# Patient Record
Sex: Female | Born: 1940
Health system: Southern US, Community
[De-identification: ages and names within clinical notes are randomized; demographics above are authoritative.]

## PROBLEM LIST (undated history)

## (undated) DIAGNOSIS — E785 Hyperlipidemia, unspecified: Secondary | ICD-10-CM

## (undated) DIAGNOSIS — Z91018 Allergy to other foods: Secondary | ICD-10-CM

## (undated) DIAGNOSIS — I10 Essential (primary) hypertension: Secondary | ICD-10-CM

## (undated) DIAGNOSIS — G039 Meningitis, unspecified: Secondary | ICD-10-CM

## (undated) HISTORY — PX: FOOT SURGERY: SHX648

## (undated) HISTORY — DX: Hyperlipidemia, unspecified: E78.5

## (undated) HISTORY — PX: BACK SURGERY: SHX140

## (undated) HISTORY — PX: EYE SURGERY: SHX253

## (undated) HISTORY — DX: Essential (primary) hypertension: I10

## (undated) HISTORY — DX: Meningitis, unspecified: G03.9

## (undated) HISTORY — DX: Allergy to other foods: Z91.018

---

## 1972-04-23 HISTORY — PX: TUBAL LIGATION: SHX77

## 1990-04-23 HISTORY — PX: FOOT SURGERY: SHX648

## 1992-04-23 HISTORY — PX: VESICOVAGINAL FISTULA CLOSURE W/ TAH: SUR271

## 2004-03-06 HISTORY — PX: OTHER SURGICAL HISTORY: SHX169

## 2008-04-23 HISTORY — PX: VITRECTOMY: SHX106

## 2009-02-18 HISTORY — PX: CHOLECYSTECTOMY: SHX55

## 2009-04-23 HISTORY — PX: CATARACT EXTRACTION: SUR2

## 2009-07-13 ENCOUNTER — Ambulatory Visit: Payer: Self-pay | Admitting: Pulmonary Disease

## 2009-07-13 DIAGNOSIS — J984 Other disorders of lung: Secondary | ICD-10-CM | POA: Insufficient documentation

## 2009-07-13 DIAGNOSIS — I10 Essential (primary) hypertension: Secondary | ICD-10-CM

## 2009-07-13 DIAGNOSIS — E785 Hyperlipidemia, unspecified: Secondary | ICD-10-CM

## 2009-07-13 HISTORY — DX: Essential (primary) hypertension: I10

## 2009-07-15 ENCOUNTER — Telehealth: Payer: Self-pay | Admitting: Internal Medicine

## 2009-07-20 ENCOUNTER — Ambulatory Visit (HOSPITAL_COMMUNITY): Admission: RE | Admit: 2009-07-20 | Discharge: 2009-07-20 | Payer: Self-pay | Admitting: Pulmonary Disease

## 2009-07-22 ENCOUNTER — Ambulatory Visit: Payer: Self-pay | Admitting: Pulmonary Disease

## 2009-07-22 DIAGNOSIS — R0602 Shortness of breath: Secondary | ICD-10-CM | POA: Insufficient documentation

## 2009-07-22 DIAGNOSIS — J209 Acute bronchitis, unspecified: Secondary | ICD-10-CM | POA: Insufficient documentation

## 2009-10-06 ENCOUNTER — Telehealth: Payer: Self-pay | Admitting: Pulmonary Disease

## 2009-10-25 ENCOUNTER — Encounter: Payer: Self-pay | Admitting: Pulmonary Disease

## 2009-11-02 ENCOUNTER — Encounter: Payer: Self-pay | Admitting: Pulmonary Disease

## 2009-12-06 ENCOUNTER — Encounter: Payer: Self-pay | Admitting: Critical Care Medicine

## 2009-12-09 ENCOUNTER — Ambulatory Visit: Payer: Self-pay | Admitting: Cardiology

## 2009-12-12 ENCOUNTER — Ambulatory Visit: Payer: Self-pay | Admitting: Pulmonary Disease

## 2009-12-15 DIAGNOSIS — E049 Nontoxic goiter, unspecified: Secondary | ICD-10-CM

## 2009-12-15 HISTORY — DX: Nontoxic goiter, unspecified: E04.9

## 2010-05-01 ENCOUNTER — Ambulatory Visit: Payer: Self-pay | Admitting: Cardiology

## 2010-05-01 DIAGNOSIS — R072 Precordial pain: Secondary | ICD-10-CM

## 2010-05-14 ENCOUNTER — Encounter: Payer: Self-pay | Admitting: Pulmonary Disease

## 2010-05-17 ENCOUNTER — Ambulatory Visit
Admission: RE | Admit: 2010-05-17 | Discharge: 2010-05-17 | Payer: Self-pay | Source: Home / Self Care | Attending: Pulmonary Disease | Admitting: Pulmonary Disease

## 2010-05-23 ENCOUNTER — Ambulatory Visit
Admission: RE | Admit: 2010-05-23 | Discharge: 2010-05-23 | Payer: Self-pay | Source: Home / Self Care | Attending: Pulmonary Disease | Admitting: Pulmonary Disease

## 2010-05-23 NOTE — Letter (Signed)
Summary: [facility]   Imported By: Sherian Rein 11/01/2009 09:13:15  _____________________________________________________________________  External Attachment:    Type:   Image     Comment:   External Document

## 2010-05-23 NOTE — Procedures (Signed)
Summary: EGD/[facility]  EGD/[facility]   Imported By: Sherian Rein 01/10/2010 11:03:36  _____________________________________________________________________  External Attachment:    Type:   Image     Comment:   External Document

## 2010-05-23 NOTE — Assessment & Plan Note (Signed)
Summary: rov after ct/apc   Visit Type:  Follow-up Copy to:  Dr. Lucila Maine Primary Provider/Referring Provider:  Dr. Lucila Maine  CC:  Pt here for follow up after CT.  History of Present Illness: Initial OV 07/13/09 69/F referred for evaluation of pulmonary nodule. I have reviewed all images - CXRs dated 7/03, 10/06, 11/07, 10/10 (gall bladder surgery) which are essentially nml. She reports dyspnea x 1 year especially on climbing  asteep hill. CXr on 2/21 /11 performed for this reason showed a RUL nodular infiltrate. Ct chest in 3/11 ([place]) showed 2.7 x 2.1 cm RUl spiculated ill defined nodular infiltrate . She denies fevers, weight loss, hemoptysis or viral prodrome. She reports occasional cough with white phlegm that she has attributed to allergies. She has been on nitrofurantoin for at least a year after bladder tack surgery.  July 22, 2009 9:17 AM  Pet scan reviewed >> low SUV 3.0 uptake. Pulm arteries & heart appear enlarged. Reports neg cath in 2005  c/o cough with yellow phlegm, runny nose, sinus headaches -->  azithro  December 12, 2009 1:51 PM  No new symptoms, exercise cardiac stress test neg for ischemia 7/11. Reviewed CT chest - RUL mucoid impaction unchanged - new ground glass nodule 1.0 x 0.7 cm LUL, nodular thyroid  Preventive Screening-Counseling & Management  Alcohol-Tobacco     Smoking Status: never  Current Medications (verified): 1)  Aspirin 81 Mg  Tabs (Aspirin) .... Take 1 Tablet By Mouth Once A Day 2)  Simvastatin 20 Mg Tabs (Simvastatin) .... Take 1 Tab By Mouth At Bedtime 3)  Omeprazole 40 Mg Cpdr (Omeprazole) .... Take 1 Tablet By Mouth Two Times A Day 4)  Tylenol Extra Strength 500 Mg Tabs (Acetaminophen) .... As Needed For Pain 5)  Klor-Con M10 10 Meq Cr-Tabs (Potassium Chloride Crys Cr) .... Take 1 Tablet By Mouth Once A Day 6)  Triamterene-Hctz 37.5-25 Mg Tabs (Triamterene-Hctz) .... Take 1 Tablet By Mouth Once A Day 7)  Flomax 0.4 Mg Caps  (Tamsulosin Hcl) .... Take 1 Tablet By Mouth Once A Day 8)  Nitrofurantoin Macrocrystal 100 Mg Caps (Nitrofurantoin Macrocrystal) .... Take 1 Tab By Mouth At Bedtime 9)  Co-Enzyme Q-10 100 Mg Caps (Coenzyme Q10) .... Take 1 Tablet By Mouth Two Times A Day 10)  Multivitamins   Tabs (Multiple Vitamin) .... Take 1 Tablet By Mouth Once A Day 11)  Calcium 600+d 600-200 Mg-Unit Tabs (Calcium Carbonate-Vitamin D) .... Take 1 Tablet By Mouth Once A Day 12)  35 Billion Probiotic .... Take 1 Tablet By Mouth Once A Day 13)  Super B Complex  Tabs (B Complex-C) .... Take 1 Tablet By Mouth Once A Day 14)  Metamucil Smooth Texture 58.6 % Powd (Psyllium) .Marland Kitchen.. 1tablespoon Daily 15)  Estrace 0.1 Mg/gm Crea (Estradiol) .... Monday, Wed, Friday  Allergies: 1)  ! Biaxin 2)  ! * Tequin 3)  ! Betadine 4)  ! Iodine 5)  ! 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   Review of Systems  The patient denies anorexia, fever, weight loss, weight gain, vision loss, decreased hearing, hoarseness, chest pain, syncope, dyspnea on exertion, peripheral edema, prolonged cough, headaches, hemoptysis, abdominal pain, melena, hematochezia, severe indigestion/heartburn, hematuria, muscle weakness, suspicious skin lesions, difficulty walking, depression, unusual weight change, and abnormal bleeding.  Vital Signs:  Patient profile:   70 year old female Height:      65.5 inches Weight:      169 pounds BMI:     27.80 O2 Sat:      98 % on Room air Temp:     97.9 degrees F oral Pulse rate:   84 / minute BP sitting:   148 / 70  (right arm) Cuff size:   regular  Vitals Entered By: Zackery Barefoot CMA (December 12, 2009 1:27 PM)  O2 Flow:  Room air  CC: Pt here for follow  up after CT Comments Medications reviewed with patient Verified contact number and pharmacy with patient Zackery Barefoot CMA  December 12, 2009 1:27 PM    Physical Exam  Additional Exam:  Gen. Pleasant, well-nourished, in no distress, normal affect ENT - no lesions, no post nasal drip Neck: No JVD, no thyromegaly, no carotid bruits Lungs: no use of accessory muscles, no dullness to percussion, clear without rales or rhonchi  Cardiovascular: Rhythm regular, heart sounds  normal, no murmurs or gallops, no peripheral edema Musculoskeletal: No deformities, no cyanosis or clubbing      CT of Chest  Procedure date:  12/09/2009  Findings:      IMPRESSION:   1.  No interval change in the tubular/raising opacity in the right upper lobe with a 'finger and left' appearance.  This may be seen with mucoid impaction and/or ABPA (Aspergillus) infection. 2.  Interval enlargement of a ground-glass nodule in the left upper lobe which is concerning for malignancy.  If a biopsy is not planned, a follow-up chest CT is recommended in 3 months. No adenopathy is seen. 3.  Hiatal hernia. 4. Unchanged nodular appearance of the thyroid.  Impression & Recommendations:  Problem # 1:  PULMONARY NODULE (ICD-518.89) Unclear significane of new lUL nodule in tihs never smoker - this is too small to be biopsied & best approach would be wait & reassess - if symptomatic may consider bronchoscopy with BAL. Likely same process as in RUL causing mucoid impaction Orders: Radiology Referral (Radiology) Est. Patient Level III (21308)  Problem # 2:  DYSPNEA (ICD-786.05) spirometry s/o mild restriction rather than obstruction. Wonder if we have to [facility] for PAH - echo done by cards & told nml  Problem # 3:  GOITER, UNSPECIFIED (ICD-240.9)  Nodular thyroid noted on CT - defer WU to PMD  Orders: Est. Patient Level III (65784)  Medications Added to Medication List This Visit: 1)  Aspirin 81 Mg Tabs  (Aspirin) .... Take 1 tablet by mouth once a day 2)  Omeprazole 40 Mg Cpdr (Omeprazole) .... Take 1 tablet by mouth two times a day 3)  Metamucil Smooth Texture 58.6 % Powd (Psyllium) .Marland Kitchen.. 1tablespoon daily  Patient Instructions: 1)  Copy sent to:dr scott 2)  Please schedule a follow-up appointment in 4 months after CT scan 3)  A Chest CT WITHOUT Contrast has been recommended in 4 months . Your imaging study may require preauthorization.  4)  You have a nodular thyroid. 5)  Call earlier if you have a fever or cough that won't go away.

## 2010-05-23 NOTE — Procedures (Signed)
Summary:  [facility]   Imported By: Sherian Rein 01/10/2010 11:04:41  _____________________________________________________________________  External Attachment:    Type:   Image     Comment:   External Document

## 2010-05-23 NOTE — Progress Notes (Signed)
Summary: FYI---name of cardiologists  Phone Note Call from Patient   Caller: Patient Call For: Denaya Horn Summary of Call: pt calling to give the names of heart drs which is dr Sherril Croon and dr Tomie China Initial call taken by: Rickard Patience,  October 06, 2009 9:29 AM  Follow-up for Phone Call        called and spoke with pt.  pt states this is just an FYI for Dr. Vassie Loll. Pt just wanted Dr. Vassie Loll to know who here cardiologists are.  Looks like per append to 07-22-2009 OV, RA was wanting cardiology records but pt at the time didn't know who she's seen regarding her heart. Will forward this message to Chryl Heck so she can get records.   Arman Filter LPN  October 06, 2009 9:34 AM   Additional Follow-up for Phone Call Additional follow up Details #1::        Called the above offices and requested notes. I was advised by both offices notes will be faxed. Zackery Barefoot CMA  October 27, 2009 4:32 PM   Dr. Sherril Croon [phone] Dr. Glean Hess Revankar [phone] Zackery Barefoot CMA  October 27, 2009 4:33 PM

## 2010-05-23 NOTE — Assessment & Plan Note (Signed)
Summary: FOLLOW UP/[facility]   Visit Type:  Follow-up Copy to:  Dr. Lucila Maine Primary Provider/Referring Provider:  Dr. Lucila Maine  CC:  Pt here for follow up on PET Scan. Pt c/o productive cough last night with white to yellow mucus and head congestion.  History of Present Illness: 68/F referred for evaluation of pulmonary nodule. I have reviewed all images - CXRs dated 7/03, 10/06, 11/07, 10/10 (gall bladder surgery) which are essentially nml. She reports dyspnea x 1 year especially on climbing  asteep hill. CXr on 2/21 /11 performed for this reason showed a RUL nodular infiltrate. Ct chest in 3/11 ([place]) showed 2.7 x 2.1 cm RUl spiculated ill defined nodular infiltrate . She denies fevers, weight loss, hemoptysis or viral prodrome. She reports occasional cough with white phlegm that she has attributed to allergies. She has been on nitrofurantoin for at least a year after bladder tack surgery.  July 22, 2009 9:17 AM  Pet scan reviewed >> low SUV 3.0 uptake. Pulm arteries & heart appear enlarged. Reports neg cath in 2005  c/o cough with yellow phlegm, runny nose, sinus headaches  Current Medications (verified): 1)  Aspirin 325 Mg  Tabs (Aspirin) .... Take One Tablet By Mouth Every Other Day 2)  Simvastatin 20 Mg Tabs (Simvastatin) .... Take 1 Tab By Mouth At Bedtime 3)  Omeprazole 40 Mg Cpdr (Omeprazole) .... Take 1 Tablet By Mouth Once A Day 4)  Tylenol Extra Strength 500 Mg Tabs (Acetaminophen) .... As Needed For Pain 5)  Klor-Con M10 10 Meq Cr-Tabs (Potassium Chloride Crys Cr) .... Take 1 Tablet By Mouth Once A Day 6)  Triamterene-Hctz 37.5-25 Mg Tabs (Triamterene-Hctz) .... Take 1 Tablet By Mouth Once A Day 7)  Flomax 0.4 Mg Caps (Tamsulosin Hcl) .... Take 1 Tablet By Mouth Once A Day 8)  Nitrofurantoin Macrocrystal 100 Mg Caps (Nitrofurantoin Macrocrystal) .... Take 1 Tab By Mouth At Bedtime 9)  Co-Enzyme Q-10 100 Mg Caps (Coenzyme Q10) .... Take 1 Tablet By Mouth Two Times A  Day 10)  Multivitamins   Tabs (Multiple Vitamin) .... Take 1 Tablet By Mouth Once A Day 11)  Calcium 600+d 600-200 Mg-Unit Tabs (Calcium Carbonate-Vitamin D) .... Take 1 Tablet By Mouth Once A Day 12)  35 Billion Probiotic .... Take 1 Tablet By Mouth Once A Day 13)  Super B Complex  Tabs (B Complex-C) .... Take 1 Tablet By Mouth Once A Day 14)  Metamucil Smooth Texture 58.6 % Powd (Psyllium) .Marland Kitchen.. 1 Scoop Daily 15)  Estrace 0.1 Mg/gm Crea (Estradiol) .... Monday, Wed, Friday 16)  Nasal Spray Saline .... Three Times A Day As Needed 17)  Furosemide 20 Mg Tabs (Furosemide) .... Take 1 Tablet By Mouth Every Morning As Needed 18)  Prednisone 10 Mg Tabs (Prednisone) .... Taper As Needed For Iv Dye (13 Hours Prior To Scan) 19)  Benadryl 25 Mg Caps (Diphenhydramine Hcl) .... Prior To Scan With Iv Dye  Allergies (verified): 1)  ! Biaxin 2)  ! * Tequin 3)  ! Betadine 4)  ! 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   Review of Systems       The patient complains of dyspnea on exertion.  The patient denies anorexia, fever, weight loss, weight gain, vision loss, decreased hearing, hoarseness, chest pain, syncope, peripheral edema, prolonged  cough, headaches, hemoptysis, abdominal pain, melena, hematochezia, severe indigestion/heartburn, hematuria, muscle weakness, suspicious skin lesions, difficulty walking, depression, unusual weight change, and abnormal bleeding.    Vital Signs:  Patient profile:   70 year old female Height:      65.5 inches Weight:      172.13 pounds O2 Sat:      98 % on Room air Temp:     98.0 degrees F oral Pulse rate:   90 / minute BP sitting:   140 / 86  (left arm) Cuff size:   regular  Vitals Entered By: Zackery Barefoot CMA  (July 22, 2009 9:07 AM)  O2 Flow:  Room air CC: Pt here for follow up on PET Scan. Pt c/o productive cough last night with white to yellow mucus, head congestion Comments Medications reviewed with patient Verified contact number and pharmacy with patient Zackery Barefoot CMA  July 22, 2009 9:08 AM    Physical Exam  Additional Exam:  Gen. Pleasant, well-nourished, in no distress, normal affect ENT - no lesions, no post nasal drip Neck: No JVD, no thyromegaly, no carotid bruits Lungs: no use of accessory muscles, no dullness to percussion, clear without rales or rhonchi  Cardiovascular: Rhythm regular, heart sounds  normal, no murmurs or gallops, no peripheral edema Musculoskeletal: No deformities, no cyanosis or clubbing      Imaging Exam  Procedure date:  07/20/2009  Findings:      IMPRESSION:   1.  There is mild hypermetabolism associated with linear soft tissue and volume loss in the right upper lobe.  A resolving infectious process is queried.  Comparison with old exams would be most helpful, as malignancy cannot be definitively excluded. 2.  Heterogeneous thyroid with diffuse but mildly patchy uptake. This can be seen with thyroiditis. 3.  Pulmonary arterial hypertension.  Pulmonary Function Test Date: 07/22/2009 09:31 AM Gender: Female  Pre-Spirometry FVC    Value: 2.35 L/min   % Pred: 71.80 % FEV1    Value: 1.76 L     Pred: 2.50 L     % Pred: 70.40 % FEV1/FVC  Value: 74.65 %     % Pred: 97.80 %  Impression & Recommendations:  Problem # 1:  PULMONARY NODULE (ICD-518.89) Low SUv on PET, would recommend serial FU in this never smoker Next CT in Orders: Radiology Referral (Radiology) Est. Patient Level III (40347) Spirometry w/Graph (94010)  Problem # 2:  BRONCHITIS, ACUTE (ICD-466.0)  sino- bronchitis Her updated medication list for this problem includes:    Azithromycin 500 Mg Tabs (Azithromycin) ..... Once daily  Orders: Est. Patient Level  III (42595) Spirometry w/Graph (94010)  Problem # 3:  DYSPNEA (ICD-786.05) spirometry s/o mild restriction rather than obstruction. Wonder if we have to [facility] for [facility]  Medications Added to Medication List This Visit: 1)  Azithromycin 500 Mg Tabs (Azithromycin) .... Once daily  Patient Instructions: 1)  Please schedule a follow-up appointment in 4 months after CT scan 2)  Antibiotic x 5 ds 3)  Breathing test 4)  Copy sent to: Dr Lucila Maine, Rosalita Levan Prescriptions: AZITHROMYCIN 500 MG TABS (AZITHROMYCIN) once daily  #5 x 0   Entered and Authorized by:   Comer Locket Vassie Loll MD   Signed by:   Comer Locket Vassie Loll MD on 07/22/2009   Method used:   [facility]        [facility]       [address]  [place], [place]  [address]       Ph: [phone] or [phone]       Fax: [phone]   RxID:   [id number]    CardioPerfect Spirometry  ID: [id number] Patient: Tina Curry DOB: 04-23-1941 Age: 70 Years Old Sex: Female Race: White Height: 65.5 Weight: 172.13 Status: Unconfirmed Past Medical History:  HYPERTENSION (ICD-401.9) HYPERLIPIDEMIA (ICD-272.4)   Recorded: 07/22/2009 09:31 AM  Parameter  Measured Predicted %Predicted FVC     2.35        3.28        71.80 FEV1     1.76        2.50        70.40 FEV1%   74.65        76.36        97.80 PEF    4.13        6.06        68.10   Interpretation: No obstruction Mild restriction   Appended Document: FOLLOW UP/[facility] pl get last reports from her cardiologist in [place]- dont know name/ or PMD [facility]ott - echo, cath  Appended Document: FOLLOW UP/[facility] Pt states she does not have a cardiologist. Heart cath was done April 2005 @ [facility]. PMD is Lucila Maine [facility]. Faxed request to [facility] medical records

## 2010-05-23 NOTE — Progress Notes (Signed)
Summary: appt  Phone Note Call from Patient Call back at Home Phone [phone]   Caller: Patient Call For: alva Reason for Call: Talk to Nurse Summary of Call: need to schedule appt after PET next week to discuss results - 1st available is 07/29/2009.  Pt doesn't want to wait that long. Initial call taken by: Eugene Gavia,  July 15, 2009 11:47 AM  Follow-up for Phone Call        pt scheduled to have PET scan done 07-19-2009. pt requesting appt with RA after PET scan to discuss results.  First avail isn't until 07-29-2009.  Pt states she doesn't want to wait that long to get results.  Please advise what you recommend.  Thanks.  Aundra Millet Reynolds LPN  July 15, 2009 11:57 AM   ok to double book on wed 3/30 -9 am Follow-up by: Comer Locket. Vassie Loll MD,  July 15, 2009 2:06 PM  Additional Follow-up for Phone Call Additional follow up Details #1::        pt schedueld to see RA on 3/30 at 9am. Carron Curie CMA  July 15, 2009 2:54 PM

## 2010-05-23 NOTE — Assessment & Plan Note (Signed)
Summary: CHEST MASS///kp PT WAS REQUESTING A SOONER APPT/CB   Visit Type:  Initial Consult Copy to:  Dr. Lucila Maine Primary Provider/Referring Provider:  Dr. Lucila Maine  CC:  Pt here for pulmonary consult. Pt c/o S.O.B with exertion since summer of 2010. Pt c/o abnormal x-ray .  History of Present Illness: 68/F referred for evaluation of pulmonary nodule. I have reviewed all images - CXRs dated 7/03, 10/06, 11/07, 10/10 (gall bladder surgery) which are essentially nml. She reports dyspnea x 1 year especially on climbing  asteep hill. CXr on 2/21 /11 performed for this reason showed a RUL nodular infiltrate. Ct chest in 3/11 ([place]) showed 2.7 x 2.1 cm RUl spiculated ill defined nodular infiltrate . She denies fevers, weight loss, hemoptysis or viral prodrome. She reports occasional cough with white phlegm that she has attributed to allergies. She has been on nitrofurantoin for at least a year after bladder tack surgery.  Preventive Screening-Counseling & Management  Alcohol-Tobacco     Smoking Status: never  Current Medications (verified): 1)  Aspirin 325 Mg  Tabs (Aspirin) .... Take One Tablet By Mouth Every Other Day 2)  Simvastatin 20 Mg Tabs (Simvastatin) .... Take 1 Tab By Mouth At Bedtime 3)  Omeprazole 40 Mg Cpdr (Omeprazole) .... Take 1 Tablet By Mouth Once A Day 4)  Tylenol Extra Strength 500 Mg Tabs (Acetaminophen) .... As Needed For Pain 5)  Klor-Con M10 10 Meq Cr-Tabs (Potassium Chloride Crys Cr) .... Take 1 Tablet By Mouth Once A Day 6)  Triamterene-Hctz 37.5-25 Mg Tabs (Triamterene-Hctz) .... Take 1 Tablet By Mouth Once A Day 7)  Flomax 0.4 Mg Caps (Tamsulosin Hcl) .... Take 1 Tablet By Mouth Once A Day 8)  Nitrofurantoin Macrocrystal 100 Mg Caps (Nitrofurantoin Macrocrystal) .... Take 1 Tab By Mouth At Bedtime 9)  Co-Enzyme Q-10 100 Mg Caps (Coenzyme Q10) .... Take 1 Tablet By Mouth Two Times A Day 10)  Multivitamins   Tabs (Multiple Vitamin) .... Take 1 Tablet By  Mouth Once A Day 11)  Calcium 600+d 600-200 Mg-Unit Tabs (Calcium Carbonate-Vitamin D) .... Take 1 Tablet By Mouth Once A Day 12)  35 Billion Probiotic .... Take 1 Tablet By Mouth Once A Day 13)  Super B Complex  Tabs (B Complex-C) .... Take 1 Tablet By Mouth Once A Day 14)  Metamucil Smooth Texture 58.6 % Powd (Psyllium) .Marland Kitchen.. 1 Scoop Daily 15)  Estrace 0.1 Mg/gm Crea (Estradiol) .... Monday, Wed, Friday 16)  Nasal Spray Saline .... Three Times A Day As Needed 17)  Furosemide 20 Mg Tabs (Furosemide) .... Take 1 Tablet By Mouth Every Morning As Needed 18)  Prednisone 10 Mg Tabs (Prednisone) .... Taper As Needed For Iv Dye (13 Hours Prior To Scan) 19)  Benadryl 25 Mg Caps (Diphenhydramine Hcl) .... Prior To Scan With Iv Dye  Allergies (verified): 1)  ! Biaxin 2)  ! * Tequin 3)  ! Betadine 4)  ! Iodine  Past History:  Past Medical History: HYPERTENSION (ICD-401.9) HYPERLIPIDEMIA (ICD-272.4)    Past Surgical History: Cholecystectomy 02-18-2009 Back surgery  1990 and 2001 Hysterectomy 1994 Tubal Ligation 1974 Bladder Tack-March 06, 2004 Left foot surgery Left eye surgery   Family History: Family History MI/Heart Attack-mother, father Immunologist, sister  Social History: Marital Status: married, lives with Fredrik Cove Westerhold (husband) Children: yes Occupation: housewife Patient never smoked.  Smoking Status:  never  Review of Systems       The patient complains of shortness of breath with activity, productive  cough, acid heartburn, indigestion, sore throat, hand/feet swelling, and joint stiffness or pain.  The patient denies shortness of breath at rest, non-productive cough, coughing up blood, chest pain, irregular heartbeats, loss of appetite, weight change, abdominal pain, difficulty swallowing, tooth/dental problems, headaches, nasal congestion/difficulty breathing through nose, sneezing, itching, ear ache, anxiety, depression, rash, change in color of mucus, and fever.     Vital Signs:  Patient profile:   70 year old female Height:      65.5 inches Weight:      170.50 pounds BMI:     28.04 O2 Sat:      97 % on Room air Temp:     97.9 degrees F oral Pulse rate:   86 / minute BP sitting:   142 / 90  (left arm) Cuff size:   regular  Vitals Entered By: Zackery Barefoot CMA (July 13, 2009 5:05 PM)  O2 Flow:  Room air CC: Pt here for pulmonary consult. Pt c/o S.O.B with exertion since summer of 2010. Pt c/o abnormal x-ray  Comments Medications reviewed with patient Verified contact number and pharmacy with patient Zackery Barefoot CMA  July 13, 2009 5:07 PM    Physical Exam  Additional Exam:  Gen. Pleasant, well-nourished, in no distress, normal affect ENT - no lesions, no post nasal drip Neck: No JVD, no thyromegaly, no carotid bruits Lungs: no use of accessory muscles, no dullness to percussion, clear without rales or rhonchi  Cardiovascular: Rhythm regular, heart sounds  normal, no murmurs or gallops, no peripheral edema Abdomen: soft and non-tender, no hepatosplenomegaly, BS normal. Musculoskeletal: No deformities, no cyanosis or clubbing Neuro:  alert, non focal     Impression & Recommendations:  Problem # 1:  PULMONARY NODULE (ICD-518.89) Discussed options including serial FU vs work up now. Pet scan will be scheduled. If high AUV uptake, will pusue further diagnostic biopsy. If negative, will take wait & watch approach. The differential diagnosis was discussed. Orders: Radiology Referral (Radiology) Consultation Level III [phone])  Medications Added to Medication List This Visit: 1)  Aspirin 325 Mg Tabs (Aspirin) .... Take one tablet by mouth every other day 2)  Simvastatin 20 Mg Tabs (Simvastatin) .... Take 1 tab by mouth at bedtime 3)  Omeprazole 40 Mg Cpdr (Omeprazole) .... Take 1 tablet by mouth once a day 4)  Tylenol Extra Strength 500 Mg Tabs (Acetaminophen) .... As needed for pain 5)  Klor-con M10 10 Meq Cr-tabs (Potassium  chloride crys cr) .... Take 1 tablet by mouth once a day 6)  Triamterene-hctz 37.5-25 Mg Tabs (Triamterene-hctz) .... Take 1 tablet by mouth once a day 7)  Flomax 0.4 Mg Caps (Tamsulosin hcl) .... Take 1 tablet by mouth once a day 8)  Nitrofurantoin Macrocrystal 100 Mg Caps (Nitrofurantoin macrocrystal) .... Take 1 tab by mouth at bedtime 9)  Co-enzyme Q-10 100 Mg Caps (Coenzyme q10) .... Take 1 tablet by mouth two times a day 10)  Multivitamins Tabs (Multiple vitamin) .... Take 1 tablet by mouth once a day 11)  Calcium 600+d 600-200 Mg-unit Tabs (Calcium carbonate-vitamin d) .... Take 1 tablet by mouth once a day 12)  35 Billion Probiotic  .... Take 1 tablet by mouth once a day 13)  Super B Complex Tabs (B complex-c) .... Take 1 tablet by mouth once a day 14)  Metamucil Smooth Texture 58.6 % Powd (Psyllium) .Marland Kitchen.. 1 scoop daily 15)  Estrace 0.1 Mg/gm Crea (Estradiol) .... Monday, wed, friday 16)  Nasal Spray Saline  .... Three  times a day as needed 17)  Furosemide 20 Mg Tabs (Furosemide) .... Take 1 tablet by mouth every morning as needed 18)  Prednisone 10 Mg Tabs (Prednisone) .... Taper as needed for iv dye (13 hours prior to scan) 19)  Benadryl 25 Mg Caps (Diphenhydramine hcl) .... Prior to scan with iv dye  Patient Instructions: 1)  Copy sent to:Dr Scott 2)  Please schedule a follow-up appointment after your PET scan. 3)  A whole body PET Scan has been recommended.  Your imaging study may require preauthorization.   Appended Document: CHEST MASS///kp PT WAS REQUESTING A SOONER APPT/CB reviewed cardiac data >> inconclusive nuclear stress test, cath nml coronaries,nml LV fn

## 2010-05-25 LAB — CULTURE, RESPIRATORY W GRAM STAIN

## 2010-05-25 NOTE — Op Note (Signed)
  NAME:  Tina Curry, Tina Curry                   ACCOUNT NO.:  [id number]  MEDICAL RECORD NO.:  [id number]          PATIENT TYPE:  AMB  LOCATION:  CARD                         FACILITY:  [facility]  PHYSICIAN:  Oretha Milch, MD      DATE OF BIRTH:  02/22/1941  DATE OF PROCEDURE:  05/23/2010 DATE OF DISCHARGE:                              OPERATIVE REPORT   PROCEDURE PERFORMED:  Video bronchoscopy with biopsies.  INDICATIONS FOR PROCEDURE:  Persistent right upper lobe nodule in this 70 year old never smoker, on chronic nitrofurantoin for UTIs.  Written informed consent was obtained from the patient prior to the procedure.  Risks of the procedure including coughing, bleeding, and a small chance of lung puncture requiring chest tube were discussed and she evidenced understanding.  DESCRIPTION OF PROCEDURE:  A 3 mg of Versed and 125 mcg of fentanyl in divided doses were used during the procedure.  Bronchoscope was introduced from the right nare.  Upper airway appeared normal.  Vocal cords showed normal appearance and motion.  The tracheobronchial tree was inspected to the subsegmental level.  No endobronchial lesions were noted.  The bronchial mucosa looked mildly inflamed.  Attention was then turned to right upper lobe.  Bronchoalveolar lavage was obtained from the apical and anterior subsegments of the right upper lobe.  Forceps was then introduced in the apical subsegment of the right upper lobe and transbronchial biopsies x2 were obtained.  After the second biopsy, there was minimal bleeding about 5 cc.  The procedure was terminated at this point.  Hemostasis was obtained with the tip of the scope and the right upper lobe was inspected to ensure stopping of bleeding prior to withdrawal of the scope.  The patient was awake right after, a chest x- ray will be performed to rule out presence of pneumothorax.     Oretha Milch, MD     RVA/MEDQ  D:  05/23/2010  T:  05/23/2010  Job:   [id number]  Electronically Signed by Cyril Mourning MD on 05/25/2010 03:51:49 PM

## 2010-05-25 NOTE — Assessment & Plan Note (Signed)
Summary: f/u CT results///kp   Visit Type:  Follow-up Copy to:  Dr. Lucila Maine Primary Provider/Referring Provider:  Dr. Lucila Maine  CC:  Follow up on CT. Pt c/o DOE, lightheaded, "no energy", and productive cough with white mucus.  History of Present Illness: Initial OV 07/13/09 69/F referred for evaluation of pulmonary nodule. I have reviewed all images - CXRs dated 7/03, 10/06, 11/07, 10/10 (gall bladder surgery) which are essentially nml. She reports dyspnea x 1 year especially on climbing  asteep hill. CXr on 2/21 /11 performed for this reason showed a RUL nodular infiltrate. Ct chest in 3/11 ([place]) showed 2.7 x 2.1 cm RUl spiculated ill defined nodular infiltrate . She reports occasional cough with white phlegm that she has attributed to allergies. She has been on nitrofurantoin for at least a year after bladder tack surgery.  July 22, 2009 9:17 AM  Pet scan reviewed >> low SUV 3.0 uptake. Pulm arteries & heart appear enlarged. Reports neg cath in 2005  c/o cough with yellow phlegm, runny nose, sinus headaches -->  azithro  December 12, 2009 1:51 PM  No new symptoms, exercise cardiac stress test neg for ischemia 7/11. Reviewed CT chest - RUL mucoid impaction unchanged - new ground glass nodule 1.0 x 0.7 cm LUL, nodular thyroid  May 17, 2010 2:11 PM  On nitrofurantoin x 2 yrs- dr cham-urologist Rpt chest CT shows more prominent RUL nodule with surrounding smaller nodules ? infectious No new symptoms  Preventive Screening-Counseling & Management  Alcohol-Tobacco     Smoking Status: never  Current Medications (verified): 1)  Aspirin 81 Mg  Tabs (Aspirin) .... Take 1 Tablet By Mouth Once A Day 2)  Simvastatin 20 Mg Tabs (Simvastatin) .... Take 1 Tab By Mouth At Bedtime 3)  Omeprazole 40 Mg Cpdr (Omeprazole) .... Take 1 Tablet By Mouth Two Times A Day 4)  Tylenol Extra Strength 500 Mg Tabs (Acetaminophen) .... As Needed For Pain 5)  Klor-Con M10 10 Meq Cr-Tabs  (Potassium Chloride Crys Cr) .... Take 1 Tablet By Mouth Once A Day 6)  Triamterene-Hctz 37.5-25 Mg Tabs (Triamterene-Hctz) .... Take 1 Tablet By Mouth Once A Day 7)  Flomax 0.4 Mg Caps (Tamsulosin Hcl) .... Take 1 Tablet By Mouth Once A Day 8)  Nitrofurantoin Macrocrystal 100 Mg Caps (Nitrofurantoin Macrocrystal) .... Take 1 Tab By Mouth At Bedtime 9)  Co-Enzyme Q-10 100 Mg Caps (Coenzyme Q10) .... Take 1 Tablet By Mouth Two Times A Day 10)  Multivitamins   Tabs (Multiple Vitamin) .... Take 1 Tablet By Mouth Once A Day 11)  Calcium 600+d 600-200 Mg-Unit Tabs (Calcium Carbonate-Vitamin D) .... Take 1 Tablet By Mouth Once A Day 12)  35 Billion Probiotic .... Take 1 Tablet By Mouth Once A Day 13)  Super B Complex  Tabs (B Complex-C) .... Take 1 Tablet By Mouth Once A Day 14)  Metamucil Smooth Texture 58.6 % Powd (Psyllium) .Marland Kitchen.. 1tablespoon Daily 15)  Estrace 0.1 Mg/gm Crea (Estradiol) .... Monday, Wed, Friday 16)  Metoprolol Succinate 50 Mg Xr24h-Tab (Metoprolol Succinate) .... Take 1 Tablet By Mouth Once A Day  Allergies (verified): 1)  ! Biaxin 2)  ! * Tequin 3)  ! Betadine 4)  ! Iodine 5)  ! 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   Review  of Systems  The patient denies anorexia, fever, weight loss, weight gain, vision loss, decreased hearing, hoarseness, chest pain, syncope, dyspnea on exertion, peripheral edema, prolonged cough, headaches, hemoptysis, abdominal pain, melena, hematochezia, severe indigestion/heartburn, hematuria, muscle weakness, suspicious skin lesions, difficulty walking, depression, unusual weight change, abnormal bleeding, enlarged lymph nodes, and angioedema.    Vital Signs:  Patient profile:   70 year old  female Height:      65.5 inches Weight:      181.4 pounds BMI:     29.83 O2 Sat:      96 % on Room air Temp:     98.1 degrees F oral Pulse rate:   81 / minute BP sitting:   132 / 76  (left arm) Cuff size:   regular  Vitals Entered By: Zackery Barefoot CMA (May 17, 2010 2:00 PM)  O2 Flow:  Room air CC: Follow up on CT. Pt c/o DOE, lightheaded, "no energy", productive cough with white mucus Comments Medications reviewed with patient Verified contact number and pharmacy with patient Zackery Barefoot CMA  May 17, 2010 2:00 PM    Physical Exam  Additional Exam:  Gen. Pleasant, well-nourished, in no distress, normal affect ENT - no lesions, no post nasal drip Neck: No JVD, no thyromegaly, no carotid bruits Lungs: no use of accessory muscles, no dullness to percussion, clear without rales or rhonchi  Cardiovascular: Rhythm regular, heart sounds  normal, no murmurs or gallops, no peripheral edema Musculoskeletal: No deformities, no cyanosis or clubbing      CT of Chest  Procedure date:  05/01/2010  Findings:      IMPRESSION: Scattered peribronchovascular nodularity, some of which is new or progressive from 12/09/2009.  Increase in prominence  of irregular right upper lobe nodule, with surrounding smaller nodules. Collectively, a smoldering low-grade atypical infectious process could have this appearance.  However, given the slow growth of the right upper lobe lesion, malignancy must remain a consideration. Follow-up in 3 months is recommended.    Impression & Recommendations:  Problem # 1:  PULMONARY NODULE (ICD-518.89)  LUL process has resolved, but RUL nodule appears more prominent Possibly related to nitrofurantoin or infectious process Will proceed with BAL, if neg then continued surveillance The risks of bronshoscopy including coughing, bleeding and the small chance of lung puncture requiring chest tube were discussed in great detail. The benefits &  alternatives including serial follow up were also discussed.  Orders: Est. Patient Level III (16109)  Medications Added to Medication List This Visit: 1)  Metoprolol Succinate 50 Mg Xr24h-tab (Metoprolol succinate) .... Take 1 tablet by mouth once a day  Patient Instructions: 1)  Copy sent to: Dr Lorin Picket 2)  Please schedule a follow-up appointment in 2 weeks. 3)  You will be scheduled for a bronchoscopy at : Tuesday jan 31st at 7Am 4)  Nothing to eat after MN, take your BP medications   Immunization History:  Influenza Immunization History:    Influenza:  historical (01/09/2010)  Pneumovax Immunization History:    Pneumovax:  historical (01/25/2009)

## 2010-05-29 ENCOUNTER — Ambulatory Visit (INDEPENDENT_AMBULATORY_CARE_PROVIDER_SITE_OTHER): Payer: Medicare Other | Admitting: Pulmonary Disease

## 2010-05-29 ENCOUNTER — Encounter: Payer: Self-pay | Admitting: Pulmonary Disease

## 2010-05-29 ENCOUNTER — Other Ambulatory Visit: Payer: Self-pay | Admitting: Pulmonary Disease

## 2010-05-29 DIAGNOSIS — R911 Solitary pulmonary nodule: Secondary | ICD-10-CM

## 2010-05-29 DIAGNOSIS — J984 Other disorders of lung: Secondary | ICD-10-CM

## 2010-06-08 NOTE — Assessment & Plan Note (Signed)
Summary: 2 week rov//sh   Visit Type:  Follow-up Copy to:  Dr. Lucila Maine Primary Provider/Referring Provider:  Dr. Lucila Maine  CC:  Follow up post bronchoscopy.  History of Present Illness: Initial OV 07/13/09 69/F referred for evaluation of pulmonary nodule. I have reviewed all images - CXRs dated 7/03, 10/06, 11/07, 10/10 (gall bladder surgery) which are essentially nml. She reports dyspnea x 1 year especially on climbing  asteep hill. CXr on 2/21 /11 performed for this reason showed a RUL nodular infiltrate. Ct chest in 3/11 ([place]) showed 2.7 x 2.1 cm RUl spiculated ill defined nodular infiltrate . She reports occasional cough with white phlegm that she has attributed to allergies. She has been on nitrofurantoin since 2009 after bladder tack surgery. Spirometry showed FVC of 70%, no airway obstruction.  July 22, 2009 9:17 AM  Pet scan reviewed >> low SUV 3.0 uptake. Pulm arteries & heart appear enlarged. Reports neg cath in 2005  December 12, 2009 1:51 PM  Exercise cardiac stress test neg for ischemia 7/11. Reviewed CT chest - RUL mucoid impaction unchanged - new ground glass nodule 1.0 x 0.7 cm LUL, nodular thyroid FU chest CT  jan'12 shows more prominent RUL nodule with surrounding smaller nodules ? infectious  May 29, 2010 1:45 PM  No new symptoms, bronchoscopy results discussed >> cytolgy neg, cx neg, afb / fungal neg  Preventive Screening-Counseling & Management  Alcohol-Tobacco     Smoking Status: never  Current Medications (verified): 1)  Aspirin 81 Mg  Tabs (Aspirin) .... Take 1 Tablet By Mouth Once A Day 2)  Simvastatin 20 Mg Tabs (Simvastatin) .... Take 1 Tab By Mouth At Bedtime 3)  Omeprazole 40 Mg Cpdr (Omeprazole) .... Take 1 Tablet By Mouth One To Two Times A Day 4)  Tylenol Extra Strength 500 Mg Tabs (Acetaminophen) .... As Needed For Pain 5)  Klor-Con M10 10 Meq Cr-Tabs (Potassium Chloride Crys Cr) .... Take 1 Tablet By Mouth Once A Day 6)   Triamterene-Hctz 37.5-25 Mg Tabs (Triamterene-Hctz) .... Take 1 Tablet By Mouth Once A Day 7)  Flomax 0.4 Mg Caps (Tamsulosin Hcl) .... Take 1 Tablet By Mouth Once A Day 8)  Nitrofurantoin Macrocrystal 100 Mg Caps (Nitrofurantoin Macrocrystal) .... Take 1 Tab By Mouth At Bedtime 9)  Multivitamins   Tabs (Multiple Vitamin) .... Take 1 Tablet By Mouth Once A Day 10)  Calcium 600+d 600-200 Mg-Unit Tabs (Calcium Carbonate-Vitamin D) .... Take 1 Tablet By Mouth Once A Day 11)  35 Billion Probiotic .... Take 1 Tablet By Mouth Once A Day 12)  Metamucil Smooth Texture 58.6 % Powd (Psyllium) .Marland Kitchen.. 1tablespoon Daily 13)  Estrace 0.1 Mg/gm Crea (Estradiol) .... Monday, Wed, Friday 14)  Metoprolol Succinate 50 Mg Xr24h-Tab (Metoprolol Succinate) .... Take 1 Tablet By Mouth Once A Day  Allergies (verified): 1)  ! Biaxin 2)  ! * Tequin 3)  ! Betadine 4)  ! Iodine 5)  ! 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   Review of Systems       The patient complains of dyspnea on exertion.  The patient denies anorexia, fever, weight loss, weight gain, vision loss, decreased hearing, hoarseness, chest pain, syncope, peripheral edema, prolonged cough, headaches, hemoptysis, abdominal pain, melena, hematochezia, severe indigestion/heartburn, hematuria, muscle weakness, suspicious skin  lesions, difficulty walking, depression, unusual weight change, abnormal bleeding, enlarged lymph nodes, and angioedema.    Vital Signs:  Patient profile:   70 year old female Height:      65.5 inches Weight:      180.2 pounds BMI:     29.64 O2 Sat:      97 % on Room air Temp:     98.2 degrees F oral Pulse rate:   84 / minute BP sitting:   120 / 74  (left arm) Cuff size:    regular  Vitals Entered By: Zackery Barefoot CMA (May 29, 2010 1:38 PM)  O2 Flow:  Room air CC: Follow up post bronchoscopy Comments Medications reviewed with patient Verified contact number and pharmacy with patient Zackery Barefoot CMA  May 29, 2010 1:38 PM    Physical Exam  Additional Exam:  Gen. Pleasant, well-nourished, in no distress, normal affect ENT - no lesions, no post nasal drip Neck: No JVD, no thyromegaly, no carotid bruits Lungs: no use of accessory muscles, no dullness to percussion, clear without rales or rhonchi  Cardiovascular: Rhythm regular, heart sounds  normal, no murmurs or gallops, no peripheral edema Musculoskeletal: No deformities, no cyanosis or clubbing      Impression & Recommendations:  Problem # 1:  PULMONARY NODULE (ICD-518.89) Negative bscopy findings - Serial FU CT scans q 6 months x  2 years is advised Posibly, nitrofurantoin may be implicated - She will ask urologist if htere is an alternative ? Orders: Est. Patient Level III (16109) Radiology Referral (Radiology)  Medications Added to Medication List This Visit: 1)  Omeprazole 40 Mg Cpdr (Omeprazole) .... Take 1 tablet by mouth one to two times a day  Patient Instructions: 1)  Copy sent to: Dr Lorin Picket, Dr Saddie Benders - fax [phone] 2)  Please schedule a follow-up appointment in 6 months after CT scan 3)  It is possible that the nitrofurantoin is causing scar tissue in your lungs - Is there an alternative

## 2010-06-19 LAB — FUNGUS CULTURE W SMEAR: Fungal Smear: NONE SEEN

## 2010-07-04 LAB — AFB CULTURE WITH SMEAR (NOT AT ARMC)

## 2010-07-17 LAB — GLUCOSE, CAPILLARY: Glucose-Capillary: 89 mg/dL (ref 70–99)

## 2010-10-30 ENCOUNTER — Ambulatory Visit (INDEPENDENT_AMBULATORY_CARE_PROVIDER_SITE_OTHER)
Admission: RE | Admit: 2010-10-30 | Discharge: 2010-10-30 | Disposition: A | Discharge status: Home / Self Care | Payer: Medicare Other | Source: Ambulatory Visit | Attending: Pulmonary Disease | Admitting: Pulmonary Disease

## 2010-10-30 DIAGNOSIS — J984 Other disorders of lung: Secondary | ICD-10-CM

## 2010-10-30 DIAGNOSIS — R911 Solitary pulmonary nodule: Secondary | ICD-10-CM

## 2010-11-23 ENCOUNTER — Ambulatory Visit (INDEPENDENT_AMBULATORY_CARE_PROVIDER_SITE_OTHER): Payer: Medicare Other | Admitting: Pulmonary Disease

## 2010-11-23 ENCOUNTER — Encounter: Payer: Self-pay | Admitting: Pulmonary Disease

## 2010-11-23 VITALS — BP 116/70 | HR 68 | Temp 98.3°F | Ht 67.0 in | Wt 177.4 lb

## 2010-11-23 DIAGNOSIS — R911 Solitary pulmonary nodule: Secondary | ICD-10-CM

## 2010-11-23 DIAGNOSIS — J984 Other disorders of lung: Secondary | ICD-10-CM

## 2010-11-23 NOTE — Patient Instructions (Signed)
CT scan in 6months.

## 2010-11-23 NOTE — Progress Notes (Signed)
  Subjective:    Patient ID: Tina Curry, female    DOB: 1940/11/29, 70 y.o.   MRN: [id number]  HPI Initial OV 07/13/09  69/F referred for evaluation of pulmonary nodule.  Review of old images - CXRs dated 7/03, 10/06, 11/07, 10/10 (gall bladder surgery) are essentially nml. She reports dyspnea x 1 year especially on climbing asteep hill. CXr on 2/21 /11 performed for this reason showed a RUL nodular infiltrate. Ct chest in 3/11 ([place]) showed 2.7 x 2.1 cm RUl spiculated ill defined nodular infiltrate . She reports occasional cough with white phlegm that she has attributed to allergies. She has been on nitrofurantoin since 2009 after bladder tack surgery. Spirometry showed FVC of 70%, no airway obstruction.  Pet scan 3/11 >> low SUV 3.0 uptake. Pulm arteries & heart appear enlarged. Reports neg cath in 2005  Exercise cardiac stress test neg for ischemia 7/11.  CT chest 8/11 - RUL mucoid impaction unchanged - new ground glass nodule 1.0 x 0.7 cm LUL,  nodular thyroid  FU chest CT jan'12 shows more prominent RUL nodule with surrounding smaller nodules ? infectious  Feb'12  bronchoscopy  >> cytolgy neg, cx neg, afb / fungal neg   11/23/2010 Chest pain - stress test neg, sleep apnea test -awaiting results Pt states she stillhas SOB on occasion but it is improved.  Ct chest 10/30/10 >> Persistent tubular structure in the right upper lobe, with gradual increase in prominence from prior exams although size stable 1.8 x 2.6 cm. LUL 6mm nodule stable. Stopped nitrofurantoin, put on trimethoprim but developed UTI, hence back on it now.    Review of Systems Patient denies significant dyspnea,cough, hemoptysis,  chest pain, palpitations, pedal edema, orthopnea, paroxysmal nocturnal dyspnea, lightheadedness, nausea, vomiting, abdominal or  leg pains      Objective:   Physical Exam Gen. Pleasant, well-nourished, in no distress ENT - no lesions, no post nasal drip Neck: No JVD, no thyromegaly, no carotid  bruits Lungs: no use of accessory muscles, no dullness to percussion, clear without rales or rhonchi  Cardiovascular: Rhythm regular, heart sounds  normal, no murmurs or gallops, no peripheral edema Musculoskeletal: No deformities, no cyanosis or clubbing         Assessment & Plan:

## 2010-11-24 NOTE — Assessment & Plan Note (Signed)
RUL 'tubular'nodule unchanged size since 3/11 but slight more prominent on last CT 7/12 LUL 6mm nodule stable Neg bscopy data 2/12 Doubt malignancy - will discuss at multidisciplinary conference whether navigational bscopy should be considered. Otherwise will need 2 yr FU to demonstrate stability She is back on nitrofurantoin now , will also follow on serial CTs for worsening scarring.

## 2011-05-03 ENCOUNTER — Ambulatory Visit (INDEPENDENT_AMBULATORY_CARE_PROVIDER_SITE_OTHER)
Admission: RE | Admit: 2011-05-03 | Discharge: 2011-05-03 | Disposition: A | Discharge status: Home / Self Care | Payer: Medicare Other | Source: Ambulatory Visit | Attending: Pulmonary Disease | Admitting: Pulmonary Disease

## 2011-05-03 DIAGNOSIS — R911 Solitary pulmonary nodule: Secondary | ICD-10-CM

## 2011-05-03 DIAGNOSIS — J984 Other disorders of lung: Secondary | ICD-10-CM | POA: Diagnosis not present

## 2011-05-04 ENCOUNTER — Telehealth: Payer: Self-pay | Admitting: Pulmonary Disease

## 2011-05-04 DIAGNOSIS — G4733 Obstructive sleep apnea (adult) (pediatric): Secondary | ICD-10-CM | POA: Diagnosis not present

## 2011-05-04 DIAGNOSIS — E782 Mixed hyperlipidemia: Secondary | ICD-10-CM | POA: Diagnosis not present

## 2011-05-04 NOTE — Telephone Encounter (Signed)
Notes Recorded by Comer Locket. Vassie Loll, MD on 05/03/2011 at 5:48 PM unchanged from 10/30/2010 - Continued surveillance is suggested- will discuss on FU  I spoke with patient about results and she verbalized understanding and had no questions

## 2011-05-07 ENCOUNTER — Ambulatory Visit (INDEPENDENT_AMBULATORY_CARE_PROVIDER_SITE_OTHER): Payer: Medicare Other | Admitting: Pulmonary Disease

## 2011-05-07 ENCOUNTER — Encounter: Payer: Self-pay | Admitting: Pulmonary Disease

## 2011-05-07 VITALS — BP 140/72 | HR 69 | Temp 97.9°F | Ht 67.0 in | Wt 179.6 lb

## 2011-05-07 DIAGNOSIS — J984 Other disorders of lung: Secondary | ICD-10-CM | POA: Diagnosis not present

## 2011-05-07 NOTE — Assessment & Plan Note (Signed)
RUL 'tubular'nodule unchanged size since 3/11 but slight more prominent on last CT 7/12 LUL 6mm nodule stable Neg bscopy data 2/12 Doubt malignancy - Otherwise will need 5 yr FU to demonstrate stability She is back on nitrofurantoin now , will also follow on serial CTs for worsening scarring.

## 2011-05-07 NOTE — Patient Instructions (Signed)
Ct scan in 6 months Unchanged nodule in right upper lung

## 2011-05-07 NOTE — Progress Notes (Signed)
  Subjective:    Patient ID: Tina Curry, female    DOB: 12-05-1940, 71 y.o.   MRN: [id number]  HPI PCP - Lorin Picket, Rosalita Levan  69/F for FU of RUL pulmonary nodule.  Initial OV 07/13/09  Review of old images - CXRs dated 7/03, 10/06, 11/07, 10/10 (gall bladder surgery) are essentially nml. She reports dyspnea x 1 year especially on climbing asteep hill. CXr on 2/21 /11 performed for this reason showed a RUL nodular infiltrate. Ct chest in 3/11 ([facility]) showed 2.7 x 2.1 cm RUl spiculated ill defined nodular infiltrate . She reports occasional cough with white phlegm that she has attributed to allergies. She has been on nitrofurantoin since 2009 after bladder tack surgery. Spirometry showed FVC of 70%, no airway obstruction.  Pet scan 3/11 >> low SUV 3.0 uptake. Pulm arteries & heart appear enlarged. Reports neg cath in 2005  Exercise cardiac stress test neg for ischemia 7/11.  CT chest 8/11 - RUL mucoid impaction unchanged - new ground glass nodule 1.0 x 0.7 cm LUL,  nodular thyroid  FU chest CT jan'12 shows more prominent RUL nodule with surrounding smaller nodules ? infectious  Feb'12 bronchoscopy >> cytolgy neg, cx neg, afb / fungal neg  Ct chest 10/30/10 >> Persistent tubular structure in the right upper lobe, with gradual increase in prominence from prior exams although size stable 1.8 x 2.6 cm. LUL 6mm nodule stable.    05/07/2011 Sleep study at [facility] (Dr Lorin Picket)  Was followed by home study & CPAP set up  RUL 'tubular'nodule unchanged size since 3/11 but slight more prominent on CT 7/12 LUL 6mm nodule stable Neg bscopy data 2/12 She is back on nitrofurantoin now  Unable to walk much due to bad knee, dyspnea stable- no wheeze      Review of Systems Patient denies significant dyspnea,cough, hemoptysis,  chest pain, palpitations, pedal edema, orthopnea, paroxysmal nocturnal dyspnea, lightheadedness, nausea, vomiting, abdominal or  leg pains      Objective:   Physical Exam Gen.  Pleasant, well-nourished, in no distress ENT - no lesions, no post nasal drip Neck: No JVD, no thyromegaly, no carotid bruits Lungs: no use of accessory muscles, no dullness to percussion, clear without rales or rhonchi  Cardiovascular: Rhythm regular, heart sounds  normal, no murmurs or gallops, no peripheral edema Musculoskeletal: No deformities, no cyanosis or clubbing         Assessment & Plan:

## 2011-05-08 DIAGNOSIS — N390 Urinary tract infection, site not specified: Secondary | ICD-10-CM | POA: Diagnosis not present

## 2011-06-06 DIAGNOSIS — N390 Urinary tract infection, site not specified: Secondary | ICD-10-CM | POA: Diagnosis not present

## 2011-06-20 DIAGNOSIS — H353190 Nonexudative age-related macular degeneration, unspecified eye, stage unspecified: Secondary | ICD-10-CM | POA: Diagnosis not present

## 2011-07-09 DIAGNOSIS — N390 Urinary tract infection, site not specified: Secondary | ICD-10-CM | POA: Diagnosis not present

## 2011-07-09 DIAGNOSIS — R339 Retention of urine, unspecified: Secondary | ICD-10-CM | POA: Diagnosis not present

## 2011-07-09 DIAGNOSIS — N952 Postmenopausal atrophic vaginitis: Secondary | ICD-10-CM | POA: Diagnosis not present

## 2011-07-11 DIAGNOSIS — I10 Essential (primary) hypertension: Secondary | ICD-10-CM | POA: Diagnosis not present

## 2011-07-11 DIAGNOSIS — J309 Allergic rhinitis, unspecified: Secondary | ICD-10-CM | POA: Diagnosis not present

## 2011-07-11 DIAGNOSIS — R42 Dizziness and giddiness: Secondary | ICD-10-CM | POA: Diagnosis not present

## 2011-07-11 DIAGNOSIS — R002 Palpitations: Secondary | ICD-10-CM | POA: Diagnosis not present

## 2011-07-16 DIAGNOSIS — R5383 Other fatigue: Secondary | ICD-10-CM | POA: Diagnosis not present

## 2011-07-16 DIAGNOSIS — M542 Cervicalgia: Secondary | ICD-10-CM | POA: Diagnosis not present

## 2011-07-16 DIAGNOSIS — R5381 Other malaise: Secondary | ICD-10-CM | POA: Diagnosis not present

## 2011-07-18 DIAGNOSIS — M542 Cervicalgia: Secondary | ICD-10-CM | POA: Diagnosis not present

## 2011-07-19 DIAGNOSIS — M542 Cervicalgia: Secondary | ICD-10-CM | POA: Diagnosis not present

## 2011-07-23 DIAGNOSIS — M542 Cervicalgia: Secondary | ICD-10-CM | POA: Diagnosis not present

## 2011-07-25 DIAGNOSIS — J209 Acute bronchitis, unspecified: Secondary | ICD-10-CM | POA: Diagnosis not present

## 2011-07-25 DIAGNOSIS — M542 Cervicalgia: Secondary | ICD-10-CM | POA: Diagnosis not present

## 2011-07-27 DIAGNOSIS — M542 Cervicalgia: Secondary | ICD-10-CM | POA: Diagnosis not present

## 2011-07-27 DIAGNOSIS — R042 Hemoptysis: Secondary | ICD-10-CM | POA: Diagnosis not present

## 2011-07-31 DIAGNOSIS — J189 Pneumonia, unspecified organism: Secondary | ICD-10-CM | POA: Diagnosis not present

## 2011-07-31 DIAGNOSIS — F411 Generalized anxiety disorder: Secondary | ICD-10-CM | POA: Diagnosis not present

## 2011-08-06 DIAGNOSIS — I10 Essential (primary) hypertension: Secondary | ICD-10-CM | POA: Diagnosis not present

## 2011-08-06 DIAGNOSIS — M542 Cervicalgia: Secondary | ICD-10-CM | POA: Diagnosis not present

## 2011-08-06 DIAGNOSIS — E785 Hyperlipidemia, unspecified: Secondary | ICD-10-CM | POA: Diagnosis not present

## 2011-08-06 DIAGNOSIS — Z79899 Other long term (current) drug therapy: Secondary | ICD-10-CM | POA: Diagnosis not present

## 2011-08-07 DIAGNOSIS — B370 Candidal stomatitis: Secondary | ICD-10-CM | POA: Diagnosis not present

## 2011-08-07 DIAGNOSIS — J189 Pneumonia, unspecified organism: Secondary | ICD-10-CM | POA: Diagnosis not present

## 2011-08-08 DIAGNOSIS — H8190 Unspecified disorder of vestibular function, unspecified ear: Secondary | ICD-10-CM | POA: Diagnosis not present

## 2011-08-08 DIAGNOSIS — H905 Unspecified sensorineural hearing loss: Secondary | ICD-10-CM | POA: Diagnosis not present

## 2011-08-08 DIAGNOSIS — M542 Cervicalgia: Secondary | ICD-10-CM | POA: Diagnosis not present

## 2011-08-10 DIAGNOSIS — M542 Cervicalgia: Secondary | ICD-10-CM | POA: Diagnosis not present

## 2011-08-13 DIAGNOSIS — M542 Cervicalgia: Secondary | ICD-10-CM | POA: Diagnosis not present

## 2011-08-15 DIAGNOSIS — M542 Cervicalgia: Secondary | ICD-10-CM | POA: Diagnosis not present

## 2011-08-17 DIAGNOSIS — M542 Cervicalgia: Secondary | ICD-10-CM | POA: Diagnosis not present

## 2011-08-21 DIAGNOSIS — J189 Pneumonia, unspecified organism: Secondary | ICD-10-CM | POA: Diagnosis not present

## 2011-08-21 DIAGNOSIS — I10 Essential (primary) hypertension: Secondary | ICD-10-CM | POA: Diagnosis not present

## 2011-08-22 DIAGNOSIS — J189 Pneumonia, unspecified organism: Secondary | ICD-10-CM | POA: Diagnosis not present

## 2011-08-28 DIAGNOSIS — M25569 Pain in unspecified knee: Secondary | ICD-10-CM | POA: Diagnosis not present

## 2011-10-09 DIAGNOSIS — N390 Urinary tract infection, site not specified: Secondary | ICD-10-CM | POA: Diagnosis not present

## 2011-10-09 DIAGNOSIS — N952 Postmenopausal atrophic vaginitis: Secondary | ICD-10-CM | POA: Diagnosis not present

## 2011-10-09 DIAGNOSIS — R339 Retention of urine, unspecified: Secondary | ICD-10-CM | POA: Diagnosis not present

## 2011-10-09 DIAGNOSIS — R300 Dysuria: Secondary | ICD-10-CM | POA: Diagnosis not present

## 2011-11-01 ENCOUNTER — Ambulatory Visit (INDEPENDENT_AMBULATORY_CARE_PROVIDER_SITE_OTHER)
Admission: RE | Admit: 2011-11-01 | Discharge: 2011-11-01 | Disposition: A | Discharge status: Home / Self Care | Payer: Medicare Other | Source: Ambulatory Visit | Attending: Pulmonary Disease | Admitting: Pulmonary Disease

## 2011-11-01 DIAGNOSIS — J984 Other disorders of lung: Secondary | ICD-10-CM | POA: Diagnosis not present

## 2011-11-01 DIAGNOSIS — R918 Other nonspecific abnormal finding of lung field: Secondary | ICD-10-CM | POA: Diagnosis not present

## 2011-11-08 ENCOUNTER — Encounter: Payer: Self-pay | Admitting: Pulmonary Disease

## 2011-11-08 ENCOUNTER — Ambulatory Visit (INDEPENDENT_AMBULATORY_CARE_PROVIDER_SITE_OTHER): Payer: Medicare Other | Admitting: Pulmonary Disease

## 2011-11-08 VITALS — BP 140/70 | HR 74 | Temp 97.9°F | Ht 65.0 in | Wt 176.0 lb

## 2011-11-08 DIAGNOSIS — G4733 Obstructive sleep apnea (adult) (pediatric): Secondary | ICD-10-CM

## 2011-11-08 DIAGNOSIS — J984 Other disorders of lung: Secondary | ICD-10-CM | POA: Diagnosis not present

## 2011-11-08 HISTORY — DX: Obstructive sleep apnea (adult) (pediatric): G47.33

## 2011-11-08 NOTE — Assessment & Plan Note (Signed)
CT 7/13 Stable pulmonary parenchymal nodule within the right upper lobe. This is not changed in size since 12/09/2009 and is most  likely benign.  Stable small nodule in the right lower lobe Neg FOB 2/12 Will follow in 1 yr

## 2011-11-08 NOTE — Progress Notes (Signed)
  Subjective:    Patient ID: Tina Curry, female    DOB: 03/29/1941, 71 y.o.   MRN: [id number]  HPI PCP - Lorin Picket, Rosalita Levan  69/F for FU of RUL pulmonary nodule.  Initial OV 07/13/09  Review of old images - CXRs dated 7/03, 10/06, 11/07, 10/10 (gall bladder surgery) are essentially nml. She reports dyspnea x 1 year especially on climbing a steep hill. CXr on 2/21 /11 performed for this reason showed a RUL nodular infiltrate. Ct chest in 3/11 ([facility]) showed 2.7 x 2.1 cm RUl spiculated ill defined nodular infiltrate . She reports occasional cough with white phlegm that she has attributed to allergies. She has been on nitrofurantoin since 2009 after bladder tack surgery. Spirometry showed FVC of 70%, no airway obstruction.  Pet scan 3/11 >> low SUV 3.0 uptake. Pulm arteries & heart appear enlarged. Reports neg cath in 2005  Exercise cardiac stress test neg for ischemia 7/11.   FU chest CT jan'12 shows more prominent RUL nodule with surrounding smaller nodules ? infectious  Feb'12 bronchoscopy >> cytolgy neg, cx neg, afb / fungal neg     05/07/2011  back on nitrofurantoin     11/08/2011 dyspnea stable- no wheeze  CT 7/13 Stable pulmonary parenchymal nodule within the right upper  lobe. This is not changed in size since 12/09/2009 and is most  likely benign.  Stable small nodule in the right lower lobe Sleep study at [facility] (Dr Lorin Picket) Was followed by home study & CPAP set up  ESS 8/24 PSG 10/12 showed mild OSa with [facility] 9/h, predominant hypopneas, she does not tolerate CPAP well & requests my opinion about this.    Review of Systems Patient denies significant dyspnea,cough, hemoptysis,  chest pain, palpitations, pedal edema, orthopnea, paroxysmal nocturnal dyspnea, lightheadedness, nausea, vomiting, abdominal or  leg pains      Objective:   Physical Exam  Gen. Pleasant, well-nourished, in no distress ENT - no lesions, no post nasal drip Neck: No JVD, no thyromegaly, no carotid  bruits Lungs: no use of accessory muscles, no dullness to percussion, clear without rales or rhonchi  Cardiovascular: Rhythm regular, heart sounds  normal, no murmurs or gallops, no peripheral edema Musculoskeletal: No deformities, no cyanosis or clubbing        Assessment & Plan:

## 2011-11-08 NOTE — Patient Instructions (Signed)
Nodules are stable on CT scan for 2 yrs , Rpt scan in 1 yr Sleep studies show only mild obstructive sleep apnea  - Ok to stop using cpap Some weight loss would help

## 2011-11-08 NOTE — Assessment & Plan Note (Signed)
PSG 10/12 mild, 9/h She is not 'sleepy' & does not have significant cardiovascular co-morbidity. CPAP compliance with goal of at least 4-6 hrs every night is the expectation.I have told her that if she really cannot tolerate CPAP , she can discontinue this. I briefly discussed oral appliance with her - she is not interested. Weight loss encouraged Advised against medications with sedative side effects Cautioned against driving when sleepy - understanding that sleepiness will vary on a day to day basis

## 2011-11-13 DIAGNOSIS — M25569 Pain in unspecified knee: Secondary | ICD-10-CM | POA: Diagnosis not present

## 2011-11-29 DIAGNOSIS — M79609 Pain in unspecified limb: Secondary | ICD-10-CM | POA: Diagnosis not present

## 2011-12-19 DIAGNOSIS — H2589 Other age-related cataract: Secondary | ICD-10-CM | POA: Diagnosis not present

## 2012-01-04 DIAGNOSIS — M1710 Unilateral primary osteoarthritis, unspecified knee: Secondary | ICD-10-CM | POA: Diagnosis not present

## 2012-01-23 DIAGNOSIS — N390 Urinary tract infection, site not specified: Secondary | ICD-10-CM | POA: Diagnosis not present

## 2012-01-23 DIAGNOSIS — R339 Retention of urine, unspecified: Secondary | ICD-10-CM | POA: Diagnosis not present

## 2012-01-23 DIAGNOSIS — N952 Postmenopausal atrophic vaginitis: Secondary | ICD-10-CM | POA: Diagnosis not present

## 2012-01-29 DIAGNOSIS — E7800 Pure hypercholesterolemia, unspecified: Secondary | ICD-10-CM | POA: Diagnosis not present

## 2012-01-29 DIAGNOSIS — Z23 Encounter for immunization: Secondary | ICD-10-CM | POA: Diagnosis not present

## 2012-01-29 DIAGNOSIS — I10 Essential (primary) hypertension: Secondary | ICD-10-CM | POA: Diagnosis not present

## 2012-01-29 DIAGNOSIS — Z79899 Other long term (current) drug therapy: Secondary | ICD-10-CM | POA: Diagnosis not present

## 2012-02-06 DIAGNOSIS — K649 Unspecified hemorrhoids: Secondary | ICD-10-CM | POA: Diagnosis not present

## 2012-02-11 DIAGNOSIS — E785 Hyperlipidemia, unspecified: Secondary | ICD-10-CM | POA: Diagnosis not present

## 2012-02-11 DIAGNOSIS — I10 Essential (primary) hypertension: Secondary | ICD-10-CM | POA: Diagnosis not present

## 2012-02-13 DIAGNOSIS — M25569 Pain in unspecified knee: Secondary | ICD-10-CM | POA: Diagnosis not present

## 2012-02-13 DIAGNOSIS — M25469 Effusion, unspecified knee: Secondary | ICD-10-CM | POA: Diagnosis not present

## 2012-02-13 DIAGNOSIS — M1710 Unilateral primary osteoarthritis, unspecified knee: Secondary | ICD-10-CM | POA: Diagnosis not present

## 2012-02-22 DIAGNOSIS — Z124 Encounter for screening for malignant neoplasm of cervix: Secondary | ICD-10-CM | POA: Diagnosis not present

## 2012-02-22 DIAGNOSIS — Z01419 Encounter for gynecological examination (general) (routine) without abnormal findings: Secondary | ICD-10-CM | POA: Diagnosis not present

## 2012-02-26 DIAGNOSIS — M1710 Unilateral primary osteoarthritis, unspecified knee: Secondary | ICD-10-CM | POA: Diagnosis not present

## 2012-03-04 DIAGNOSIS — Z1231 Encounter for screening mammogram for malignant neoplasm of breast: Secondary | ICD-10-CM | POA: Diagnosis not present

## 2012-03-04 DIAGNOSIS — M1710 Unilateral primary osteoarthritis, unspecified knee: Secondary | ICD-10-CM | POA: Diagnosis not present

## 2012-03-06 DIAGNOSIS — N390 Urinary tract infection, site not specified: Secondary | ICD-10-CM | POA: Diagnosis not present

## 2012-03-08 DIAGNOSIS — R1110 Vomiting, unspecified: Secondary | ICD-10-CM | POA: Diagnosis not present

## 2012-03-11 DIAGNOSIS — M1710 Unilateral primary osteoarthritis, unspecified knee: Secondary | ICD-10-CM | POA: Diagnosis not present

## 2012-03-13 DIAGNOSIS — J209 Acute bronchitis, unspecified: Secondary | ICD-10-CM | POA: Diagnosis not present

## 2012-03-13 DIAGNOSIS — M1710 Unilateral primary osteoarthritis, unspecified knee: Secondary | ICD-10-CM | POA: Diagnosis not present

## 2012-03-19 DIAGNOSIS — M1710 Unilateral primary osteoarthritis, unspecified knee: Secondary | ICD-10-CM | POA: Diagnosis not present

## 2012-03-26 DIAGNOSIS — M1710 Unilateral primary osteoarthritis, unspecified knee: Secondary | ICD-10-CM | POA: Diagnosis not present

## 2012-03-27 DIAGNOSIS — N390 Urinary tract infection, site not specified: Secondary | ICD-10-CM | POA: Diagnosis not present

## 2012-04-27 DIAGNOSIS — J111 Influenza due to unidentified influenza virus with other respiratory manifestations: Secondary | ICD-10-CM | POA: Diagnosis not present

## 2012-05-14 DIAGNOSIS — N3289 Other specified disorders of bladder: Secondary | ICD-10-CM | POA: Diagnosis not present

## 2012-05-14 DIAGNOSIS — N952 Postmenopausal atrophic vaginitis: Secondary | ICD-10-CM | POA: Diagnosis not present

## 2012-05-14 DIAGNOSIS — N3090 Cystitis, unspecified without hematuria: Secondary | ICD-10-CM | POA: Diagnosis not present

## 2012-05-23 DIAGNOSIS — Z01818 Encounter for other preprocedural examination: Secondary | ICD-10-CM | POA: Diagnosis not present

## 2012-05-28 DIAGNOSIS — R079 Chest pain, unspecified: Secondary | ICD-10-CM | POA: Diagnosis not present

## 2012-05-28 DIAGNOSIS — Z01810 Encounter for preprocedural cardiovascular examination: Secondary | ICD-10-CM | POA: Diagnosis not present

## 2012-05-28 DIAGNOSIS — E785 Hyperlipidemia, unspecified: Secondary | ICD-10-CM | POA: Diagnosis not present

## 2012-05-28 DIAGNOSIS — I10 Essential (primary) hypertension: Secondary | ICD-10-CM | POA: Diagnosis not present

## 2012-06-03 DIAGNOSIS — I10 Essential (primary) hypertension: Secondary | ICD-10-CM | POA: Diagnosis not present

## 2012-06-03 DIAGNOSIS — K648 Other hemorrhoids: Secondary | ICD-10-CM | POA: Diagnosis not present

## 2012-06-04 DIAGNOSIS — K649 Unspecified hemorrhoids: Secondary | ICD-10-CM | POA: Diagnosis not present

## 2012-06-04 DIAGNOSIS — Z79899 Other long term (current) drug therapy: Secondary | ICD-10-CM | POA: Diagnosis not present

## 2012-06-04 DIAGNOSIS — K644 Residual hemorrhoidal skin tags: Secondary | ICD-10-CM | POA: Diagnosis not present

## 2012-06-04 DIAGNOSIS — K648 Other hemorrhoids: Secondary | ICD-10-CM | POA: Diagnosis not present

## 2012-06-04 DIAGNOSIS — I10 Essential (primary) hypertension: Secondary | ICD-10-CM | POA: Diagnosis not present

## 2012-06-09 DIAGNOSIS — R509 Fever, unspecified: Secondary | ICD-10-CM | POA: Diagnosis not present

## 2012-06-24 DIAGNOSIS — R9431 Abnormal electrocardiogram [ECG] [EKG]: Secondary | ICD-10-CM | POA: Diagnosis not present

## 2012-06-24 DIAGNOSIS — Z01812 Encounter for preprocedural laboratory examination: Secondary | ICD-10-CM | POA: Diagnosis not present

## 2012-06-24 DIAGNOSIS — Z7901 Long term (current) use of anticoagulants: Secondary | ICD-10-CM | POA: Diagnosis not present

## 2012-06-24 DIAGNOSIS — Z01810 Encounter for preprocedural cardiovascular examination: Secondary | ICD-10-CM | POA: Diagnosis not present

## 2012-06-24 DIAGNOSIS — Z79899 Other long term (current) drug therapy: Secondary | ICD-10-CM | POA: Diagnosis not present

## 2012-06-24 DIAGNOSIS — Z01818 Encounter for other preprocedural examination: Secondary | ICD-10-CM | POA: Diagnosis not present

## 2012-06-24 DIAGNOSIS — R5381 Other malaise: Secondary | ICD-10-CM | POA: Diagnosis not present

## 2012-07-04 DIAGNOSIS — R1032 Left lower quadrant pain: Secondary | ICD-10-CM | POA: Diagnosis not present

## 2012-07-07 DIAGNOSIS — N390 Urinary tract infection, site not specified: Secondary | ICD-10-CM | POA: Diagnosis not present

## 2012-07-07 DIAGNOSIS — N952 Postmenopausal atrophic vaginitis: Secondary | ICD-10-CM | POA: Diagnosis not present

## 2012-07-09 DIAGNOSIS — Z95818 Presence of other cardiac implants and grafts: Secondary | ICD-10-CM | POA: Diagnosis not present

## 2012-07-09 DIAGNOSIS — F329 Major depressive disorder, single episode, unspecified: Secondary | ICD-10-CM | POA: Diagnosis not present

## 2012-07-09 DIAGNOSIS — I10 Essential (primary) hypertension: Secondary | ICD-10-CM | POA: Diagnosis present

## 2012-07-09 DIAGNOSIS — M25569 Pain in unspecified knee: Secondary | ICD-10-CM | POA: Diagnosis not present

## 2012-07-09 DIAGNOSIS — K219 Gastro-esophageal reflux disease without esophagitis: Secondary | ICD-10-CM | POA: Diagnosis not present

## 2012-07-09 DIAGNOSIS — F411 Generalized anxiety disorder: Secondary | ICD-10-CM | POA: Diagnosis not present

## 2012-07-09 DIAGNOSIS — M21869 Other specified acquired deformities of unspecified lower leg: Secondary | ICD-10-CM | POA: Diagnosis not present

## 2012-07-09 DIAGNOSIS — Z7982 Long term (current) use of aspirin: Secondary | ICD-10-CM | POA: Diagnosis not present

## 2012-07-09 DIAGNOSIS — D500 Iron deficiency anemia secondary to blood loss (chronic): Secondary | ICD-10-CM | POA: Diagnosis not present

## 2012-07-09 DIAGNOSIS — D638 Anemia in other chronic diseases classified elsewhere: Secondary | ICD-10-CM | POA: Diagnosis not present

## 2012-07-09 DIAGNOSIS — Z23 Encounter for immunization: Secondary | ICD-10-CM | POA: Diagnosis not present

## 2012-07-09 DIAGNOSIS — M1710 Unilateral primary osteoarthritis, unspecified knee: Secondary | ICD-10-CM | POA: Diagnosis not present

## 2012-07-09 DIAGNOSIS — Z79899 Other long term (current) drug therapy: Secondary | ICD-10-CM | POA: Diagnosis not present

## 2012-07-09 DIAGNOSIS — E785 Hyperlipidemia, unspecified: Secondary | ICD-10-CM | POA: Diagnosis not present

## 2012-07-13 DIAGNOSIS — I10 Essential (primary) hypertension: Secondary | ICD-10-CM | POA: Diagnosis not present

## 2012-07-13 DIAGNOSIS — R269 Unspecified abnormalities of gait and mobility: Secondary | ICD-10-CM | POA: Diagnosis not present

## 2012-07-13 DIAGNOSIS — Z471 Aftercare following joint replacement surgery: Secondary | ICD-10-CM | POA: Diagnosis not present

## 2012-07-13 DIAGNOSIS — E785 Hyperlipidemia, unspecified: Secondary | ICD-10-CM | POA: Diagnosis not present

## 2012-07-14 DIAGNOSIS — Z471 Aftercare following joint replacement surgery: Secondary | ICD-10-CM | POA: Diagnosis not present

## 2012-07-14 DIAGNOSIS — E785 Hyperlipidemia, unspecified: Secondary | ICD-10-CM | POA: Diagnosis not present

## 2012-07-14 DIAGNOSIS — I10 Essential (primary) hypertension: Secondary | ICD-10-CM | POA: Diagnosis not present

## 2012-07-14 DIAGNOSIS — R269 Unspecified abnormalities of gait and mobility: Secondary | ICD-10-CM | POA: Diagnosis not present

## 2012-07-15 DIAGNOSIS — Z471 Aftercare following joint replacement surgery: Secondary | ICD-10-CM | POA: Diagnosis not present

## 2012-07-15 DIAGNOSIS — E785 Hyperlipidemia, unspecified: Secondary | ICD-10-CM | POA: Diagnosis not present

## 2012-07-15 DIAGNOSIS — R269 Unspecified abnormalities of gait and mobility: Secondary | ICD-10-CM | POA: Diagnosis not present

## 2012-07-15 DIAGNOSIS — I10 Essential (primary) hypertension: Secondary | ICD-10-CM | POA: Diagnosis not present

## 2012-07-16 DIAGNOSIS — E785 Hyperlipidemia, unspecified: Secondary | ICD-10-CM | POA: Diagnosis not present

## 2012-07-16 DIAGNOSIS — I10 Essential (primary) hypertension: Secondary | ICD-10-CM | POA: Diagnosis not present

## 2012-07-16 DIAGNOSIS — Z471 Aftercare following joint replacement surgery: Secondary | ICD-10-CM | POA: Diagnosis not present

## 2012-07-16 DIAGNOSIS — R269 Unspecified abnormalities of gait and mobility: Secondary | ICD-10-CM | POA: Diagnosis not present

## 2012-07-17 DIAGNOSIS — R269 Unspecified abnormalities of gait and mobility: Secondary | ICD-10-CM | POA: Diagnosis not present

## 2012-07-17 DIAGNOSIS — E785 Hyperlipidemia, unspecified: Secondary | ICD-10-CM | POA: Diagnosis not present

## 2012-07-17 DIAGNOSIS — Z471 Aftercare following joint replacement surgery: Secondary | ICD-10-CM | POA: Diagnosis not present

## 2012-07-17 DIAGNOSIS — I10 Essential (primary) hypertension: Secondary | ICD-10-CM | POA: Diagnosis not present

## 2012-07-18 DIAGNOSIS — I10 Essential (primary) hypertension: Secondary | ICD-10-CM | POA: Diagnosis not present

## 2012-07-18 DIAGNOSIS — E785 Hyperlipidemia, unspecified: Secondary | ICD-10-CM | POA: Diagnosis not present

## 2012-07-18 DIAGNOSIS — R269 Unspecified abnormalities of gait and mobility: Secondary | ICD-10-CM | POA: Diagnosis not present

## 2012-07-18 DIAGNOSIS — Z471 Aftercare following joint replacement surgery: Secondary | ICD-10-CM | POA: Diagnosis not present

## 2012-07-21 DIAGNOSIS — I10 Essential (primary) hypertension: Secondary | ICD-10-CM | POA: Diagnosis not present

## 2012-07-21 DIAGNOSIS — Z471 Aftercare following joint replacement surgery: Secondary | ICD-10-CM | POA: Diagnosis not present

## 2012-07-21 DIAGNOSIS — R269 Unspecified abnormalities of gait and mobility: Secondary | ICD-10-CM | POA: Diagnosis not present

## 2012-07-21 DIAGNOSIS — E785 Hyperlipidemia, unspecified: Secondary | ICD-10-CM | POA: Diagnosis not present

## 2012-07-22 DIAGNOSIS — M1710 Unilateral primary osteoarthritis, unspecified knee: Secondary | ICD-10-CM | POA: Diagnosis not present

## 2012-07-22 DIAGNOSIS — M25569 Pain in unspecified knee: Secondary | ICD-10-CM | POA: Diagnosis not present

## 2012-07-23 DIAGNOSIS — M1710 Unilateral primary osteoarthritis, unspecified knee: Secondary | ICD-10-CM | POA: Diagnosis not present

## 2012-07-23 DIAGNOSIS — M25569 Pain in unspecified knee: Secondary | ICD-10-CM | POA: Diagnosis not present

## 2012-07-25 DIAGNOSIS — M1710 Unilateral primary osteoarthritis, unspecified knee: Secondary | ICD-10-CM | POA: Diagnosis not present

## 2012-07-25 DIAGNOSIS — M25569 Pain in unspecified knee: Secondary | ICD-10-CM | POA: Diagnosis not present

## 2012-07-28 DIAGNOSIS — M1710 Unilateral primary osteoarthritis, unspecified knee: Secondary | ICD-10-CM | POA: Diagnosis not present

## 2012-07-28 DIAGNOSIS — M25569 Pain in unspecified knee: Secondary | ICD-10-CM | POA: Diagnosis not present

## 2012-07-30 DIAGNOSIS — M1710 Unilateral primary osteoarthritis, unspecified knee: Secondary | ICD-10-CM | POA: Diagnosis not present

## 2012-08-01 DIAGNOSIS — M25569 Pain in unspecified knee: Secondary | ICD-10-CM | POA: Diagnosis not present

## 2012-08-01 DIAGNOSIS — M1710 Unilateral primary osteoarthritis, unspecified knee: Secondary | ICD-10-CM | POA: Diagnosis not present

## 2012-08-04 DIAGNOSIS — R110 Nausea: Secondary | ICD-10-CM | POA: Diagnosis not present

## 2012-08-04 DIAGNOSIS — R634 Abnormal weight loss: Secondary | ICD-10-CM | POA: Diagnosis not present

## 2012-08-04 DIAGNOSIS — M1710 Unilateral primary osteoarthritis, unspecified knee: Secondary | ICD-10-CM | POA: Diagnosis not present

## 2012-08-04 DIAGNOSIS — M25569 Pain in unspecified knee: Secondary | ICD-10-CM | POA: Diagnosis not present

## 2012-08-06 DIAGNOSIS — M25569 Pain in unspecified knee: Secondary | ICD-10-CM | POA: Diagnosis not present

## 2012-08-06 DIAGNOSIS — M1710 Unilateral primary osteoarthritis, unspecified knee: Secondary | ICD-10-CM | POA: Diagnosis not present

## 2012-08-12 DIAGNOSIS — D1330 Benign neoplasm of unspecified part of small intestine: Secondary | ICD-10-CM | POA: Diagnosis not present

## 2012-08-12 DIAGNOSIS — K449 Diaphragmatic hernia without obstruction or gangrene: Secondary | ICD-10-CM | POA: Diagnosis not present

## 2012-08-12 DIAGNOSIS — Z7982 Long term (current) use of aspirin: Secondary | ICD-10-CM | POA: Diagnosis not present

## 2012-08-12 DIAGNOSIS — M1710 Unilateral primary osteoarthritis, unspecified knee: Secondary | ICD-10-CM | POA: Diagnosis not present

## 2012-08-12 DIAGNOSIS — Z8719 Personal history of other diseases of the digestive system: Secondary | ICD-10-CM | POA: Diagnosis not present

## 2012-08-12 DIAGNOSIS — Z79899 Other long term (current) drug therapy: Secondary | ICD-10-CM | POA: Diagnosis not present

## 2012-08-12 DIAGNOSIS — Z713 Dietary counseling and surveillance: Secondary | ICD-10-CM | POA: Diagnosis not present

## 2012-08-12 DIAGNOSIS — E7800 Pure hypercholesterolemia, unspecified: Secondary | ICD-10-CM | POA: Diagnosis not present

## 2012-08-12 DIAGNOSIS — M25569 Pain in unspecified knee: Secondary | ICD-10-CM | POA: Diagnosis not present

## 2012-08-12 DIAGNOSIS — R110 Nausea: Secondary | ICD-10-CM | POA: Diagnosis not present

## 2012-08-12 DIAGNOSIS — K219 Gastro-esophageal reflux disease without esophagitis: Secondary | ICD-10-CM | POA: Diagnosis not present

## 2012-08-12 DIAGNOSIS — R630 Anorexia: Secondary | ICD-10-CM | POA: Diagnosis not present

## 2012-08-12 DIAGNOSIS — I10 Essential (primary) hypertension: Secondary | ICD-10-CM | POA: Diagnosis not present

## 2012-08-12 DIAGNOSIS — R1310 Dysphagia, unspecified: Secondary | ICD-10-CM | POA: Diagnosis not present

## 2012-08-12 DIAGNOSIS — D131 Benign neoplasm of stomach: Secondary | ICD-10-CM | POA: Diagnosis not present

## 2012-08-15 DIAGNOSIS — M1710 Unilateral primary osteoarthritis, unspecified knee: Secondary | ICD-10-CM | POA: Diagnosis not present

## 2012-08-15 DIAGNOSIS — M25569 Pain in unspecified knee: Secondary | ICD-10-CM | POA: Diagnosis not present

## 2012-08-18 DIAGNOSIS — M1710 Unilateral primary osteoarthritis, unspecified knee: Secondary | ICD-10-CM | POA: Diagnosis not present

## 2012-08-18 DIAGNOSIS — M25569 Pain in unspecified knee: Secondary | ICD-10-CM | POA: Diagnosis not present

## 2012-08-20 DIAGNOSIS — N952 Postmenopausal atrophic vaginitis: Secondary | ICD-10-CM | POA: Diagnosis not present

## 2012-08-20 DIAGNOSIS — N390 Urinary tract infection, site not specified: Secondary | ICD-10-CM | POA: Diagnosis not present

## 2012-08-21 DIAGNOSIS — M1710 Unilateral primary osteoarthritis, unspecified knee: Secondary | ICD-10-CM | POA: Diagnosis not present

## 2012-08-21 DIAGNOSIS — M25569 Pain in unspecified knee: Secondary | ICD-10-CM | POA: Diagnosis not present

## 2012-08-22 DIAGNOSIS — R110 Nausea: Secondary | ICD-10-CM | POA: Diagnosis not present

## 2012-08-22 DIAGNOSIS — R634 Abnormal weight loss: Secondary | ICD-10-CM | POA: Diagnosis not present

## 2012-08-22 DIAGNOSIS — K4090 Unilateral inguinal hernia, without obstruction or gangrene, not specified as recurrent: Secondary | ICD-10-CM | POA: Diagnosis not present

## 2012-08-22 DIAGNOSIS — R109 Unspecified abdominal pain: Secondary | ICD-10-CM | POA: Diagnosis not present

## 2012-08-22 DIAGNOSIS — R630 Anorexia: Secondary | ICD-10-CM | POA: Diagnosis not present

## 2012-08-25 DIAGNOSIS — M1710 Unilateral primary osteoarthritis, unspecified knee: Secondary | ICD-10-CM | POA: Diagnosis not present

## 2012-08-25 DIAGNOSIS — M25569 Pain in unspecified knee: Secondary | ICD-10-CM | POA: Diagnosis not present

## 2012-08-28 DIAGNOSIS — M25569 Pain in unspecified knee: Secondary | ICD-10-CM | POA: Diagnosis not present

## 2012-08-28 DIAGNOSIS — M1710 Unilateral primary osteoarthritis, unspecified knee: Secondary | ICD-10-CM | POA: Diagnosis not present

## 2012-09-02 DIAGNOSIS — M25569 Pain in unspecified knee: Secondary | ICD-10-CM | POA: Diagnosis not present

## 2012-09-02 DIAGNOSIS — M1710 Unilateral primary osteoarthritis, unspecified knee: Secondary | ICD-10-CM | POA: Diagnosis not present

## 2012-09-03 DIAGNOSIS — R1310 Dysphagia, unspecified: Secondary | ICD-10-CM | POA: Diagnosis not present

## 2012-09-05 DIAGNOSIS — M25569 Pain in unspecified knee: Secondary | ICD-10-CM | POA: Diagnosis not present

## 2012-09-05 DIAGNOSIS — M1710 Unilateral primary osteoarthritis, unspecified knee: Secondary | ICD-10-CM | POA: Diagnosis not present

## 2012-09-09 DIAGNOSIS — M1710 Unilateral primary osteoarthritis, unspecified knee: Secondary | ICD-10-CM | POA: Diagnosis not present

## 2012-09-09 DIAGNOSIS — M25569 Pain in unspecified knee: Secondary | ICD-10-CM | POA: Diagnosis not present

## 2012-09-12 DIAGNOSIS — M25569 Pain in unspecified knee: Secondary | ICD-10-CM | POA: Diagnosis not present

## 2012-09-12 DIAGNOSIS — M1710 Unilateral primary osteoarthritis, unspecified knee: Secondary | ICD-10-CM | POA: Diagnosis not present

## 2012-09-16 DIAGNOSIS — M1710 Unilateral primary osteoarthritis, unspecified knee: Secondary | ICD-10-CM | POA: Diagnosis not present

## 2012-09-16 DIAGNOSIS — M25569 Pain in unspecified knee: Secondary | ICD-10-CM | POA: Diagnosis not present

## 2012-09-23 DIAGNOSIS — Z96659 Presence of unspecified artificial knee joint: Secondary | ICD-10-CM | POA: Diagnosis not present

## 2012-09-25 DIAGNOSIS — R630 Anorexia: Secondary | ICD-10-CM | POA: Diagnosis not present

## 2012-09-26 DIAGNOSIS — R112 Nausea with vomiting, unspecified: Secondary | ICD-10-CM | POA: Diagnosis not present

## 2012-09-26 DIAGNOSIS — F411 Generalized anxiety disorder: Secondary | ICD-10-CM | POA: Diagnosis not present

## 2012-09-29 DIAGNOSIS — R630 Anorexia: Secondary | ICD-10-CM | POA: Diagnosis not present

## 2012-09-29 DIAGNOSIS — R5381 Other malaise: Secondary | ICD-10-CM | POA: Diagnosis not present

## 2012-10-01 DIAGNOSIS — R5383 Other fatigue: Secondary | ICD-10-CM | POA: Diagnosis not present

## 2012-10-01 DIAGNOSIS — R5381 Other malaise: Secondary | ICD-10-CM | POA: Diagnosis not present

## 2012-10-01 DIAGNOSIS — R630 Anorexia: Secondary | ICD-10-CM | POA: Diagnosis not present

## 2012-10-01 DIAGNOSIS — R112 Nausea with vomiting, unspecified: Secondary | ICD-10-CM | POA: Diagnosis not present

## 2012-10-03 DIAGNOSIS — R630 Anorexia: Secondary | ICD-10-CM | POA: Diagnosis not present

## 2012-10-03 DIAGNOSIS — F411 Generalized anxiety disorder: Secondary | ICD-10-CM | POA: Diagnosis not present

## 2012-10-03 DIAGNOSIS — R112 Nausea with vomiting, unspecified: Secondary | ICD-10-CM | POA: Diagnosis not present

## 2012-10-04 DIAGNOSIS — M25619 Stiffness of unspecified shoulder, not elsewhere classified: Secondary | ICD-10-CM | POA: Diagnosis not present

## 2012-10-07 DIAGNOSIS — M25519 Pain in unspecified shoulder: Secondary | ICD-10-CM | POA: Diagnosis not present

## 2012-10-07 DIAGNOSIS — R1013 Epigastric pain: Secondary | ICD-10-CM | POA: Diagnosis not present

## 2012-10-20 DIAGNOSIS — M7530 Calcific tendinitis of unspecified shoulder: Secondary | ICD-10-CM | POA: Diagnosis not present

## 2012-10-23 DIAGNOSIS — N3020 Other chronic cystitis without hematuria: Secondary | ICD-10-CM | POA: Diagnosis not present

## 2012-10-23 DIAGNOSIS — R630 Anorexia: Secondary | ICD-10-CM | POA: Diagnosis not present

## 2012-10-23 DIAGNOSIS — R112 Nausea with vomiting, unspecified: Secondary | ICD-10-CM | POA: Diagnosis not present

## 2012-10-23 DIAGNOSIS — F411 Generalized anxiety disorder: Secondary | ICD-10-CM | POA: Diagnosis not present

## 2012-10-23 DIAGNOSIS — I10 Essential (primary) hypertension: Secondary | ICD-10-CM | POA: Diagnosis not present

## 2012-11-06 DIAGNOSIS — N390 Urinary tract infection, site not specified: Secondary | ICD-10-CM | POA: Diagnosis not present

## 2012-11-06 DIAGNOSIS — N368 Other specified disorders of urethra: Secondary | ICD-10-CM | POA: Diagnosis not present

## 2012-11-06 DIAGNOSIS — N952 Postmenopausal atrophic vaginitis: Secondary | ICD-10-CM | POA: Diagnosis not present

## 2012-11-17 DIAGNOSIS — M7530 Calcific tendinitis of unspecified shoulder: Secondary | ICD-10-CM | POA: Diagnosis not present

## 2012-12-08 ENCOUNTER — Telehealth: Payer: Self-pay | Admitting: Pulmonary Disease

## 2012-12-08 DIAGNOSIS — J984 Other disorders of lung: Secondary | ICD-10-CM

## 2012-12-08 NOTE — Telephone Encounter (Signed)
Pt is scheduled to see RA 12/16/12. Please advise PCC's pt needs CT prior thanks

## 2012-12-08 NOTE — Telephone Encounter (Signed)
Pt called back and is aware of this appt Tina Curry

## 2012-12-08 NOTE — Telephone Encounter (Signed)
Chest ct scheduled for 12/12/12@1 :00pm LMTCB Tobe Sos

## 2012-12-12 ENCOUNTER — Ambulatory Visit (INDEPENDENT_AMBULATORY_CARE_PROVIDER_SITE_OTHER)
Admission: RE | Admit: 2012-12-12 | Discharge: 2012-12-12 | Disposition: A | Discharge status: Home / Self Care | Payer: Medicare Other | Source: Ambulatory Visit | Attending: Pulmonary Disease | Admitting: Pulmonary Disease

## 2012-12-12 DIAGNOSIS — J984 Other disorders of lung: Secondary | ICD-10-CM

## 2012-12-12 DIAGNOSIS — R911 Solitary pulmonary nodule: Secondary | ICD-10-CM | POA: Diagnosis not present

## 2012-12-16 ENCOUNTER — Ambulatory Visit (INDEPENDENT_AMBULATORY_CARE_PROVIDER_SITE_OTHER): Payer: Medicare Other | Admitting: Pulmonary Disease

## 2012-12-16 ENCOUNTER — Encounter: Payer: Self-pay | Admitting: Pulmonary Disease

## 2012-12-16 VITALS — BP 122/70 | HR 75 | Temp 96.6°F | Ht 65.0 in | Wt 156.0 lb

## 2012-12-16 DIAGNOSIS — J984 Other disorders of lung: Secondary | ICD-10-CM

## 2012-12-16 DIAGNOSIS — G4733 Obstructive sleep apnea (adult) (pediatric): Secondary | ICD-10-CM

## 2012-12-16 NOTE — Progress Notes (Signed)
  Subjective:    Patient ID: Tina Curry, female    DOB: Apr 18, 1941, 72 y.o.   MRN: [id number]  HPI  PCP - Netta Cedars   72/F for FU of RUL pulmonary nodule.  Initial OV 07/13/09  Review of old images - CXRs dated 7/03, 10/06, 11/07, 10/10 (gall bladder surgery) are essentially nml. She reports dyspnea x 1 year especially on climbing a steep hill. CXr on 2/21 /11 performed for this reason showed a RUL nodular infiltrate. Ct chest in 3/11 ([facility]) showed 2.7 x 2.1 cm RUl spiculated ill defined nodular infiltrate . She reports occasional cough with white phlegm that she has attributed to allergies. She has been on nitrofurantoin since 2009 after bladder tack surgery. Spirometry showed FVC of 70%, no airway obstruction.  Pet scan 3/11 >> low SUV 3.0 uptake. Pulm arteries & heart appear enlarged. Reports neg cath in 2005  Exercise cardiac stress test neg for ischemia 7/11.  FU chest CT jan'12 shows more prominent RUL nodule with surrounding smaller nodules ? infectious  Feb'12 bronchoscopy >> cytolgy neg, cx neg, afb / fungal neg  05/07/2011 back on nitrofurantoin   PSG 10/12 showed mild OSa with [facility] 9/h, predominant hypopneas, she does not tolerate CPAP well & requests my opinion about this.   12/16/2012 1 yr FU Had rt knee replaced - needed long course of abx for UTI - lost wt - now on trimethoprim CT shows stable branching RUL nodule, stable since 2011, no new lesions C/o dry cough - no wheeze, no sputum    Review of Systems neg for any significant sore throat, dysphagia, itching, sneezing, nasal congestion or excess/ purulent secretions, fever, chills, sweats, unintended wt loss, pleuritic or exertional cp, hempoptysis, orthopnea pnd or change in chronic leg swelling. Also denies presyncope, palpitations, heartburn, abdominal pain, nausea, vomiting, diarrhea or change in bowel or urinary habits, dysuria,hematuria, rash, arthralgias, visual complaints, headache, numbness weakness or  ataxia.     Objective:   Physical Exam  Gen. Pleasant, well-nourished, in no distress ENT - no lesions, no post nasal drip Neck: No JVD, no thyromegaly, no carotid bruits Lungs: no use of accessory muscles, no dullness to percussion, clear without rales or rhonchi  Cardiovascular: Rhythm regular, heart sounds  normal, no murmurs or gallops, no peripheral edema Musculoskeletal: No deformities, no cyanosis or clubbing        Assessment & Plan:

## 2012-12-16 NOTE — Assessment & Plan Note (Signed)
Lost wt - ok to stay off cpap

## 2012-12-16 NOTE — Assessment & Plan Note (Addendum)
CT 8/14 Stable RUL nodule - unchanged since 12/09/2009 and is most likely benign.  Stable small nodules Neg FOB   No more FU imaging required Cough may be due to lisinopril

## 2012-12-16 NOTE — Patient Instructions (Signed)
Nodule is stable x 3 years  Lisinopril (Altace) can cause a dry cough as a side effect Call as needed

## 2012-12-19 DIAGNOSIS — H2510 Age-related nuclear cataract, unspecified eye: Secondary | ICD-10-CM | POA: Diagnosis not present

## 2012-12-19 DIAGNOSIS — I10 Essential (primary) hypertension: Secondary | ICD-10-CM | POA: Diagnosis not present

## 2012-12-23 DIAGNOSIS — Z96659 Presence of unspecified artificial knee joint: Secondary | ICD-10-CM | POA: Diagnosis not present

## 2012-12-23 DIAGNOSIS — N3000 Acute cystitis without hematuria: Secondary | ICD-10-CM | POA: Diagnosis not present

## 2012-12-23 DIAGNOSIS — N183 Chronic kidney disease, stage 3 unspecified: Secondary | ICD-10-CM | POA: Diagnosis not present

## 2012-12-23 DIAGNOSIS — I10 Essential (primary) hypertension: Secondary | ICD-10-CM | POA: Diagnosis not present

## 2012-12-23 DIAGNOSIS — N390 Urinary tract infection, site not specified: Secondary | ICD-10-CM | POA: Diagnosis not present

## 2012-12-23 DIAGNOSIS — E7800 Pure hypercholesterolemia, unspecified: Secondary | ICD-10-CM | POA: Diagnosis not present

## 2013-01-01 DIAGNOSIS — H26499 Other secondary cataract, unspecified eye: Secondary | ICD-10-CM | POA: Diagnosis not present

## 2013-01-08 DIAGNOSIS — N200 Calculus of kidney: Secondary | ICD-10-CM | POA: Diagnosis not present

## 2013-01-08 DIAGNOSIS — R339 Retention of urine, unspecified: Secondary | ICD-10-CM | POA: Diagnosis not present

## 2013-01-08 DIAGNOSIS — N390 Urinary tract infection, site not specified: Secondary | ICD-10-CM | POA: Diagnosis not present

## 2013-01-08 DIAGNOSIS — N289 Disorder of kidney and ureter, unspecified: Secondary | ICD-10-CM | POA: Diagnosis not present

## 2013-01-08 DIAGNOSIS — N952 Postmenopausal atrophic vaginitis: Secondary | ICD-10-CM | POA: Diagnosis not present

## 2013-01-16 DIAGNOSIS — Z23 Encounter for immunization: Secondary | ICD-10-CM | POA: Diagnosis not present

## 2013-01-30 DIAGNOSIS — H5710 Ocular pain, unspecified eye: Secondary | ICD-10-CM | POA: Diagnosis not present

## 2013-02-23 DIAGNOSIS — I10 Essential (primary) hypertension: Secondary | ICD-10-CM | POA: Diagnosis not present

## 2013-02-23 DIAGNOSIS — Z006 Encounter for examination for normal comparison and control in clinical research program: Secondary | ICD-10-CM | POA: Diagnosis not present

## 2013-02-23 DIAGNOSIS — N183 Chronic kidney disease, stage 3 unspecified: Secondary | ICD-10-CM | POA: Diagnosis not present

## 2013-02-23 DIAGNOSIS — Z0000 Encounter for general adult medical examination without abnormal findings: Secondary | ICD-10-CM | POA: Diagnosis not present

## 2013-02-23 DIAGNOSIS — E7800 Pure hypercholesterolemia, unspecified: Secondary | ICD-10-CM | POA: Diagnosis not present

## 2013-03-11 DIAGNOSIS — N390 Urinary tract infection, site not specified: Secondary | ICD-10-CM | POA: Diagnosis not present

## 2013-03-11 DIAGNOSIS — N952 Postmenopausal atrophic vaginitis: Secondary | ICD-10-CM | POA: Diagnosis not present

## 2013-03-30 DIAGNOSIS — Z1231 Encounter for screening mammogram for malignant neoplasm of breast: Secondary | ICD-10-CM | POA: Diagnosis not present

## 2013-04-06 DIAGNOSIS — Z01419 Encounter for gynecological examination (general) (routine) without abnormal findings: Secondary | ICD-10-CM | POA: Diagnosis not present

## 2013-04-09 DIAGNOSIS — I10 Essential (primary) hypertension: Secondary | ICD-10-CM | POA: Diagnosis not present

## 2013-04-28 DIAGNOSIS — N390 Urinary tract infection, site not specified: Secondary | ICD-10-CM | POA: Diagnosis not present

## 2013-05-21 DIAGNOSIS — N183 Chronic kidney disease, stage 3 unspecified: Secondary | ICD-10-CM | POA: Diagnosis not present

## 2013-05-21 DIAGNOSIS — I10 Essential (primary) hypertension: Secondary | ICD-10-CM | POA: Diagnosis not present

## 2013-06-04 DIAGNOSIS — C4441 Basal cell carcinoma of skin of scalp and neck: Secondary | ICD-10-CM | POA: Diagnosis not present

## 2013-06-04 DIAGNOSIS — C44319 Basal cell carcinoma of skin of other parts of face: Secondary | ICD-10-CM | POA: Diagnosis not present

## 2013-06-04 DIAGNOSIS — L578 Other skin changes due to chronic exposure to nonionizing radiation: Secondary | ICD-10-CM | POA: Diagnosis not present

## 2013-06-04 DIAGNOSIS — L819 Disorder of pigmentation, unspecified: Secondary | ICD-10-CM | POA: Diagnosis not present

## 2013-06-04 DIAGNOSIS — L570 Actinic keratosis: Secondary | ICD-10-CM | POA: Diagnosis not present

## 2013-06-11 DIAGNOSIS — N390 Urinary tract infection, site not specified: Secondary | ICD-10-CM | POA: Diagnosis not present

## 2013-06-11 DIAGNOSIS — N3289 Other specified disorders of bladder: Secondary | ICD-10-CM | POA: Diagnosis not present

## 2013-06-11 DIAGNOSIS — K5900 Constipation, unspecified: Secondary | ICD-10-CM | POA: Diagnosis not present

## 2013-06-19 DIAGNOSIS — J019 Acute sinusitis, unspecified: Secondary | ICD-10-CM | POA: Diagnosis not present

## 2013-06-22 DIAGNOSIS — N390 Urinary tract infection, site not specified: Secondary | ICD-10-CM | POA: Diagnosis not present

## 2013-06-23 DIAGNOSIS — E785 Hyperlipidemia, unspecified: Secondary | ICD-10-CM | POA: Diagnosis not present

## 2013-06-23 DIAGNOSIS — I10 Essential (primary) hypertension: Secondary | ICD-10-CM | POA: Diagnosis not present

## 2013-07-02 DIAGNOSIS — M1710 Unilateral primary osteoarthritis, unspecified knee: Secondary | ICD-10-CM | POA: Diagnosis not present

## 2013-07-02 DIAGNOSIS — M7710 Lateral epicondylitis, unspecified elbow: Secondary | ICD-10-CM | POA: Diagnosis not present

## 2013-07-02 DIAGNOSIS — R21 Rash and other nonspecific skin eruption: Secondary | ICD-10-CM | POA: Diagnosis not present

## 2013-07-02 DIAGNOSIS — I10 Essential (primary) hypertension: Secondary | ICD-10-CM | POA: Diagnosis not present

## 2013-07-07 DIAGNOSIS — Z96659 Presence of unspecified artificial knee joint: Secondary | ICD-10-CM | POA: Diagnosis not present

## 2013-07-07 DIAGNOSIS — M1990 Unspecified osteoarthritis, unspecified site: Secondary | ICD-10-CM | POA: Diagnosis not present

## 2013-07-13 DIAGNOSIS — H2510 Age-related nuclear cataract, unspecified eye: Secondary | ICD-10-CM | POA: Diagnosis not present

## 2013-07-20 DIAGNOSIS — IMO0002 Reserved for concepts with insufficient information to code with codable children: Secondary | ICD-10-CM | POA: Diagnosis not present

## 2013-07-20 DIAGNOSIS — M75100 Unspecified rotator cuff tear or rupture of unspecified shoulder, not specified as traumatic: Secondary | ICD-10-CM | POA: Diagnosis not present

## 2013-07-22 DIAGNOSIS — C4490 Unspecified malignant neoplasm of skin, unspecified: Secondary | ICD-10-CM | POA: Diagnosis not present

## 2013-07-27 DIAGNOSIS — M75100 Unspecified rotator cuff tear or rupture of unspecified shoulder, not specified as traumatic: Secondary | ICD-10-CM | POA: Diagnosis not present

## 2013-07-27 DIAGNOSIS — IMO0002 Reserved for concepts with insufficient information to code with codable children: Secondary | ICD-10-CM | POA: Diagnosis not present

## 2013-07-30 DIAGNOSIS — N390 Urinary tract infection, site not specified: Secondary | ICD-10-CM | POA: Diagnosis not present

## 2013-07-31 DIAGNOSIS — IMO0002 Reserved for concepts with insufficient information to code with codable children: Secondary | ICD-10-CM | POA: Diagnosis not present

## 2013-07-31 DIAGNOSIS — M75100 Unspecified rotator cuff tear or rupture of unspecified shoulder, not specified as traumatic: Secondary | ICD-10-CM | POA: Diagnosis not present

## 2013-08-03 DIAGNOSIS — M25519 Pain in unspecified shoulder: Secondary | ICD-10-CM | POA: Diagnosis not present

## 2013-08-03 DIAGNOSIS — M7530 Calcific tendinitis of unspecified shoulder: Secondary | ICD-10-CM | POA: Diagnosis not present

## 2013-08-10 DIAGNOSIS — B370 Candidal stomatitis: Secondary | ICD-10-CM | POA: Diagnosis not present

## 2013-08-11 DIAGNOSIS — N390 Urinary tract infection, site not specified: Secondary | ICD-10-CM | POA: Diagnosis not present

## 2013-08-25 DIAGNOSIS — Z23 Encounter for immunization: Secondary | ICD-10-CM | POA: Diagnosis not present

## 2013-08-25 DIAGNOSIS — S61409A Unspecified open wound of unspecified hand, initial encounter: Secondary | ICD-10-CM | POA: Diagnosis not present

## 2013-08-28 DIAGNOSIS — I10 Essential (primary) hypertension: Secondary | ICD-10-CM | POA: Diagnosis not present

## 2013-08-28 DIAGNOSIS — T07XXXA Unspecified multiple injuries, initial encounter: Secondary | ICD-10-CM | POA: Diagnosis not present

## 2013-08-28 DIAGNOSIS — S4980XA Other specified injuries of shoulder and upper arm, unspecified arm, initial encounter: Secondary | ICD-10-CM | POA: Diagnosis not present

## 2013-08-28 DIAGNOSIS — S72009D Fracture of unspecified part of neck of unspecified femur, subsequent encounter for closed fracture with routine healing: Secondary | ICD-10-CM | POA: Diagnosis not present

## 2013-08-28 DIAGNOSIS — K219 Gastro-esophageal reflux disease without esophagitis: Secondary | ICD-10-CM | POA: Diagnosis not present

## 2013-08-28 DIAGNOSIS — S199XXA Unspecified injury of neck, initial encounter: Secondary | ICD-10-CM | POA: Diagnosis not present

## 2013-08-28 DIAGNOSIS — R071 Chest pain on breathing: Secondary | ICD-10-CM | POA: Diagnosis present

## 2013-08-28 DIAGNOSIS — Z79899 Other long term (current) drug therapy: Secondary | ICD-10-CM | POA: Diagnosis not present

## 2013-08-28 DIAGNOSIS — W010XXA Fall on same level from slipping, tripping and stumbling without subsequent striking against object, initial encounter: Secondary | ICD-10-CM | POA: Diagnosis not present

## 2013-08-28 DIAGNOSIS — S72043A Displaced fracture of base of neck of unspecified femur, initial encounter for closed fracture: Secondary | ICD-10-CM | POA: Diagnosis not present

## 2013-08-28 DIAGNOSIS — S72033A Displaced midcervical fracture of unspecified femur, initial encounter for closed fracture: Secondary | ICD-10-CM | POA: Diagnosis not present

## 2013-08-28 DIAGNOSIS — S0993XA Unspecified injury of face, initial encounter: Secondary | ICD-10-CM | POA: Diagnosis not present

## 2013-08-28 DIAGNOSIS — K5900 Constipation, unspecified: Secondary | ICD-10-CM | POA: Diagnosis present

## 2013-08-28 DIAGNOSIS — F411 Generalized anxiety disorder: Secondary | ICD-10-CM | POA: Diagnosis present

## 2013-08-28 DIAGNOSIS — S72009A Fracture of unspecified part of neck of unspecified femur, initial encounter for closed fracture: Secondary | ICD-10-CM | POA: Diagnosis not present

## 2013-08-28 DIAGNOSIS — M1990 Unspecified osteoarthritis, unspecified site: Secondary | ICD-10-CM | POA: Diagnosis present

## 2013-08-28 DIAGNOSIS — E785 Hyperlipidemia, unspecified: Secondary | ICD-10-CM | POA: Diagnosis not present

## 2013-08-28 DIAGNOSIS — S0990XA Unspecified injury of head, initial encounter: Secondary | ICD-10-CM | POA: Diagnosis not present

## 2013-08-28 DIAGNOSIS — S72143A Displaced intertrochanteric fracture of unspecified femur, initial encounter for closed fracture: Secondary | ICD-10-CM | POA: Diagnosis not present

## 2013-08-28 DIAGNOSIS — M25519 Pain in unspecified shoulder: Secondary | ICD-10-CM | POA: Diagnosis not present

## 2013-08-28 DIAGNOSIS — J9620 Acute and chronic respiratory failure, unspecified whether with hypoxia or hypercapnia: Secondary | ICD-10-CM | POA: Diagnosis not present

## 2013-08-28 DIAGNOSIS — S46909A Unspecified injury of unspecified muscle, fascia and tendon at shoulder and upper arm level, unspecified arm, initial encounter: Secondary | ICD-10-CM | POA: Diagnosis not present

## 2013-08-28 DIAGNOSIS — S298XXA Other specified injuries of thorax, initial encounter: Secondary | ICD-10-CM | POA: Diagnosis not present

## 2013-08-28 DIAGNOSIS — M79609 Pain in unspecified limb: Secondary | ICD-10-CM | POA: Diagnosis not present

## 2013-08-29 DIAGNOSIS — S4980XA Other specified injuries of shoulder and upper arm, unspecified arm, initial encounter: Secondary | ICD-10-CM | POA: Diagnosis not present

## 2013-08-29 DIAGNOSIS — I10 Essential (primary) hypertension: Secondary | ICD-10-CM | POA: Diagnosis not present

## 2013-08-29 DIAGNOSIS — S72009A Fracture of unspecified part of neck of unspecified femur, initial encounter for closed fracture: Secondary | ICD-10-CM | POA: Diagnosis not present

## 2013-08-29 DIAGNOSIS — S72043A Displaced fracture of base of neck of unspecified femur, initial encounter for closed fracture: Secondary | ICD-10-CM | POA: Diagnosis not present

## 2013-08-29 DIAGNOSIS — M25519 Pain in unspecified shoulder: Secondary | ICD-10-CM | POA: Diagnosis not present

## 2013-08-29 DIAGNOSIS — J9620 Acute and chronic respiratory failure, unspecified whether with hypoxia or hypercapnia: Secondary | ICD-10-CM | POA: Diagnosis not present

## 2013-08-29 DIAGNOSIS — E785 Hyperlipidemia, unspecified: Secondary | ICD-10-CM | POA: Diagnosis not present

## 2013-08-30 DIAGNOSIS — J9620 Acute and chronic respiratory failure, unspecified whether with hypoxia or hypercapnia: Secondary | ICD-10-CM | POA: Diagnosis not present

## 2013-08-30 DIAGNOSIS — S72009A Fracture of unspecified part of neck of unspecified femur, initial encounter for closed fracture: Secondary | ICD-10-CM | POA: Diagnosis not present

## 2013-08-30 DIAGNOSIS — E785 Hyperlipidemia, unspecified: Secondary | ICD-10-CM | POA: Diagnosis not present

## 2013-08-30 DIAGNOSIS — I10 Essential (primary) hypertension: Secondary | ICD-10-CM | POA: Diagnosis not present

## 2013-08-31 DIAGNOSIS — J9620 Acute and chronic respiratory failure, unspecified whether with hypoxia or hypercapnia: Secondary | ICD-10-CM | POA: Diagnosis not present

## 2013-08-31 DIAGNOSIS — S72009A Fracture of unspecified part of neck of unspecified femur, initial encounter for closed fracture: Secondary | ICD-10-CM | POA: Diagnosis not present

## 2013-08-31 DIAGNOSIS — I10 Essential (primary) hypertension: Secondary | ICD-10-CM | POA: Diagnosis not present

## 2013-08-31 DIAGNOSIS — E785 Hyperlipidemia, unspecified: Secondary | ICD-10-CM | POA: Diagnosis not present

## 2013-09-01 DIAGNOSIS — I10 Essential (primary) hypertension: Secondary | ICD-10-CM | POA: Diagnosis not present

## 2013-09-01 DIAGNOSIS — E7889 Other lipoprotein metabolism disorders: Secondary | ICD-10-CM | POA: Diagnosis not present

## 2013-09-01 DIAGNOSIS — S72009A Fracture of unspecified part of neck of unspecified femur, initial encounter for closed fracture: Secondary | ICD-10-CM | POA: Diagnosis not present

## 2013-09-01 DIAGNOSIS — D649 Anemia, unspecified: Secondary | ICD-10-CM | POA: Diagnosis not present

## 2013-09-01 DIAGNOSIS — K219 Gastro-esophageal reflux disease without esophagitis: Secondary | ICD-10-CM | POA: Diagnosis not present

## 2013-09-01 DIAGNOSIS — S72009D Fracture of unspecified part of neck of unspecified femur, subsequent encounter for closed fracture with routine healing: Secondary | ICD-10-CM | POA: Diagnosis not present

## 2013-09-01 DIAGNOSIS — I130 Hypertensive heart and chronic kidney disease with heart failure and stage 1 through stage 4 chronic kidney disease, or unspecified chronic kidney disease: Secondary | ICD-10-CM | POA: Diagnosis not present

## 2013-09-01 DIAGNOSIS — N039 Chronic nephritic syndrome with unspecified morphologic changes: Secondary | ICD-10-CM | POA: Diagnosis not present

## 2013-09-01 DIAGNOSIS — S72143A Displaced intertrochanteric fracture of unspecified femur, initial encounter for closed fracture: Secondary | ICD-10-CM | POA: Diagnosis not present

## 2013-09-01 DIAGNOSIS — E785 Hyperlipidemia, unspecified: Secondary | ICD-10-CM | POA: Diagnosis not present

## 2013-09-01 DIAGNOSIS — D638 Anemia in other chronic diseases classified elsewhere: Secondary | ICD-10-CM | POA: Diagnosis not present

## 2013-09-01 DIAGNOSIS — K5900 Constipation, unspecified: Secondary | ICD-10-CM | POA: Diagnosis not present

## 2013-09-02 DIAGNOSIS — E785 Hyperlipidemia, unspecified: Secondary | ICD-10-CM | POA: Diagnosis not present

## 2013-09-02 DIAGNOSIS — D638 Anemia in other chronic diseases classified elsewhere: Secondary | ICD-10-CM | POA: Diagnosis not present

## 2013-09-02 DIAGNOSIS — S72143A Displaced intertrochanteric fracture of unspecified femur, initial encounter for closed fracture: Secondary | ICD-10-CM | POA: Diagnosis not present

## 2013-09-02 DIAGNOSIS — I130 Hypertensive heart and chronic kidney disease with heart failure and stage 1 through stage 4 chronic kidney disease, or unspecified chronic kidney disease: Secondary | ICD-10-CM | POA: Diagnosis not present

## 2013-09-15 DIAGNOSIS — M7530 Calcific tendinitis of unspecified shoulder: Secondary | ICD-10-CM | POA: Diagnosis not present

## 2013-09-18 IMAGING — CT CT CHEST W/O CM
2 of 3 series · 15 of 36 positions shown, 18 images · IV contrast (Omnipaque 300)
Comparison: 11/01/2011

CLINICAL DATA: evaluate pulmonary nodule

CT CHEST WITHOUT CONTRAST
TECHNIQUE: Multidetector CT imaging of the chest was performed
following the standard protocol without IV contrast.

[Series 2: chest routine with · axial · 0.71mm/px · z∈[+1132,+1367]mm · 12 of 57 slices shown, 15 images]
[im 5/57  mediastinal]
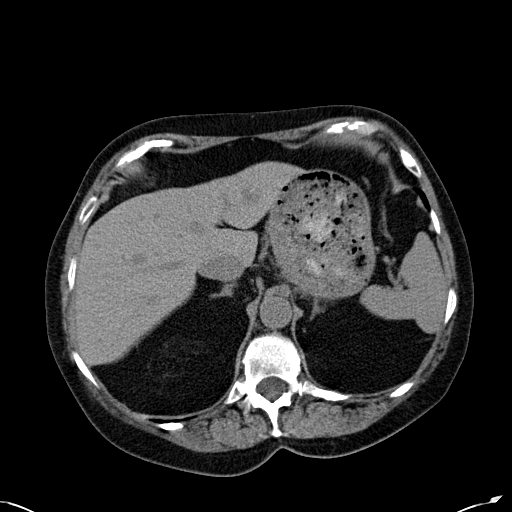
[im 5/57  lung]
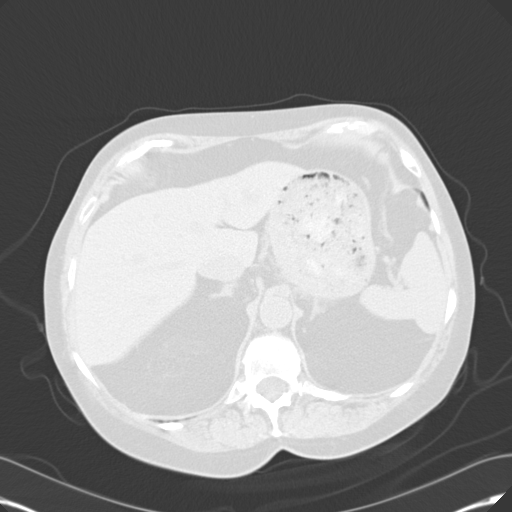
[im 9/57  lung]
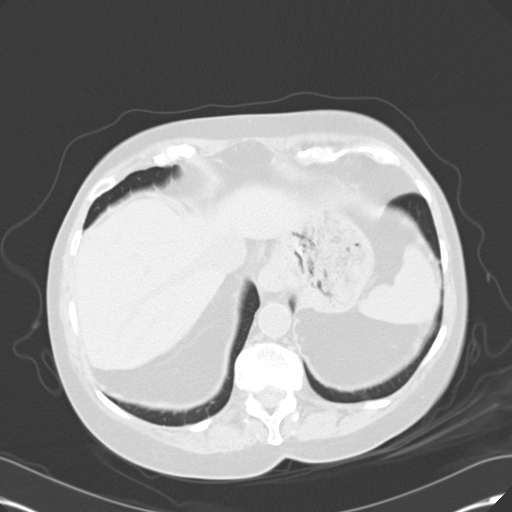
[im 13/57  lung]
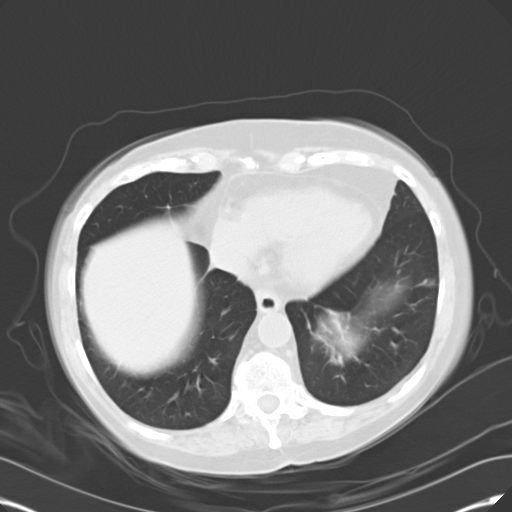
[im 17/57  lung]
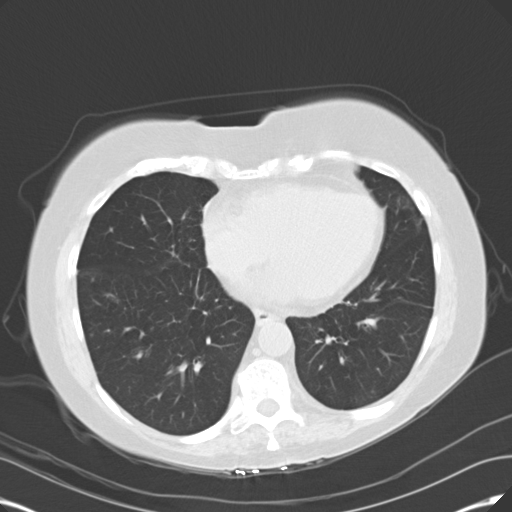
[im 21/57  mediastinal]
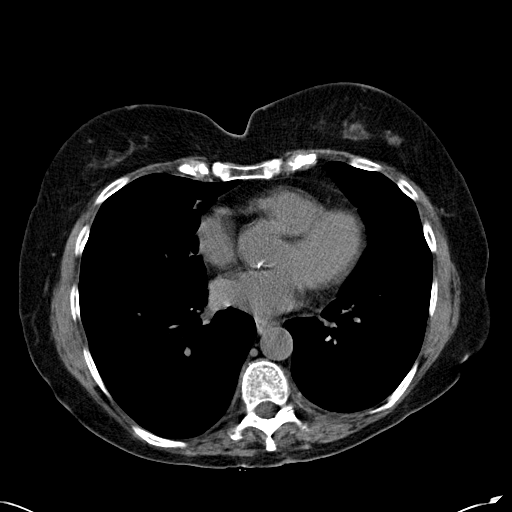
[im 21/57  lung]
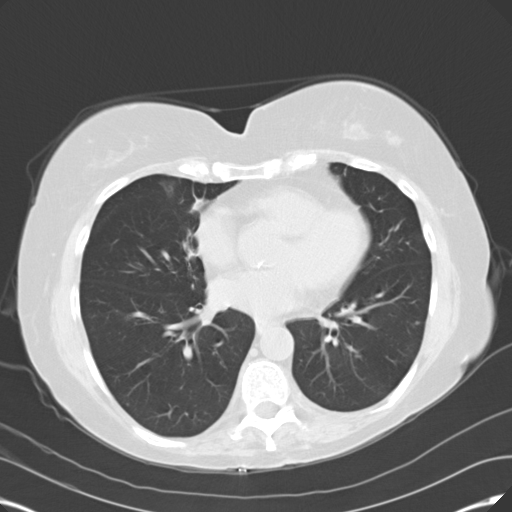
[im 25/57  lung]
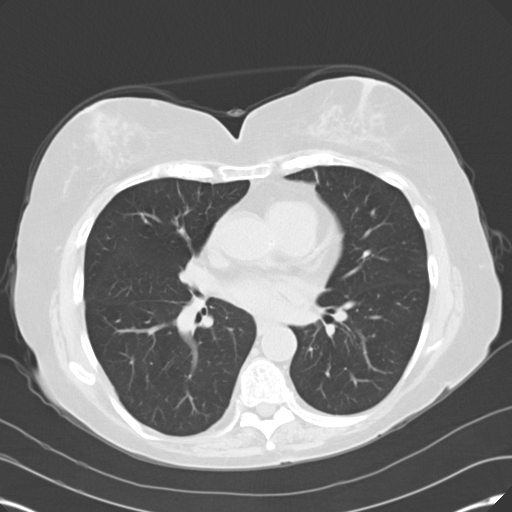
[im 32/57  lung]
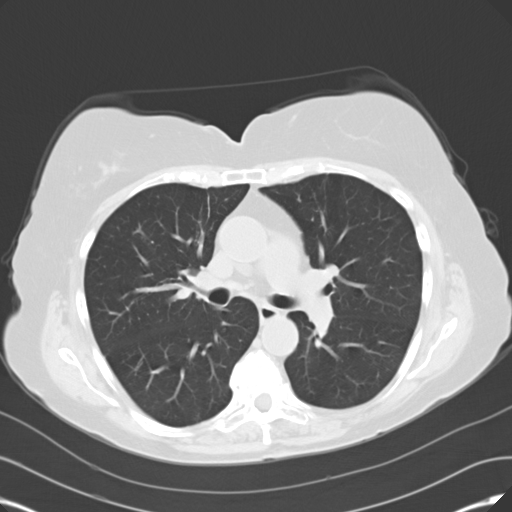
[im 36/57  lung]
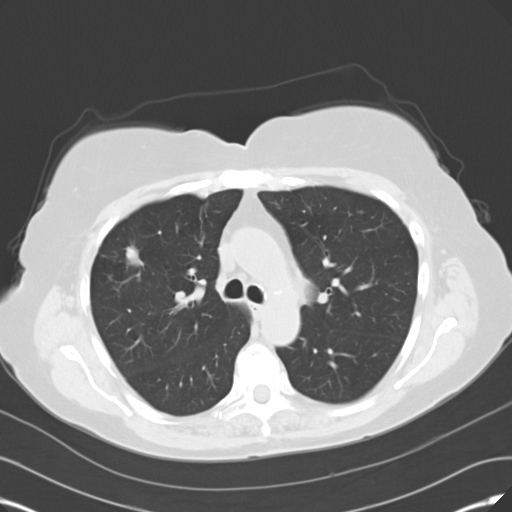
[im 40/57  mediastinal]
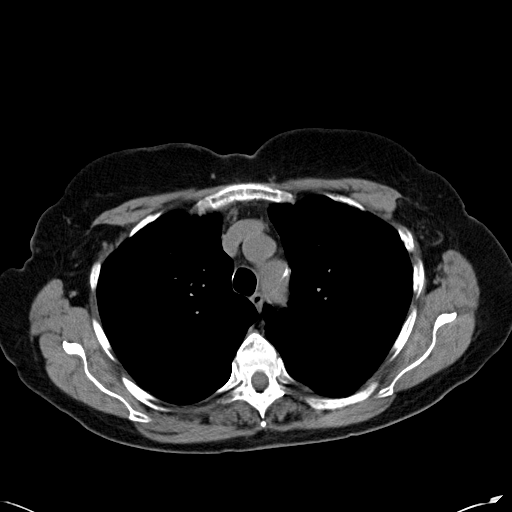
[im 40/57  lung]
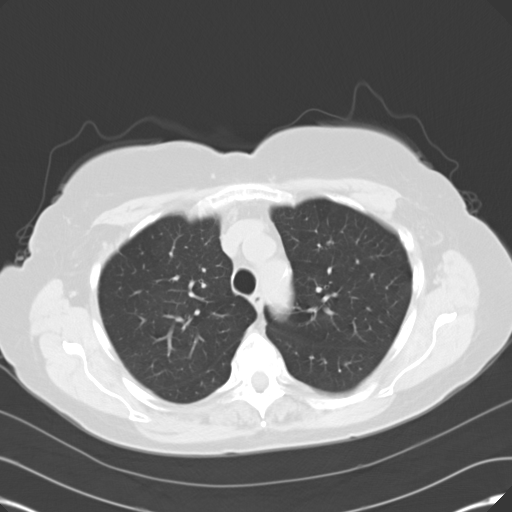
[im 44/57  lung]
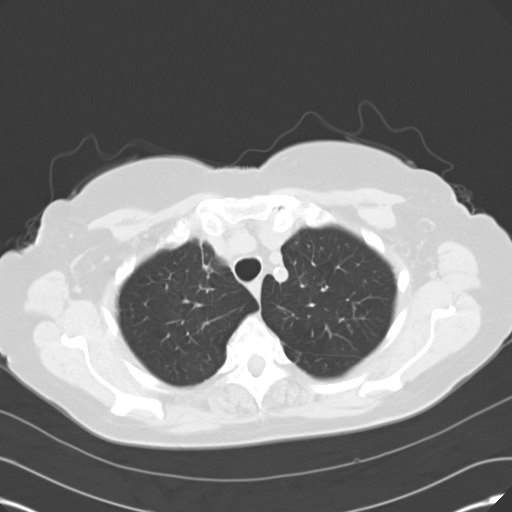
[im 48/57  lung]
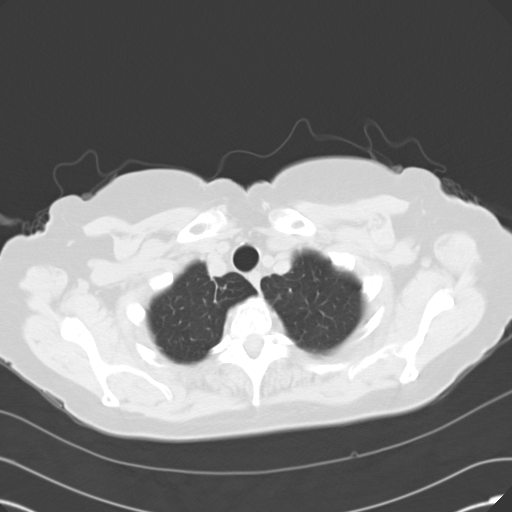
[im 52/57  lung]
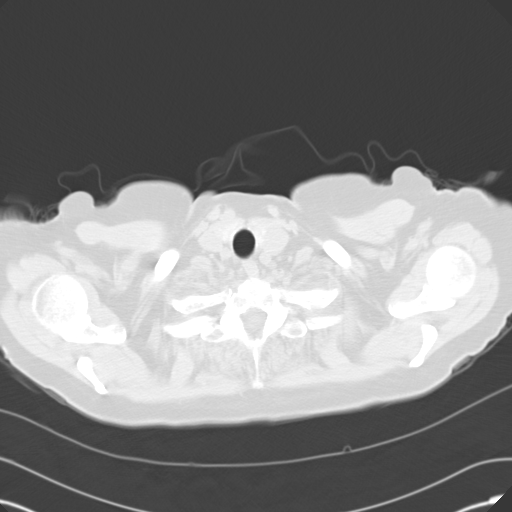

[Series 602: cor · coronal · 0.71mm/px · 3 of 125 slices shown]
[im 25/125  lung]
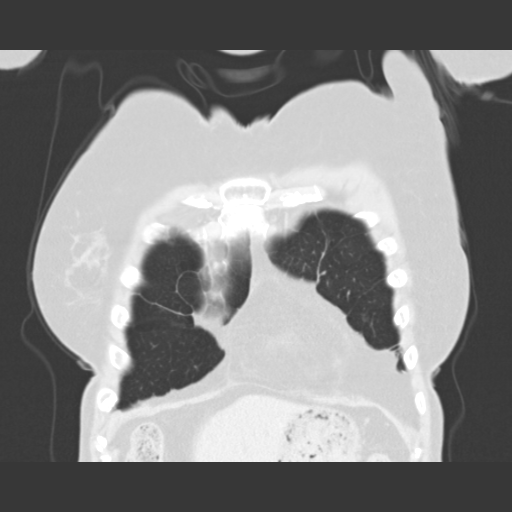
[im 50/125  lung]
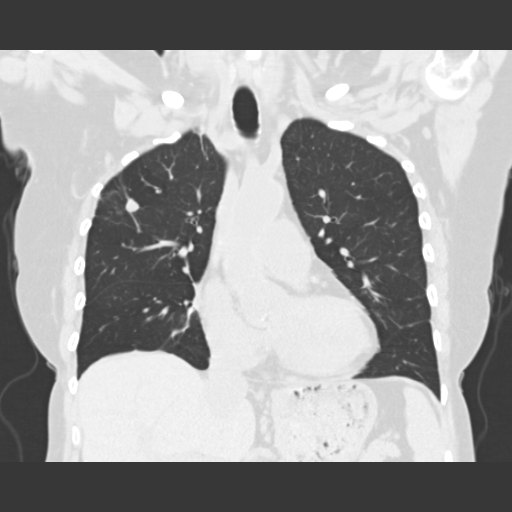
[im 75/125  lung]
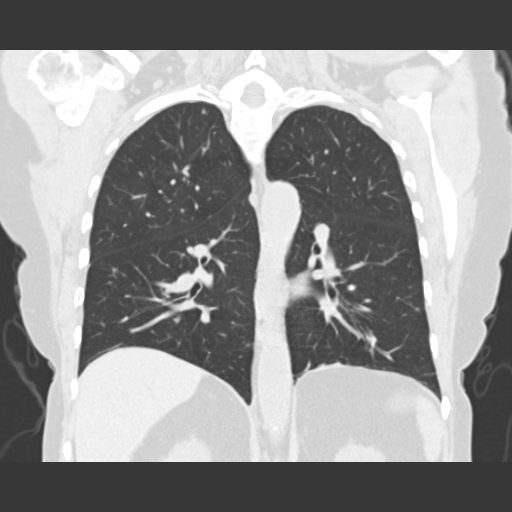

[15 of 36 positions shown; findings below may reference images not displayed]

FINDINGS: No pleural effusion identified.  No airspace
consolidation identified.  Branching tubular density within the
right upper lobe is again noted.  This measures 1.4 x 1.9 cm, image
23/series 3.  This is not significantly changed from 12/09/2009 and
is favored to represent a benign finding.  This could represent an
area of mucoid impaction.  Mild scratched stable nodule in the
superior segment of right lower lobe measuring 4 mm, image
31/series 3. Left upper lobe nodule is unchanged to decreased in
size in the interval.  This measures 5 mm, image 21/series 3.
Previously this measured the 6 mm.

No new or enlarging pulmonary nodules or masses identified.  The
heart size appears normal. No pericardial effusion identified.  No
enlarged mediastinal or hilar lymph nodes. Limited imaging through
the upper abdomen demonstrates no acute findings.

The visualized osseous structures are negative for aggressive lytic
or sclerotic bone lesions identified.  There is mild spondylosis
within the thoracic spine.
IMPRESSION: 1.  No acute cardiopulmonary abnormalities.
2.  Stable tubular, branching density within the right upper lobe
which is favored to represent a benign abnormality.
3.  Faint 6 mm pulmonary parenchymal nodule in the left upper lobe
is stable from previous exam, image number 21/series 3.  This is
favored to represent a benign abnormality as well.

## 2013-09-22 DIAGNOSIS — Z23 Encounter for immunization: Secondary | ICD-10-CM | POA: Diagnosis not present

## 2013-09-22 DIAGNOSIS — I10 Essential (primary) hypertension: Secondary | ICD-10-CM | POA: Diagnosis not present

## 2013-09-22 DIAGNOSIS — D500 Iron deficiency anemia secondary to blood loss (chronic): Secondary | ICD-10-CM | POA: Diagnosis not present

## 2013-09-22 DIAGNOSIS — N183 Chronic kidney disease, stage 3 unspecified: Secondary | ICD-10-CM | POA: Diagnosis not present

## 2013-10-05 DIAGNOSIS — N390 Urinary tract infection, site not specified: Secondary | ICD-10-CM | POA: Diagnosis not present

## 2013-10-15 DIAGNOSIS — K5900 Constipation, unspecified: Secondary | ICD-10-CM | POA: Diagnosis not present

## 2013-10-15 DIAGNOSIS — N390 Urinary tract infection, site not specified: Secondary | ICD-10-CM | POA: Diagnosis not present

## 2013-10-15 DIAGNOSIS — N952 Postmenopausal atrophic vaginitis: Secondary | ICD-10-CM | POA: Diagnosis not present

## 2013-10-20 DIAGNOSIS — M542 Cervicalgia: Secondary | ICD-10-CM | POA: Diagnosis not present

## 2013-10-22 DIAGNOSIS — Z96659 Presence of unspecified artificial knee joint: Secondary | ICD-10-CM | POA: Diagnosis not present

## 2013-10-22 DIAGNOSIS — M1990 Unspecified osteoarthritis, unspecified site: Secondary | ICD-10-CM | POA: Diagnosis not present

## 2013-11-03 DIAGNOSIS — I10 Essential (primary) hypertension: Secondary | ICD-10-CM | POA: Diagnosis not present

## 2013-11-05 DIAGNOSIS — S43429A Sprain of unspecified rotator cuff capsule, initial encounter: Secondary | ICD-10-CM | POA: Diagnosis not present

## 2013-11-05 DIAGNOSIS — M67919 Unspecified disorder of synovium and tendon, unspecified shoulder: Secondary | ICD-10-CM | POA: Diagnosis not present

## 2013-11-05 DIAGNOSIS — M25519 Pain in unspecified shoulder: Secondary | ICD-10-CM | POA: Diagnosis not present

## 2013-11-09 DIAGNOSIS — M719 Bursopathy, unspecified: Secondary | ICD-10-CM | POA: Diagnosis not present

## 2013-11-09 DIAGNOSIS — S46819A Strain of other muscles, fascia and tendons at shoulder and upper arm level, unspecified arm, initial encounter: Secondary | ICD-10-CM | POA: Diagnosis not present

## 2013-11-09 DIAGNOSIS — M67919 Unspecified disorder of synovium and tendon, unspecified shoulder: Secondary | ICD-10-CM | POA: Diagnosis not present

## 2013-11-09 DIAGNOSIS — M19019 Primary osteoarthritis, unspecified shoulder: Secondary | ICD-10-CM | POA: Diagnosis not present

## 2013-11-09 DIAGNOSIS — M6688 Spontaneous rupture of other tendons, other: Secondary | ICD-10-CM | POA: Diagnosis not present

## 2013-11-09 DIAGNOSIS — M25519 Pain in unspecified shoulder: Secondary | ICD-10-CM | POA: Diagnosis not present

## 2013-11-10 DIAGNOSIS — L570 Actinic keratosis: Secondary | ICD-10-CM | POA: Diagnosis not present

## 2013-11-12 DIAGNOSIS — M25519 Pain in unspecified shoulder: Secondary | ICD-10-CM | POA: Diagnosis not present

## 2013-11-12 DIAGNOSIS — M67919 Unspecified disorder of synovium and tendon, unspecified shoulder: Secondary | ICD-10-CM | POA: Diagnosis not present

## 2013-11-12 DIAGNOSIS — S43429A Sprain of unspecified rotator cuff capsule, initial encounter: Secondary | ICD-10-CM | POA: Diagnosis not present

## 2013-11-12 DIAGNOSIS — M7530 Calcific tendinitis of unspecified shoulder: Secondary | ICD-10-CM | POA: Diagnosis not present

## 2013-11-12 DIAGNOSIS — M719 Bursopathy, unspecified: Secondary | ICD-10-CM | POA: Diagnosis not present

## 2013-11-16 DIAGNOSIS — N390 Urinary tract infection, site not specified: Secondary | ICD-10-CM | POA: Diagnosis not present

## 2013-11-19 DIAGNOSIS — M25519 Pain in unspecified shoulder: Secondary | ICD-10-CM | POA: Diagnosis not present

## 2013-11-19 DIAGNOSIS — M25619 Stiffness of unspecified shoulder, not elsewhere classified: Secondary | ICD-10-CM | POA: Diagnosis not present

## 2013-11-24 DIAGNOSIS — M25619 Stiffness of unspecified shoulder, not elsewhere classified: Secondary | ICD-10-CM | POA: Diagnosis not present

## 2013-11-24 DIAGNOSIS — M25519 Pain in unspecified shoulder: Secondary | ICD-10-CM | POA: Diagnosis not present

## 2013-11-26 DIAGNOSIS — M25619 Stiffness of unspecified shoulder, not elsewhere classified: Secondary | ICD-10-CM | POA: Diagnosis not present

## 2013-11-26 DIAGNOSIS — M25519 Pain in unspecified shoulder: Secondary | ICD-10-CM | POA: Diagnosis not present

## 2013-12-01 DIAGNOSIS — M25519 Pain in unspecified shoulder: Secondary | ICD-10-CM | POA: Diagnosis not present

## 2013-12-01 DIAGNOSIS — M25619 Stiffness of unspecified shoulder, not elsewhere classified: Secondary | ICD-10-CM | POA: Diagnosis not present

## 2013-12-03 DIAGNOSIS — M25619 Stiffness of unspecified shoulder, not elsewhere classified: Secondary | ICD-10-CM | POA: Diagnosis not present

## 2013-12-03 DIAGNOSIS — M25519 Pain in unspecified shoulder: Secondary | ICD-10-CM | POA: Diagnosis not present

## 2013-12-08 DIAGNOSIS — M25519 Pain in unspecified shoulder: Secondary | ICD-10-CM | POA: Diagnosis not present

## 2013-12-08 DIAGNOSIS — M25619 Stiffness of unspecified shoulder, not elsewhere classified: Secondary | ICD-10-CM | POA: Diagnosis not present

## 2013-12-10 DIAGNOSIS — M25519 Pain in unspecified shoulder: Secondary | ICD-10-CM | POA: Diagnosis not present

## 2013-12-10 DIAGNOSIS — M25619 Stiffness of unspecified shoulder, not elsewhere classified: Secondary | ICD-10-CM | POA: Diagnosis not present

## 2013-12-15 DIAGNOSIS — I10 Essential (primary) hypertension: Secondary | ICD-10-CM | POA: Diagnosis not present

## 2013-12-15 DIAGNOSIS — M25519 Pain in unspecified shoulder: Secondary | ICD-10-CM | POA: Diagnosis not present

## 2013-12-15 DIAGNOSIS — E7800 Pure hypercholesterolemia, unspecified: Secondary | ICD-10-CM | POA: Diagnosis not present

## 2013-12-15 DIAGNOSIS — M25619 Stiffness of unspecified shoulder, not elsewhere classified: Secondary | ICD-10-CM | POA: Diagnosis not present

## 2013-12-17 DIAGNOSIS — M25519 Pain in unspecified shoulder: Secondary | ICD-10-CM | POA: Diagnosis not present

## 2013-12-17 DIAGNOSIS — M25619 Stiffness of unspecified shoulder, not elsewhere classified: Secondary | ICD-10-CM | POA: Diagnosis not present

## 2013-12-17 DIAGNOSIS — S72043A Displaced fracture of base of neck of unspecified femur, initial encounter for closed fracture: Secondary | ICD-10-CM | POA: Diagnosis not present

## 2013-12-23 DIAGNOSIS — E785 Hyperlipidemia, unspecified: Secondary | ICD-10-CM | POA: Diagnosis not present

## 2013-12-23 DIAGNOSIS — R002 Palpitations: Secondary | ICD-10-CM | POA: Diagnosis not present

## 2013-12-23 DIAGNOSIS — I10 Essential (primary) hypertension: Secondary | ICD-10-CM | POA: Diagnosis not present

## 2013-12-23 DIAGNOSIS — R079 Chest pain, unspecified: Secondary | ICD-10-CM | POA: Diagnosis not present

## 2013-12-29 DIAGNOSIS — R079 Chest pain, unspecified: Secondary | ICD-10-CM | POA: Diagnosis not present

## 2013-12-29 DIAGNOSIS — R002 Palpitations: Secondary | ICD-10-CM | POA: Diagnosis not present

## 2013-12-31 DIAGNOSIS — M719 Bursopathy, unspecified: Secondary | ICD-10-CM | POA: Diagnosis not present

## 2013-12-31 DIAGNOSIS — M7530 Calcific tendinitis of unspecified shoulder: Secondary | ICD-10-CM | POA: Diagnosis not present

## 2013-12-31 DIAGNOSIS — M67919 Unspecified disorder of synovium and tendon, unspecified shoulder: Secondary | ICD-10-CM | POA: Diagnosis not present

## 2013-12-31 DIAGNOSIS — S43429A Sprain of unspecified rotator cuff capsule, initial encounter: Secondary | ICD-10-CM | POA: Diagnosis not present

## 2014-01-12 DIAGNOSIS — R079 Chest pain, unspecified: Secondary | ICD-10-CM | POA: Diagnosis not present

## 2014-01-12 DIAGNOSIS — R002 Palpitations: Secondary | ICD-10-CM | POA: Diagnosis not present

## 2014-01-18 DIAGNOSIS — R002 Palpitations: Secondary | ICD-10-CM | POA: Diagnosis not present

## 2014-01-25 DIAGNOSIS — Z96651 Presence of right artificial knee joint: Secondary | ICD-10-CM | POA: Diagnosis not present

## 2014-01-25 DIAGNOSIS — M25561 Pain in right knee: Secondary | ICD-10-CM | POA: Diagnosis not present

## 2014-01-27 DIAGNOSIS — R339 Retention of urine, unspecified: Secondary | ICD-10-CM | POA: Diagnosis not present

## 2014-01-27 DIAGNOSIS — N952 Postmenopausal atrophic vaginitis: Secondary | ICD-10-CM | POA: Diagnosis not present

## 2014-01-27 DIAGNOSIS — K5900 Constipation, unspecified: Secondary | ICD-10-CM | POA: Diagnosis not present

## 2014-01-27 DIAGNOSIS — N3090 Cystitis, unspecified without hematuria: Secondary | ICD-10-CM | POA: Diagnosis not present

## 2014-01-28 DIAGNOSIS — E785 Hyperlipidemia, unspecified: Secondary | ICD-10-CM | POA: Diagnosis not present

## 2014-01-28 DIAGNOSIS — R002 Palpitations: Secondary | ICD-10-CM | POA: Diagnosis not present

## 2014-01-28 DIAGNOSIS — I10 Essential (primary) hypertension: Secondary | ICD-10-CM | POA: Diagnosis not present

## 2014-01-28 DIAGNOSIS — I739 Peripheral vascular disease, unspecified: Secondary | ICD-10-CM | POA: Diagnosis not present

## 2014-02-04 DIAGNOSIS — M25461 Effusion, right knee: Secondary | ICD-10-CM | POA: Diagnosis not present

## 2014-02-04 DIAGNOSIS — M7989 Other specified soft tissue disorders: Secondary | ICD-10-CM | POA: Diagnosis not present

## 2014-02-04 DIAGNOSIS — M25561 Pain in right knee: Secondary | ICD-10-CM | POA: Diagnosis not present

## 2014-02-11 DIAGNOSIS — M75111 Incomplete rotator cuff tear or rupture of right shoulder, not specified as traumatic: Secondary | ICD-10-CM | POA: Diagnosis not present

## 2014-02-11 DIAGNOSIS — M7551 Bursitis of right shoulder: Secondary | ICD-10-CM | POA: Diagnosis not present

## 2014-02-11 DIAGNOSIS — M25511 Pain in right shoulder: Secondary | ICD-10-CM | POA: Diagnosis not present

## 2014-02-15 DIAGNOSIS — Z96651 Presence of right artificial knee joint: Secondary | ICD-10-CM | POA: Diagnosis not present

## 2014-02-15 DIAGNOSIS — M7051 Other bursitis of knee, right knee: Secondary | ICD-10-CM | POA: Diagnosis not present

## 2014-02-16 DIAGNOSIS — Z23 Encounter for immunization: Secondary | ICD-10-CM | POA: Diagnosis not present

## 2014-02-22 DIAGNOSIS — E782 Mixed hyperlipidemia: Secondary | ICD-10-CM | POA: Diagnosis not present

## 2014-02-22 DIAGNOSIS — Z79899 Other long term (current) drug therapy: Secondary | ICD-10-CM | POA: Diagnosis not present

## 2014-02-22 DIAGNOSIS — M1710 Unilateral primary osteoarthritis, unspecified knee: Secondary | ICD-10-CM | POA: Diagnosis not present

## 2014-02-22 DIAGNOSIS — I10 Essential (primary) hypertension: Secondary | ICD-10-CM | POA: Diagnosis not present

## 2014-02-25 DIAGNOSIS — I10 Essential (primary) hypertension: Secondary | ICD-10-CM | POA: Diagnosis not present

## 2014-02-25 DIAGNOSIS — E785 Hyperlipidemia, unspecified: Secondary | ICD-10-CM | POA: Diagnosis not present

## 2014-03-23 DIAGNOSIS — S72041D Displaced fracture of base of neck of right femur, subsequent encounter for closed fracture with routine healing: Secondary | ICD-10-CM | POA: Diagnosis not present

## 2014-03-25 DIAGNOSIS — M7552 Bursitis of left shoulder: Secondary | ICD-10-CM | POA: Diagnosis not present

## 2014-03-25 DIAGNOSIS — M7542 Impingement syndrome of left shoulder: Secondary | ICD-10-CM | POA: Diagnosis not present

## 2014-03-29 DIAGNOSIS — M7532 Calcific tendinitis of left shoulder: Secondary | ICD-10-CM | POA: Diagnosis not present

## 2014-03-29 DIAGNOSIS — M7552 Bursitis of left shoulder: Secondary | ICD-10-CM | POA: Diagnosis not present

## 2014-03-31 DIAGNOSIS — M7530 Calcific tendinitis of unspecified shoulder: Secondary | ICD-10-CM | POA: Diagnosis not present

## 2014-03-31 DIAGNOSIS — M7552 Bursitis of left shoulder: Secondary | ICD-10-CM | POA: Diagnosis not present

## 2014-04-06 DIAGNOSIS — H9190 Unspecified hearing loss, unspecified ear: Secondary | ICD-10-CM | POA: Diagnosis not present

## 2014-04-06 DIAGNOSIS — R600 Localized edema: Secondary | ICD-10-CM | POA: Diagnosis not present

## 2014-04-06 DIAGNOSIS — M7542 Impingement syndrome of left shoulder: Secondary | ICD-10-CM | POA: Diagnosis not present

## 2014-04-06 DIAGNOSIS — M7530 Calcific tendinitis of unspecified shoulder: Secondary | ICD-10-CM | POA: Diagnosis not present

## 2014-04-06 DIAGNOSIS — G8918 Other acute postprocedural pain: Secondary | ICD-10-CM | POA: Diagnosis not present

## 2014-04-06 DIAGNOSIS — M75102 Unspecified rotator cuff tear or rupture of left shoulder, not specified as traumatic: Secondary | ICD-10-CM | POA: Diagnosis not present

## 2014-04-06 DIAGNOSIS — M75112 Incomplete rotator cuff tear or rupture of left shoulder, not specified as traumatic: Secondary | ICD-10-CM | POA: Diagnosis not present

## 2014-04-06 DIAGNOSIS — E785 Hyperlipidemia, unspecified: Secondary | ICD-10-CM | POA: Diagnosis not present

## 2014-04-06 DIAGNOSIS — I10 Essential (primary) hypertension: Secondary | ICD-10-CM | POA: Diagnosis not present

## 2014-04-06 DIAGNOSIS — M25812 Other specified joint disorders, left shoulder: Secondary | ICD-10-CM | POA: Diagnosis not present

## 2014-04-06 DIAGNOSIS — M7552 Bursitis of left shoulder: Secondary | ICD-10-CM | POA: Diagnosis not present

## 2014-04-06 DIAGNOSIS — M1990 Unspecified osteoarthritis, unspecified site: Secondary | ICD-10-CM | POA: Diagnosis not present

## 2014-04-09 DIAGNOSIS — M7532 Calcific tendinitis of left shoulder: Secondary | ICD-10-CM | POA: Diagnosis not present

## 2014-04-09 DIAGNOSIS — M25512 Pain in left shoulder: Secondary | ICD-10-CM | POA: Diagnosis not present

## 2014-04-12 DIAGNOSIS — M25512 Pain in left shoulder: Secondary | ICD-10-CM | POA: Diagnosis not present

## 2014-04-12 DIAGNOSIS — M7532 Calcific tendinitis of left shoulder: Secondary | ICD-10-CM | POA: Diagnosis not present

## 2014-04-14 DIAGNOSIS — M7532 Calcific tendinitis of left shoulder: Secondary | ICD-10-CM | POA: Diagnosis not present

## 2014-04-14 DIAGNOSIS — M25512 Pain in left shoulder: Secondary | ICD-10-CM | POA: Diagnosis not present

## 2014-04-19 DIAGNOSIS — M7532 Calcific tendinitis of left shoulder: Secondary | ICD-10-CM | POA: Diagnosis not present

## 2014-04-19 DIAGNOSIS — M25512 Pain in left shoulder: Secondary | ICD-10-CM | POA: Diagnosis not present

## 2014-04-21 DIAGNOSIS — M25512 Pain in left shoulder: Secondary | ICD-10-CM | POA: Diagnosis not present

## 2014-04-21 DIAGNOSIS — M7532 Calcific tendinitis of left shoulder: Secondary | ICD-10-CM | POA: Diagnosis not present

## 2014-04-22 DIAGNOSIS — M7532 Calcific tendinitis of left shoulder: Secondary | ICD-10-CM | POA: Diagnosis not present

## 2014-04-22 DIAGNOSIS — M25512 Pain in left shoulder: Secondary | ICD-10-CM | POA: Diagnosis not present

## 2014-04-26 DIAGNOSIS — M7532 Calcific tendinitis of left shoulder: Secondary | ICD-10-CM | POA: Diagnosis not present

## 2014-04-26 DIAGNOSIS — M25512 Pain in left shoulder: Secondary | ICD-10-CM | POA: Diagnosis not present

## 2014-04-28 DIAGNOSIS — M25512 Pain in left shoulder: Secondary | ICD-10-CM | POA: Diagnosis not present

## 2014-04-28 DIAGNOSIS — M7532 Calcific tendinitis of left shoulder: Secondary | ICD-10-CM | POA: Diagnosis not present

## 2014-04-30 DIAGNOSIS — M25512 Pain in left shoulder: Secondary | ICD-10-CM | POA: Diagnosis not present

## 2014-04-30 DIAGNOSIS — M7532 Calcific tendinitis of left shoulder: Secondary | ICD-10-CM | POA: Diagnosis not present

## 2014-05-03 DIAGNOSIS — N3090 Cystitis, unspecified without hematuria: Secondary | ICD-10-CM | POA: Diagnosis not present

## 2014-05-03 DIAGNOSIS — M7532 Calcific tendinitis of left shoulder: Secondary | ICD-10-CM | POA: Diagnosis not present

## 2014-05-03 DIAGNOSIS — R339 Retention of urine, unspecified: Secondary | ICD-10-CM | POA: Diagnosis not present

## 2014-05-03 DIAGNOSIS — M25512 Pain in left shoulder: Secondary | ICD-10-CM | POA: Diagnosis not present

## 2014-05-05 DIAGNOSIS — M25512 Pain in left shoulder: Secondary | ICD-10-CM | POA: Diagnosis not present

## 2014-05-05 DIAGNOSIS — M7532 Calcific tendinitis of left shoulder: Secondary | ICD-10-CM | POA: Diagnosis not present

## 2014-05-07 DIAGNOSIS — M7532 Calcific tendinitis of left shoulder: Secondary | ICD-10-CM | POA: Diagnosis not present

## 2014-05-07 DIAGNOSIS — M25512 Pain in left shoulder: Secondary | ICD-10-CM | POA: Diagnosis not present

## 2014-05-10 DIAGNOSIS — M25512 Pain in left shoulder: Secondary | ICD-10-CM | POA: Diagnosis not present

## 2014-05-10 DIAGNOSIS — M7532 Calcific tendinitis of left shoulder: Secondary | ICD-10-CM | POA: Diagnosis not present

## 2014-05-12 DIAGNOSIS — M7532 Calcific tendinitis of left shoulder: Secondary | ICD-10-CM | POA: Diagnosis not present

## 2014-05-12 DIAGNOSIS — M25512 Pain in left shoulder: Secondary | ICD-10-CM | POA: Diagnosis not present

## 2014-05-13 DIAGNOSIS — L821 Other seborrheic keratosis: Secondary | ICD-10-CM | POA: Diagnosis not present

## 2014-05-13 DIAGNOSIS — L570 Actinic keratosis: Secondary | ICD-10-CM | POA: Diagnosis not present

## 2014-05-13 DIAGNOSIS — C44319 Basal cell carcinoma of skin of other parts of face: Secondary | ICD-10-CM | POA: Diagnosis not present

## 2014-05-17 DIAGNOSIS — M7532 Calcific tendinitis of left shoulder: Secondary | ICD-10-CM | POA: Diagnosis not present

## 2014-05-17 DIAGNOSIS — M25512 Pain in left shoulder: Secondary | ICD-10-CM | POA: Diagnosis not present

## 2014-05-19 DIAGNOSIS — M7532 Calcific tendinitis of left shoulder: Secondary | ICD-10-CM | POA: Diagnosis not present

## 2014-05-19 DIAGNOSIS — M25512 Pain in left shoulder: Secondary | ICD-10-CM | POA: Diagnosis not present

## 2014-05-21 DIAGNOSIS — M7532 Calcific tendinitis of left shoulder: Secondary | ICD-10-CM | POA: Diagnosis not present

## 2014-05-21 DIAGNOSIS — M25512 Pain in left shoulder: Secondary | ICD-10-CM | POA: Diagnosis not present

## 2014-05-25 DIAGNOSIS — M25512 Pain in left shoulder: Secondary | ICD-10-CM | POA: Diagnosis not present

## 2014-05-25 DIAGNOSIS — M7532 Calcific tendinitis of left shoulder: Secondary | ICD-10-CM | POA: Diagnosis not present

## 2014-05-28 DIAGNOSIS — M7532 Calcific tendinitis of left shoulder: Secondary | ICD-10-CM | POA: Diagnosis not present

## 2014-05-28 DIAGNOSIS — M25512 Pain in left shoulder: Secondary | ICD-10-CM | POA: Diagnosis not present

## 2014-06-02 DIAGNOSIS — M7532 Calcific tendinitis of left shoulder: Secondary | ICD-10-CM | POA: Diagnosis not present

## 2014-06-02 DIAGNOSIS — M25512 Pain in left shoulder: Secondary | ICD-10-CM | POA: Diagnosis not present

## 2014-06-04 DIAGNOSIS — M7532 Calcific tendinitis of left shoulder: Secondary | ICD-10-CM | POA: Diagnosis not present

## 2014-06-04 DIAGNOSIS — M25512 Pain in left shoulder: Secondary | ICD-10-CM | POA: Diagnosis not present

## 2014-06-07 DIAGNOSIS — M7532 Calcific tendinitis of left shoulder: Secondary | ICD-10-CM | POA: Diagnosis not present

## 2014-06-07 DIAGNOSIS — M25512 Pain in left shoulder: Secondary | ICD-10-CM | POA: Diagnosis not present

## 2014-06-11 DIAGNOSIS — M25512 Pain in left shoulder: Secondary | ICD-10-CM | POA: Diagnosis not present

## 2014-06-11 DIAGNOSIS — M7532 Calcific tendinitis of left shoulder: Secondary | ICD-10-CM | POA: Diagnosis not present

## 2014-06-14 DIAGNOSIS — M25512 Pain in left shoulder: Secondary | ICD-10-CM | POA: Diagnosis not present

## 2014-06-14 DIAGNOSIS — M7532 Calcific tendinitis of left shoulder: Secondary | ICD-10-CM | POA: Diagnosis not present

## 2014-06-15 DIAGNOSIS — N3090 Cystitis, unspecified without hematuria: Secondary | ICD-10-CM | POA: Diagnosis not present

## 2014-06-17 DIAGNOSIS — M25512 Pain in left shoulder: Secondary | ICD-10-CM | POA: Diagnosis not present

## 2014-06-17 DIAGNOSIS — M7532 Calcific tendinitis of left shoulder: Secondary | ICD-10-CM | POA: Diagnosis not present

## 2014-06-22 DIAGNOSIS — M7532 Calcific tendinitis of left shoulder: Secondary | ICD-10-CM | POA: Diagnosis not present

## 2014-06-22 DIAGNOSIS — M25512 Pain in left shoulder: Secondary | ICD-10-CM | POA: Diagnosis not present

## 2014-06-23 DIAGNOSIS — J111 Influenza due to unidentified influenza virus with other respiratory manifestations: Secondary | ICD-10-CM | POA: Diagnosis not present

## 2014-06-23 DIAGNOSIS — R05 Cough: Secondary | ICD-10-CM | POA: Diagnosis not present

## 2014-06-23 DIAGNOSIS — J1189 Influenza due to unidentified influenza virus with other manifestations: Secondary | ICD-10-CM | POA: Diagnosis not present

## 2014-06-24 DIAGNOSIS — M25512 Pain in left shoulder: Secondary | ICD-10-CM | POA: Diagnosis not present

## 2014-06-24 DIAGNOSIS — M7532 Calcific tendinitis of left shoulder: Secondary | ICD-10-CM | POA: Diagnosis not present

## 2014-06-28 DIAGNOSIS — J208 Acute bronchitis due to other specified organisms: Secondary | ICD-10-CM | POA: Diagnosis not present

## 2014-06-28 DIAGNOSIS — B9689 Other specified bacterial agents as the cause of diseases classified elsewhere: Secondary | ICD-10-CM | POA: Diagnosis not present

## 2014-06-28 DIAGNOSIS — M25512 Pain in left shoulder: Secondary | ICD-10-CM | POA: Diagnosis not present

## 2014-06-28 DIAGNOSIS — M7532 Calcific tendinitis of left shoulder: Secondary | ICD-10-CM | POA: Diagnosis not present

## 2014-06-30 DIAGNOSIS — M7532 Calcific tendinitis of left shoulder: Secondary | ICD-10-CM | POA: Diagnosis not present

## 2014-06-30 DIAGNOSIS — M25512 Pain in left shoulder: Secondary | ICD-10-CM | POA: Diagnosis not present

## 2014-07-05 DIAGNOSIS — M25512 Pain in left shoulder: Secondary | ICD-10-CM | POA: Diagnosis not present

## 2014-07-05 DIAGNOSIS — M7532 Calcific tendinitis of left shoulder: Secondary | ICD-10-CM | POA: Diagnosis not present

## 2014-07-08 DIAGNOSIS — M7532 Calcific tendinitis of left shoulder: Secondary | ICD-10-CM | POA: Diagnosis not present

## 2014-07-08 DIAGNOSIS — M25512 Pain in left shoulder: Secondary | ICD-10-CM | POA: Diagnosis not present

## 2014-07-15 DIAGNOSIS — M25512 Pain in left shoulder: Secondary | ICD-10-CM | POA: Diagnosis not present

## 2014-07-15 DIAGNOSIS — M7532 Calcific tendinitis of left shoulder: Secondary | ICD-10-CM | POA: Diagnosis not present

## 2014-07-19 DIAGNOSIS — H6123 Impacted cerumen, bilateral: Secondary | ICD-10-CM | POA: Diagnosis not present

## 2014-07-19 DIAGNOSIS — J302 Other seasonal allergic rhinitis: Secondary | ICD-10-CM | POA: Diagnosis not present

## 2014-07-21 DIAGNOSIS — H2511 Age-related nuclear cataract, right eye: Secondary | ICD-10-CM | POA: Diagnosis not present

## 2014-07-22 DIAGNOSIS — M25512 Pain in left shoulder: Secondary | ICD-10-CM | POA: Diagnosis not present

## 2014-07-22 DIAGNOSIS — M7532 Calcific tendinitis of left shoulder: Secondary | ICD-10-CM | POA: Diagnosis not present

## 2014-07-27 DIAGNOSIS — E780 Pure hypercholesterolemia: Secondary | ICD-10-CM | POA: Diagnosis not present

## 2014-07-27 DIAGNOSIS — N182 Chronic kidney disease, stage 2 (mild): Secondary | ICD-10-CM | POA: Diagnosis not present

## 2014-07-27 DIAGNOSIS — E782 Mixed hyperlipidemia: Secondary | ICD-10-CM | POA: Diagnosis not present

## 2014-07-27 DIAGNOSIS — F419 Anxiety disorder, unspecified: Secondary | ICD-10-CM | POA: Diagnosis not present

## 2014-07-27 DIAGNOSIS — Z79899 Other long term (current) drug therapy: Secondary | ICD-10-CM | POA: Diagnosis not present

## 2014-07-27 DIAGNOSIS — Z0000 Encounter for general adult medical examination without abnormal findings: Secondary | ICD-10-CM | POA: Diagnosis not present

## 2014-07-27 DIAGNOSIS — I10 Essential (primary) hypertension: Secondary | ICD-10-CM | POA: Diagnosis not present

## 2014-07-29 DIAGNOSIS — M25512 Pain in left shoulder: Secondary | ICD-10-CM | POA: Diagnosis not present

## 2014-07-29 DIAGNOSIS — M7532 Calcific tendinitis of left shoulder: Secondary | ICD-10-CM | POA: Diagnosis not present

## 2014-08-04 DIAGNOSIS — N3090 Cystitis, unspecified without hematuria: Secondary | ICD-10-CM | POA: Diagnosis not present

## 2014-08-04 DIAGNOSIS — N3289 Other specified disorders of bladder: Secondary | ICD-10-CM | POA: Diagnosis not present

## 2014-08-04 DIAGNOSIS — N952 Postmenopausal atrophic vaginitis: Secondary | ICD-10-CM | POA: Diagnosis not present

## 2014-08-06 DIAGNOSIS — J309 Allergic rhinitis, unspecified: Secondary | ICD-10-CM | POA: Diagnosis not present

## 2014-08-06 DIAGNOSIS — H6980 Other specified disorders of Eustachian tube, unspecified ear: Secondary | ICD-10-CM | POA: Diagnosis not present

## 2014-09-08 DIAGNOSIS — S72041D Displaced fracture of base of neck of right femur, subsequent encounter for closed fracture with routine healing: Secondary | ICD-10-CM | POA: Diagnosis not present

## 2014-09-08 DIAGNOSIS — M810 Age-related osteoporosis without current pathological fracture: Secondary | ICD-10-CM | POA: Diagnosis not present

## 2014-09-13 DIAGNOSIS — M8589 Other specified disorders of bone density and structure, multiple sites: Secondary | ICD-10-CM | POA: Diagnosis not present

## 2014-09-13 DIAGNOSIS — M810 Age-related osteoporosis without current pathological fracture: Secondary | ICD-10-CM | POA: Diagnosis not present

## 2014-09-16 DIAGNOSIS — J208 Acute bronchitis due to other specified organisms: Secondary | ICD-10-CM | POA: Diagnosis not present

## 2014-09-16 DIAGNOSIS — B9689 Other specified bacterial agents as the cause of diseases classified elsewhere: Secondary | ICD-10-CM | POA: Diagnosis not present

## 2014-09-19 DIAGNOSIS — M1990 Unspecified osteoarthritis, unspecified site: Secondary | ICD-10-CM | POA: Diagnosis not present

## 2014-09-19 DIAGNOSIS — D539 Nutritional anemia, unspecified: Secondary | ICD-10-CM | POA: Diagnosis not present

## 2014-09-19 DIAGNOSIS — E559 Vitamin D deficiency, unspecified: Secondary | ICD-10-CM | POA: Diagnosis not present

## 2014-09-19 DIAGNOSIS — D649 Anemia, unspecified: Secondary | ICD-10-CM | POA: Diagnosis not present

## 2014-09-26 DIAGNOSIS — N189 Chronic kidney disease, unspecified: Secondary | ICD-10-CM | POA: Diagnosis not present

## 2014-09-26 DIAGNOSIS — E780 Pure hypercholesterolemia: Secondary | ICD-10-CM | POA: Diagnosis not present

## 2014-09-26 DIAGNOSIS — F419 Anxiety disorder, unspecified: Secondary | ICD-10-CM | POA: Diagnosis not present

## 2014-09-26 DIAGNOSIS — I10 Essential (primary) hypertension: Secondary | ICD-10-CM | POA: Diagnosis not present

## 2014-09-30 DIAGNOSIS — M7552 Bursitis of left shoulder: Secondary | ICD-10-CM | POA: Diagnosis not present

## 2014-09-30 DIAGNOSIS — Z9889 Other specified postprocedural states: Secondary | ICD-10-CM | POA: Diagnosis not present

## 2014-10-18 DIAGNOSIS — M75111 Incomplete rotator cuff tear or rupture of right shoulder, not specified as traumatic: Secondary | ICD-10-CM | POA: Diagnosis not present

## 2014-10-18 DIAGNOSIS — M7551 Bursitis of right shoulder: Secondary | ICD-10-CM | POA: Diagnosis not present

## 2014-10-26 DIAGNOSIS — F419 Anxiety disorder, unspecified: Secondary | ICD-10-CM | POA: Diagnosis not present

## 2014-10-26 DIAGNOSIS — N189 Chronic kidney disease, unspecified: Secondary | ICD-10-CM | POA: Diagnosis not present

## 2014-10-26 DIAGNOSIS — E780 Pure hypercholesterolemia: Secondary | ICD-10-CM | POA: Diagnosis not present

## 2014-10-26 DIAGNOSIS — I10 Essential (primary) hypertension: Secondary | ICD-10-CM | POA: Diagnosis not present

## 2014-11-11 DIAGNOSIS — L570 Actinic keratosis: Secondary | ICD-10-CM | POA: Diagnosis not present

## 2014-11-11 DIAGNOSIS — R233 Spontaneous ecchymoses: Secondary | ICD-10-CM | POA: Diagnosis not present

## 2014-11-23 DIAGNOSIS — N952 Postmenopausal atrophic vaginitis: Secondary | ICD-10-CM | POA: Diagnosis not present

## 2014-11-23 DIAGNOSIS — N390 Urinary tract infection, site not specified: Secondary | ICD-10-CM | POA: Diagnosis not present

## 2014-11-23 DIAGNOSIS — N3090 Cystitis, unspecified without hematuria: Secondary | ICD-10-CM | POA: Diagnosis not present

## 2014-11-23 DIAGNOSIS — R339 Retention of urine, unspecified: Secondary | ICD-10-CM | POA: Diagnosis not present

## 2014-11-26 DIAGNOSIS — E569 Vitamin deficiency, unspecified: Secondary | ICD-10-CM | POA: Diagnosis not present

## 2014-11-26 DIAGNOSIS — F419 Anxiety disorder, unspecified: Secondary | ICD-10-CM | POA: Diagnosis not present

## 2014-11-26 DIAGNOSIS — N189 Chronic kidney disease, unspecified: Secondary | ICD-10-CM | POA: Diagnosis not present

## 2014-11-26 DIAGNOSIS — E782 Mixed hyperlipidemia: Secondary | ICD-10-CM | POA: Diagnosis not present

## 2014-11-29 DIAGNOSIS — M7551 Bursitis of right shoulder: Secondary | ICD-10-CM | POA: Diagnosis not present

## 2014-11-29 DIAGNOSIS — M75111 Incomplete rotator cuff tear or rupture of right shoulder, not specified as traumatic: Secondary | ICD-10-CM | POA: Diagnosis not present

## 2014-12-06 DIAGNOSIS — H04123 Dry eye syndrome of bilateral lacrimal glands: Secondary | ICD-10-CM | POA: Diagnosis not present

## 2014-12-09 DIAGNOSIS — S72041D Displaced fracture of base of neck of right femur, subsequent encounter for closed fracture with routine healing: Secondary | ICD-10-CM | POA: Diagnosis not present

## 2014-12-24 DIAGNOSIS — M1611 Unilateral primary osteoarthritis, right hip: Secondary | ICD-10-CM | POA: Diagnosis not present

## 2014-12-24 DIAGNOSIS — M25551 Pain in right hip: Secondary | ICD-10-CM | POA: Diagnosis not present

## 2014-12-28 DIAGNOSIS — H04123 Dry eye syndrome of bilateral lacrimal glands: Secondary | ICD-10-CM | POA: Diagnosis not present

## 2015-01-07 DIAGNOSIS — S72041S Displaced fracture of base of neck of right femur, sequela: Secondary | ICD-10-CM | POA: Diagnosis not present

## 2015-01-18 DIAGNOSIS — B9689 Other specified bacterial agents as the cause of diseases classified elsewhere: Secondary | ICD-10-CM | POA: Diagnosis not present

## 2015-01-18 DIAGNOSIS — J019 Acute sinusitis, unspecified: Secondary | ICD-10-CM | POA: Diagnosis not present

## 2015-01-31 DIAGNOSIS — M7551 Bursitis of right shoulder: Secondary | ICD-10-CM | POA: Diagnosis not present

## 2015-02-04 DIAGNOSIS — M75111 Incomplete rotator cuff tear or rupture of right shoulder, not specified as traumatic: Secondary | ICD-10-CM | POA: Diagnosis not present

## 2015-02-04 DIAGNOSIS — M25511 Pain in right shoulder: Secondary | ICD-10-CM | POA: Diagnosis not present

## 2015-02-04 DIAGNOSIS — M19011 Primary osteoarthritis, right shoulder: Secondary | ICD-10-CM | POA: Diagnosis not present

## 2015-02-09 DIAGNOSIS — M7551 Bursitis of right shoulder: Secondary | ICD-10-CM | POA: Diagnosis not present

## 2015-02-09 DIAGNOSIS — M75111 Incomplete rotator cuff tear or rupture of right shoulder, not specified as traumatic: Secondary | ICD-10-CM | POA: Diagnosis not present

## 2015-02-15 DIAGNOSIS — M659 Synovitis and tenosynovitis, unspecified: Secondary | ICD-10-CM | POA: Diagnosis not present

## 2015-02-22 DIAGNOSIS — Z23 Encounter for immunization: Secondary | ICD-10-CM | POA: Diagnosis not present

## 2015-02-26 DIAGNOSIS — G4733 Obstructive sleep apnea (adult) (pediatric): Secondary | ICD-10-CM | POA: Diagnosis not present

## 2015-02-26 DIAGNOSIS — K219 Gastro-esophageal reflux disease without esophagitis: Secondary | ICD-10-CM | POA: Diagnosis not present

## 2015-02-26 DIAGNOSIS — D649 Anemia, unspecified: Secondary | ICD-10-CM | POA: Diagnosis not present

## 2015-02-26 DIAGNOSIS — R5382 Chronic fatigue, unspecified: Secondary | ICD-10-CM | POA: Diagnosis not present

## 2015-03-23 DIAGNOSIS — M25512 Pain in left shoulder: Secondary | ICD-10-CM | POA: Diagnosis not present

## 2015-03-28 DIAGNOSIS — N952 Postmenopausal atrophic vaginitis: Secondary | ICD-10-CM | POA: Diagnosis not present

## 2015-03-28 DIAGNOSIS — N3090 Cystitis, unspecified without hematuria: Secondary | ICD-10-CM | POA: Diagnosis not present

## 2015-04-08 DIAGNOSIS — S72041D Displaced fracture of base of neck of right femur, subsequent encounter for closed fracture with routine healing: Secondary | ICD-10-CM | POA: Diagnosis not present

## 2015-04-12 DIAGNOSIS — B9689 Other specified bacterial agents as the cause of diseases classified elsewhere: Secondary | ICD-10-CM | POA: Diagnosis not present

## 2015-04-12 DIAGNOSIS — J208 Acute bronchitis due to other specified organisms: Secondary | ICD-10-CM | POA: Diagnosis not present

## 2015-04-24 HISTORY — PX: CATARACT EXTRACTION: SUR2

## 2015-05-12 DIAGNOSIS — Z01419 Encounter for gynecological examination (general) (routine) without abnormal findings: Secondary | ICD-10-CM | POA: Diagnosis not present

## 2015-05-17 DIAGNOSIS — Z6827 Body mass index (BMI) 27.0-27.9, adult: Secondary | ICD-10-CM | POA: Diagnosis not present

## 2015-05-17 DIAGNOSIS — I10 Essential (primary) hypertension: Secondary | ICD-10-CM | POA: Diagnosis not present

## 2015-05-18 DIAGNOSIS — I10 Essential (primary) hypertension: Secondary | ICD-10-CM | POA: Diagnosis not present

## 2015-05-24 DIAGNOSIS — E7800 Pure hypercholesterolemia, unspecified: Secondary | ICD-10-CM | POA: Diagnosis not present

## 2015-05-24 DIAGNOSIS — N183 Chronic kidney disease, stage 3 (moderate): Secondary | ICD-10-CM | POA: Diagnosis not present

## 2015-05-24 DIAGNOSIS — N3090 Cystitis, unspecified without hematuria: Secondary | ICD-10-CM | POA: Diagnosis not present

## 2015-05-24 DIAGNOSIS — I10 Essential (primary) hypertension: Secondary | ICD-10-CM | POA: Diagnosis not present

## 2015-05-26 DIAGNOSIS — Z1231 Encounter for screening mammogram for malignant neoplasm of breast: Secondary | ICD-10-CM | POA: Diagnosis not present

## 2015-07-19 DIAGNOSIS — E7800 Pure hypercholesterolemia, unspecified: Secondary | ICD-10-CM | POA: Diagnosis not present

## 2015-07-19 DIAGNOSIS — N183 Chronic kidney disease, stage 3 (moderate): Secondary | ICD-10-CM | POA: Diagnosis not present

## 2015-07-19 DIAGNOSIS — I10 Essential (primary) hypertension: Secondary | ICD-10-CM | POA: Diagnosis not present

## 2015-07-19 DIAGNOSIS — D487 Neoplasm of uncertain behavior of other specified sites: Secondary | ICD-10-CM | POA: Diagnosis not present

## 2015-07-25 DIAGNOSIS — H26492 Other secondary cataract, left eye: Secondary | ICD-10-CM | POA: Diagnosis not present

## 2015-07-25 DIAGNOSIS — H2511 Age-related nuclear cataract, right eye: Secondary | ICD-10-CM | POA: Diagnosis not present

## 2015-07-29 DIAGNOSIS — N3090 Cystitis, unspecified without hematuria: Secondary | ICD-10-CM | POA: Diagnosis not present

## 2015-07-29 DIAGNOSIS — R339 Retention of urine, unspecified: Secondary | ICD-10-CM | POA: Diagnosis not present

## 2015-08-09 DIAGNOSIS — K5909 Other constipation: Secondary | ICD-10-CM | POA: Diagnosis not present

## 2015-08-09 DIAGNOSIS — R1084 Generalized abdominal pain: Secondary | ICD-10-CM | POA: Diagnosis not present

## 2015-08-19 DIAGNOSIS — H25811 Combined forms of age-related cataract, right eye: Secondary | ICD-10-CM | POA: Diagnosis not present

## 2015-09-07 DIAGNOSIS — C44529 Squamous cell carcinoma of skin of other part of trunk: Secondary | ICD-10-CM | POA: Diagnosis not present

## 2015-09-08 DIAGNOSIS — J209 Acute bronchitis, unspecified: Secondary | ICD-10-CM | POA: Diagnosis not present

## 2015-09-13 DIAGNOSIS — J0120 Acute ethmoidal sinusitis, unspecified: Secondary | ICD-10-CM | POA: Diagnosis not present

## 2015-09-22 DIAGNOSIS — C44519 Basal cell carcinoma of skin of other part of trunk: Secondary | ICD-10-CM | POA: Diagnosis not present

## 2015-10-04 DIAGNOSIS — Z79899 Other long term (current) drug therapy: Secondary | ICD-10-CM | POA: Diagnosis not present

## 2015-10-04 DIAGNOSIS — I10 Essential (primary) hypertension: Secondary | ICD-10-CM | POA: Diagnosis not present

## 2015-10-04 DIAGNOSIS — Z7982 Long term (current) use of aspirin: Secondary | ICD-10-CM | POA: Diagnosis not present

## 2015-10-04 DIAGNOSIS — H25811 Combined forms of age-related cataract, right eye: Secondary | ICD-10-CM | POA: Diagnosis not present

## 2015-10-04 DIAGNOSIS — Z8709 Personal history of other diseases of the respiratory system: Secondary | ICD-10-CM | POA: Diagnosis not present

## 2015-10-04 DIAGNOSIS — E785 Hyperlipidemia, unspecified: Secondary | ICD-10-CM | POA: Diagnosis not present

## 2015-10-04 DIAGNOSIS — K219 Gastro-esophageal reflux disease without esophagitis: Secondary | ICD-10-CM | POA: Diagnosis not present

## 2015-10-26 DIAGNOSIS — N183 Chronic kidney disease, stage 3 (moderate): Secondary | ICD-10-CM | POA: Diagnosis not present

## 2015-10-26 DIAGNOSIS — E663 Overweight: Secondary | ICD-10-CM | POA: Diagnosis not present

## 2015-10-26 DIAGNOSIS — Z0000 Encounter for general adult medical examination without abnormal findings: Secondary | ICD-10-CM | POA: Diagnosis not present

## 2015-10-26 DIAGNOSIS — E785 Hyperlipidemia, unspecified: Secondary | ICD-10-CM | POA: Diagnosis not present

## 2015-10-26 DIAGNOSIS — I10 Essential (primary) hypertension: Secondary | ICD-10-CM | POA: Diagnosis not present

## 2015-11-01 DIAGNOSIS — H524 Presbyopia: Secondary | ICD-10-CM | POA: Diagnosis not present

## 2015-11-15 DIAGNOSIS — L570 Actinic keratosis: Secondary | ICD-10-CM | POA: Diagnosis not present

## 2015-11-16 NOTE — Progress Notes (Signed)
Error

## 2015-11-21 DIAGNOSIS — Z79899 Other long term (current) drug therapy: Secondary | ICD-10-CM | POA: Diagnosis not present

## 2015-11-21 DIAGNOSIS — S81801A Unspecified open wound, right lower leg, initial encounter: Secondary | ICD-10-CM | POA: Diagnosis not present

## 2015-11-21 DIAGNOSIS — R739 Hyperglycemia, unspecified: Secondary | ICD-10-CM | POA: Diagnosis not present

## 2015-11-21 DIAGNOSIS — E785 Hyperlipidemia, unspecified: Secondary | ICD-10-CM | POA: Diagnosis not present

## 2015-11-23 DIAGNOSIS — E782 Mixed hyperlipidemia: Secondary | ICD-10-CM | POA: Diagnosis not present

## 2015-11-23 DIAGNOSIS — Z6825 Body mass index (BMI) 25.0-25.9, adult: Secondary | ICD-10-CM | POA: Diagnosis not present

## 2015-11-23 DIAGNOSIS — R002 Palpitations: Secondary | ICD-10-CM | POA: Diagnosis not present

## 2015-12-01 DIAGNOSIS — L089 Local infection of the skin and subcutaneous tissue, unspecified: Secondary | ICD-10-CM | POA: Diagnosis not present

## 2015-12-01 DIAGNOSIS — T63481A Toxic effect of venom of other arthropod, accidental (unintentional), initial encounter: Secondary | ICD-10-CM | POA: Diagnosis not present

## 2015-12-01 DIAGNOSIS — S81801D Unspecified open wound, right lower leg, subsequent encounter: Secondary | ICD-10-CM | POA: Diagnosis not present

## 2015-12-06 DIAGNOSIS — N3289 Other specified disorders of bladder: Secondary | ICD-10-CM | POA: Diagnosis not present

## 2015-12-06 DIAGNOSIS — N952 Postmenopausal atrophic vaginitis: Secondary | ICD-10-CM | POA: Diagnosis not present

## 2015-12-06 DIAGNOSIS — N8111 Cystocele, midline: Secondary | ICD-10-CM | POA: Diagnosis not present

## 2015-12-12 DIAGNOSIS — S81801D Unspecified open wound, right lower leg, subsequent encounter: Secondary | ICD-10-CM | POA: Diagnosis not present

## 2015-12-12 DIAGNOSIS — M1990 Unspecified osteoarthritis, unspecified site: Secondary | ICD-10-CM | POA: Diagnosis not present

## 2015-12-12 DIAGNOSIS — I872 Venous insufficiency (chronic) (peripheral): Secondary | ICD-10-CM | POA: Diagnosis not present

## 2015-12-12 DIAGNOSIS — L97214 Non-pressure chronic ulcer of right calf with necrosis of bone: Secondary | ICD-10-CM | POA: Diagnosis not present

## 2015-12-12 DIAGNOSIS — I10 Essential (primary) hypertension: Secondary | ICD-10-CM | POA: Diagnosis not present

## 2015-12-12 DIAGNOSIS — L97811 Non-pressure chronic ulcer of other part of right lower leg limited to breakdown of skin: Secondary | ICD-10-CM | POA: Diagnosis not present

## 2015-12-15 DIAGNOSIS — M79604 Pain in right leg: Secondary | ICD-10-CM | POA: Diagnosis not present

## 2015-12-15 DIAGNOSIS — I872 Venous insufficiency (chronic) (peripheral): Secondary | ICD-10-CM | POA: Diagnosis not present

## 2015-12-19 DIAGNOSIS — I872 Venous insufficiency (chronic) (peripheral): Secondary | ICD-10-CM | POA: Diagnosis not present

## 2015-12-19 DIAGNOSIS — L97812 Non-pressure chronic ulcer of other part of right lower leg with fat layer exposed: Secondary | ICD-10-CM | POA: Diagnosis not present

## 2015-12-19 DIAGNOSIS — I87311 Chronic venous hypertension (idiopathic) with ulcer of right lower extremity: Secondary | ICD-10-CM | POA: Diagnosis not present

## 2015-12-19 DIAGNOSIS — L97811 Non-pressure chronic ulcer of other part of right lower leg limited to breakdown of skin: Secondary | ICD-10-CM | POA: Diagnosis not present

## 2015-12-27 DIAGNOSIS — I872 Venous insufficiency (chronic) (peripheral): Secondary | ICD-10-CM | POA: Diagnosis not present

## 2015-12-27 DIAGNOSIS — L97811 Non-pressure chronic ulcer of other part of right lower leg limited to breakdown of skin: Secondary | ICD-10-CM | POA: Diagnosis not present

## 2015-12-27 DIAGNOSIS — L97812 Non-pressure chronic ulcer of other part of right lower leg with fat layer exposed: Secondary | ICD-10-CM | POA: Diagnosis not present

## 2015-12-27 DIAGNOSIS — I87311 Chronic venous hypertension (idiopathic) with ulcer of right lower extremity: Secondary | ICD-10-CM | POA: Diagnosis not present

## 2016-01-03 DIAGNOSIS — I872 Venous insufficiency (chronic) (peripheral): Secondary | ICD-10-CM | POA: Diagnosis not present

## 2016-01-03 DIAGNOSIS — L97812 Non-pressure chronic ulcer of other part of right lower leg with fat layer exposed: Secondary | ICD-10-CM | POA: Diagnosis not present

## 2016-01-09 DIAGNOSIS — M7550 Bursitis of unspecified shoulder: Secondary | ICD-10-CM | POA: Diagnosis not present

## 2016-01-09 DIAGNOSIS — M25511 Pain in right shoulder: Secondary | ICD-10-CM | POA: Diagnosis not present

## 2016-01-10 DIAGNOSIS — I872 Venous insufficiency (chronic) (peripheral): Secondary | ICD-10-CM | POA: Diagnosis not present

## 2016-01-10 DIAGNOSIS — L97812 Non-pressure chronic ulcer of other part of right lower leg with fat layer exposed: Secondary | ICD-10-CM | POA: Diagnosis not present

## 2016-01-10 DIAGNOSIS — L97214 Non-pressure chronic ulcer of right calf with necrosis of bone: Secondary | ICD-10-CM | POA: Diagnosis not present

## 2016-01-12 DIAGNOSIS — I872 Venous insufficiency (chronic) (peripheral): Secondary | ICD-10-CM | POA: Diagnosis not present

## 2016-01-12 DIAGNOSIS — I824Z1 Acute embolism and thrombosis of unspecified deep veins of right distal lower extremity: Secondary | ICD-10-CM | POA: Diagnosis not present

## 2016-01-12 DIAGNOSIS — L97313 Non-pressure chronic ulcer of right ankle with necrosis of muscle: Secondary | ICD-10-CM | POA: Diagnosis not present

## 2016-01-12 DIAGNOSIS — I82401 Acute embolism and thrombosis of unspecified deep veins of right lower extremity: Secondary | ICD-10-CM | POA: Diagnosis not present

## 2016-01-17 DIAGNOSIS — L97812 Non-pressure chronic ulcer of other part of right lower leg with fat layer exposed: Secondary | ICD-10-CM | POA: Diagnosis not present

## 2016-01-17 DIAGNOSIS — I87311 Chronic venous hypertension (idiopathic) with ulcer of right lower extremity: Secondary | ICD-10-CM | POA: Diagnosis not present

## 2016-01-17 DIAGNOSIS — I872 Venous insufficiency (chronic) (peripheral): Secondary | ICD-10-CM | POA: Diagnosis not present

## 2016-01-23 DIAGNOSIS — I872 Venous insufficiency (chronic) (peripheral): Secondary | ICD-10-CM | POA: Diagnosis not present

## 2016-01-23 DIAGNOSIS — I80201 Phlebitis and thrombophlebitis of unspecified deep vessels of right lower extremity: Secondary | ICD-10-CM | POA: Diagnosis not present

## 2016-01-24 DIAGNOSIS — I87311 Chronic venous hypertension (idiopathic) with ulcer of right lower extremity: Secondary | ICD-10-CM | POA: Diagnosis not present

## 2016-01-24 DIAGNOSIS — L97811 Non-pressure chronic ulcer of other part of right lower leg limited to breakdown of skin: Secondary | ICD-10-CM | POA: Diagnosis not present

## 2016-01-24 DIAGNOSIS — I872 Venous insufficiency (chronic) (peripheral): Secondary | ICD-10-CM | POA: Diagnosis not present

## 2016-01-24 DIAGNOSIS — Z872 Personal history of diseases of the skin and subcutaneous tissue: Secondary | ICD-10-CM | POA: Diagnosis not present

## 2016-01-26 DIAGNOSIS — Z23 Encounter for immunization: Secondary | ICD-10-CM | POA: Diagnosis not present

## 2016-01-26 DIAGNOSIS — E785 Hyperlipidemia, unspecified: Secondary | ICD-10-CM | POA: Diagnosis not present

## 2016-01-26 DIAGNOSIS — S81801D Unspecified open wound, right lower leg, subsequent encounter: Secondary | ICD-10-CM | POA: Diagnosis not present

## 2016-01-26 DIAGNOSIS — I10 Essential (primary) hypertension: Secondary | ICD-10-CM | POA: Diagnosis not present

## 2016-01-26 DIAGNOSIS — Z79899 Other long term (current) drug therapy: Secondary | ICD-10-CM | POA: Diagnosis not present

## 2016-01-26 DIAGNOSIS — N183 Chronic kidney disease, stage 3 (moderate): Secondary | ICD-10-CM | POA: Diagnosis not present

## 2016-02-27 DIAGNOSIS — N183 Chronic kidney disease, stage 3 (moderate): Secondary | ICD-10-CM | POA: Diagnosis not present

## 2016-03-16 DIAGNOSIS — N3090 Cystitis, unspecified without hematuria: Secondary | ICD-10-CM | POA: Diagnosis not present

## 2016-03-16 DIAGNOSIS — N8111 Cystocele, midline: Secondary | ICD-10-CM | POA: Diagnosis not present

## 2016-03-16 DIAGNOSIS — N952 Postmenopausal atrophic vaginitis: Secondary | ICD-10-CM | POA: Diagnosis not present

## 2016-03-16 DIAGNOSIS — N390 Urinary tract infection, site not specified: Secondary | ICD-10-CM | POA: Diagnosis not present

## 2016-04-11 DIAGNOSIS — M25511 Pain in right shoulder: Secondary | ICD-10-CM | POA: Diagnosis not present

## 2016-05-02 DIAGNOSIS — N390 Urinary tract infection, site not specified: Secondary | ICD-10-CM | POA: Diagnosis not present

## 2016-05-17 DIAGNOSIS — L821 Other seborrheic keratosis: Secondary | ICD-10-CM | POA: Diagnosis not present

## 2016-05-17 DIAGNOSIS — L728 Other follicular cysts of the skin and subcutaneous tissue: Secondary | ICD-10-CM | POA: Diagnosis not present

## 2016-05-17 DIAGNOSIS — L570 Actinic keratosis: Secondary | ICD-10-CM | POA: Diagnosis not present

## 2016-05-18 DIAGNOSIS — H26493 Other secondary cataract, bilateral: Secondary | ICD-10-CM | POA: Diagnosis not present

## 2016-05-29 DIAGNOSIS — E782 Mixed hyperlipidemia: Secondary | ICD-10-CM | POA: Diagnosis not present

## 2016-05-29 DIAGNOSIS — I739 Peripheral vascular disease, unspecified: Secondary | ICD-10-CM | POA: Diagnosis not present

## 2016-05-29 DIAGNOSIS — Z6828 Body mass index (BMI) 28.0-28.9, adult: Secondary | ICD-10-CM | POA: Diagnosis not present

## 2016-05-29 DIAGNOSIS — R002 Palpitations: Secondary | ICD-10-CM | POA: Diagnosis not present

## 2016-06-08 DIAGNOSIS — I82401 Acute embolism and thrombosis of unspecified deep veins of right lower extremity: Secondary | ICD-10-CM | POA: Diagnosis not present

## 2016-06-08 DIAGNOSIS — I872 Venous insufficiency (chronic) (peripheral): Secondary | ICD-10-CM | POA: Diagnosis not present

## 2016-06-11 DIAGNOSIS — N390 Urinary tract infection, site not specified: Secondary | ICD-10-CM | POA: Diagnosis not present

## 2016-06-11 DIAGNOSIS — I10 Essential (primary) hypertension: Secondary | ICD-10-CM | POA: Diagnosis not present

## 2016-06-11 DIAGNOSIS — W57XXXA Bitten or stung by nonvenomous insect and other nonvenomous arthropods, initial encounter: Secondary | ICD-10-CM | POA: Diagnosis not present

## 2016-06-11 DIAGNOSIS — L03115 Cellulitis of right lower limb: Secondary | ICD-10-CM | POA: Diagnosis not present

## 2016-06-21 DIAGNOSIS — Z86718 Personal history of other venous thrombosis and embolism: Secondary | ICD-10-CM | POA: Diagnosis not present

## 2016-06-21 DIAGNOSIS — I872 Venous insufficiency (chronic) (peripheral): Secondary | ICD-10-CM | POA: Diagnosis not present

## 2016-07-03 DIAGNOSIS — N8111 Cystocele, midline: Secondary | ICD-10-CM | POA: Diagnosis not present

## 2016-07-03 DIAGNOSIS — N3090 Cystitis, unspecified without hematuria: Secondary | ICD-10-CM | POA: Diagnosis not present

## 2016-07-06 DIAGNOSIS — Z79899 Other long term (current) drug therapy: Secondary | ICD-10-CM | POA: Diagnosis not present

## 2016-07-10 DIAGNOSIS — I83891 Varicose veins of right lower extremities with other complications: Secondary | ICD-10-CM | POA: Diagnosis not present

## 2016-07-10 DIAGNOSIS — I83813 Varicose veins of bilateral lower extremities with pain: Secondary | ICD-10-CM | POA: Diagnosis not present

## 2016-07-12 DIAGNOSIS — M7551 Bursitis of right shoulder: Secondary | ICD-10-CM | POA: Diagnosis not present

## 2016-07-18 DIAGNOSIS — I83813 Varicose veins of bilateral lower extremities with pain: Secondary | ICD-10-CM | POA: Diagnosis not present

## 2016-07-18 DIAGNOSIS — I872 Venous insufficiency (chronic) (peripheral): Secondary | ICD-10-CM | POA: Diagnosis not present

## 2016-07-18 DIAGNOSIS — I7090 Unspecified atherosclerosis: Secondary | ICD-10-CM | POA: Diagnosis not present

## 2016-07-19 DIAGNOSIS — E785 Hyperlipidemia, unspecified: Secondary | ICD-10-CM | POA: Diagnosis not present

## 2016-07-24 DIAGNOSIS — Z1231 Encounter for screening mammogram for malignant neoplasm of breast: Secondary | ICD-10-CM | POA: Diagnosis not present

## 2016-08-10 DIAGNOSIS — I872 Venous insufficiency (chronic) (peripheral): Secondary | ICD-10-CM | POA: Diagnosis not present

## 2016-08-10 DIAGNOSIS — I8390 Asymptomatic varicose veins of unspecified lower extremity: Secondary | ICD-10-CM | POA: Diagnosis not present

## 2016-08-10 DIAGNOSIS — I83813 Varicose veins of bilateral lower extremities with pain: Secondary | ICD-10-CM | POA: Diagnosis not present

## 2016-08-20 DIAGNOSIS — E785 Hyperlipidemia, unspecified: Secondary | ICD-10-CM | POA: Diagnosis not present

## 2016-08-29 DIAGNOSIS — E785 Hyperlipidemia, unspecified: Secondary | ICD-10-CM | POA: Diagnosis not present

## 2016-08-29 DIAGNOSIS — Z86718 Personal history of other venous thrombosis and embolism: Secondary | ICD-10-CM | POA: Diagnosis not present

## 2016-08-29 DIAGNOSIS — I10 Essential (primary) hypertension: Secondary | ICD-10-CM | POA: Diagnosis not present

## 2016-08-29 DIAGNOSIS — N183 Chronic kidney disease, stage 3 (moderate): Secondary | ICD-10-CM | POA: Diagnosis not present

## 2016-09-10 DIAGNOSIS — R600 Localized edema: Secondary | ICD-10-CM | POA: Diagnosis not present

## 2016-09-10 DIAGNOSIS — I872 Venous insufficiency (chronic) (peripheral): Secondary | ICD-10-CM | POA: Diagnosis not present

## 2016-09-10 DIAGNOSIS — I83892 Varicose veins of left lower extremities with other complications: Secondary | ICD-10-CM | POA: Diagnosis not present

## 2016-09-10 DIAGNOSIS — I83813 Varicose veins of bilateral lower extremities with pain: Secondary | ICD-10-CM | POA: Diagnosis not present

## 2016-09-18 DIAGNOSIS — Z6826 Body mass index (BMI) 26.0-26.9, adult: Secondary | ICD-10-CM | POA: Diagnosis not present

## 2016-09-18 DIAGNOSIS — H6691 Otitis media, unspecified, right ear: Secondary | ICD-10-CM | POA: Diagnosis not present

## 2016-09-18 DIAGNOSIS — H6091 Unspecified otitis externa, right ear: Secondary | ICD-10-CM | POA: Diagnosis not present

## 2016-09-25 DIAGNOSIS — I872 Venous insufficiency (chronic) (peripheral): Secondary | ICD-10-CM | POA: Diagnosis not present

## 2016-09-25 DIAGNOSIS — I83813 Varicose veins of bilateral lower extremities with pain: Secondary | ICD-10-CM | POA: Diagnosis not present

## 2016-09-25 DIAGNOSIS — I82812 Embolism and thrombosis of superficial veins of left lower extremities: Secondary | ICD-10-CM | POA: Diagnosis not present

## 2016-09-27 DIAGNOSIS — Z6827 Body mass index (BMI) 27.0-27.9, adult: Secondary | ICD-10-CM | POA: Diagnosis not present

## 2016-09-27 DIAGNOSIS — J988 Other specified respiratory disorders: Secondary | ICD-10-CM | POA: Diagnosis not present

## 2016-10-01 DIAGNOSIS — N390 Urinary tract infection, site not specified: Secondary | ICD-10-CM | POA: Diagnosis not present

## 2016-10-02 DIAGNOSIS — R21 Rash and other nonspecific skin eruption: Secondary | ICD-10-CM | POA: Diagnosis not present

## 2016-10-02 DIAGNOSIS — Z6827 Body mass index (BMI) 27.0-27.9, adult: Secondary | ICD-10-CM | POA: Diagnosis not present

## 2016-10-02 DIAGNOSIS — W57XXXA Bitten or stung by nonvenomous insect and other nonvenomous arthropods, initial encounter: Secondary | ICD-10-CM | POA: Diagnosis not present

## 2016-10-09 DIAGNOSIS — I83813 Varicose veins of bilateral lower extremities with pain: Secondary | ICD-10-CM | POA: Diagnosis not present

## 2016-10-09 DIAGNOSIS — I872 Venous insufficiency (chronic) (peripheral): Secondary | ICD-10-CM | POA: Diagnosis not present

## 2016-10-09 DIAGNOSIS — I82812 Embolism and thrombosis of superficial veins of left lower extremities: Secondary | ICD-10-CM | POA: Diagnosis not present

## 2016-10-10 DIAGNOSIS — N3090 Cystitis, unspecified without hematuria: Secondary | ICD-10-CM | POA: Diagnosis not present

## 2016-10-10 DIAGNOSIS — N952 Postmenopausal atrophic vaginitis: Secondary | ICD-10-CM | POA: Diagnosis not present

## 2016-10-10 DIAGNOSIS — N8111 Cystocele, midline: Secondary | ICD-10-CM | POA: Diagnosis not present

## 2016-10-15 DIAGNOSIS — M7551 Bursitis of right shoulder: Secondary | ICD-10-CM | POA: Diagnosis not present

## 2016-10-16 DIAGNOSIS — M1990 Unspecified osteoarthritis, unspecified site: Secondary | ICD-10-CM | POA: Diagnosis not present

## 2016-10-16 DIAGNOSIS — I83813 Varicose veins of bilateral lower extremities with pain: Secondary | ICD-10-CM | POA: Diagnosis not present

## 2016-10-16 DIAGNOSIS — I83892 Varicose veins of left lower extremities with other complications: Secondary | ICD-10-CM | POA: Diagnosis not present

## 2016-10-16 DIAGNOSIS — E7800 Pure hypercholesterolemia, unspecified: Secondary | ICD-10-CM | POA: Diagnosis not present

## 2016-10-16 DIAGNOSIS — I10 Essential (primary) hypertension: Secondary | ICD-10-CM | POA: Diagnosis not present

## 2016-10-16 DIAGNOSIS — I872 Venous insufficiency (chronic) (peripheral): Secondary | ICD-10-CM | POA: Diagnosis not present

## 2016-10-16 DIAGNOSIS — I868 Varicose veins of other specified sites: Secondary | ICD-10-CM | POA: Diagnosis not present

## 2016-11-14 ENCOUNTER — Ambulatory Visit: Payer: Medicare Other | Admitting: Cardiology

## 2016-11-14 DIAGNOSIS — Z6829 Body mass index (BMI) 29.0-29.9, adult: Secondary | ICD-10-CM | POA: Diagnosis not present

## 2016-11-14 DIAGNOSIS — R002 Palpitations: Secondary | ICD-10-CM | POA: Diagnosis not present

## 2016-11-14 DIAGNOSIS — E782 Mixed hyperlipidemia: Secondary | ICD-10-CM | POA: Diagnosis not present

## 2016-11-14 DIAGNOSIS — I739 Peripheral vascular disease, unspecified: Secondary | ICD-10-CM | POA: Diagnosis not present

## 2016-11-15 DIAGNOSIS — L7632 Postprocedural hematoma of skin and subcutaneous tissue following other procedure: Secondary | ICD-10-CM | POA: Diagnosis not present

## 2016-11-15 DIAGNOSIS — I83813 Varicose veins of bilateral lower extremities with pain: Secondary | ICD-10-CM | POA: Diagnosis not present

## 2016-11-26 DIAGNOSIS — M542 Cervicalgia: Secondary | ICD-10-CM | POA: Diagnosis not present

## 2016-11-26 DIAGNOSIS — Z6827 Body mass index (BMI) 27.0-27.9, adult: Secondary | ICD-10-CM | POA: Diagnosis not present

## 2016-12-12 DIAGNOSIS — M7061 Trochanteric bursitis, right hip: Secondary | ICD-10-CM | POA: Diagnosis not present

## 2016-12-19 DIAGNOSIS — J40 Bronchitis, not specified as acute or chronic: Secondary | ICD-10-CM | POA: Diagnosis not present

## 2016-12-19 DIAGNOSIS — Z6827 Body mass index (BMI) 27.0-27.9, adult: Secondary | ICD-10-CM | POA: Diagnosis not present

## 2016-12-19 DIAGNOSIS — J329 Chronic sinusitis, unspecified: Secondary | ICD-10-CM | POA: Diagnosis not present

## 2016-12-25 DIAGNOSIS — J40 Bronchitis, not specified as acute or chronic: Secondary | ICD-10-CM | POA: Diagnosis not present

## 2016-12-25 DIAGNOSIS — Z6827 Body mass index (BMI) 27.0-27.9, adult: Secondary | ICD-10-CM | POA: Diagnosis not present

## 2016-12-25 DIAGNOSIS — J329 Chronic sinusitis, unspecified: Secondary | ICD-10-CM | POA: Diagnosis not present

## 2016-12-26 DIAGNOSIS — M25511 Pain in right shoulder: Secondary | ICD-10-CM | POA: Diagnosis not present

## 2016-12-26 DIAGNOSIS — R002 Palpitations: Secondary | ICD-10-CM

## 2016-12-26 DIAGNOSIS — E785 Hyperlipidemia, unspecified: Secondary | ICD-10-CM

## 2016-12-26 DIAGNOSIS — J069 Acute upper respiratory infection, unspecified: Secondary | ICD-10-CM | POA: Insufficient documentation

## 2016-12-26 DIAGNOSIS — I739 Peripheral vascular disease, unspecified: Secondary | ICD-10-CM

## 2016-12-26 DIAGNOSIS — M75111 Incomplete rotator cuff tear or rupture of right shoulder, not specified as traumatic: Secondary | ICD-10-CM | POA: Diagnosis not present

## 2016-12-26 HISTORY — DX: Palpitations: R00.2

## 2016-12-26 HISTORY — DX: Hyperlipidemia, unspecified: E78.5

## 2016-12-26 HISTORY — DX: Peripheral vascular disease, unspecified: I73.9

## 2016-12-26 HISTORY — DX: Acute upper respiratory infection, unspecified: J06.9

## 2016-12-28 DIAGNOSIS — R002 Palpitations: Secondary | ICD-10-CM | POA: Diagnosis not present

## 2016-12-31 DIAGNOSIS — M67911 Unspecified disorder of synovium and tendon, right shoulder: Secondary | ICD-10-CM | POA: Diagnosis not present

## 2016-12-31 DIAGNOSIS — M7551 Bursitis of right shoulder: Secondary | ICD-10-CM | POA: Diagnosis not present

## 2017-01-08 DIAGNOSIS — Z6828 Body mass index (BMI) 28.0-28.9, adult: Secondary | ICD-10-CM | POA: Diagnosis not present

## 2017-01-08 DIAGNOSIS — J329 Chronic sinusitis, unspecified: Secondary | ICD-10-CM | POA: Diagnosis not present

## 2017-01-08 DIAGNOSIS — H6090 Unspecified otitis externa, unspecified ear: Secondary | ICD-10-CM | POA: Diagnosis not present

## 2017-01-09 DIAGNOSIS — N952 Postmenopausal atrophic vaginitis: Secondary | ICD-10-CM | POA: Diagnosis not present

## 2017-01-09 DIAGNOSIS — R339 Retention of urine, unspecified: Secondary | ICD-10-CM | POA: Diagnosis not present

## 2017-01-09 DIAGNOSIS — N3090 Cystitis, unspecified without hematuria: Secondary | ICD-10-CM | POA: Diagnosis not present

## 2017-01-23 DIAGNOSIS — E785 Hyperlipidemia, unspecified: Secondary | ICD-10-CM | POA: Diagnosis not present

## 2017-01-23 DIAGNOSIS — R002 Palpitations: Secondary | ICD-10-CM | POA: Diagnosis not present

## 2017-01-23 DIAGNOSIS — I10 Essential (primary) hypertension: Secondary | ICD-10-CM | POA: Diagnosis not present

## 2017-02-11 DIAGNOSIS — M7551 Bursitis of right shoulder: Secondary | ICD-10-CM | POA: Diagnosis not present

## 2017-02-11 DIAGNOSIS — M67911 Unspecified disorder of synovium and tendon, right shoulder: Secondary | ICD-10-CM | POA: Diagnosis not present

## 2017-02-11 DIAGNOSIS — M75111 Incomplete rotator cuff tear or rupture of right shoulder, not specified as traumatic: Secondary | ICD-10-CM | POA: Diagnosis not present

## 2017-02-19 DIAGNOSIS — Z23 Encounter for immunization: Secondary | ICD-10-CM | POA: Diagnosis not present

## 2017-02-19 DIAGNOSIS — E785 Hyperlipidemia, unspecified: Secondary | ICD-10-CM | POA: Diagnosis not present

## 2017-02-19 DIAGNOSIS — N183 Chronic kidney disease, stage 3 (moderate): Secondary | ICD-10-CM | POA: Diagnosis not present

## 2017-03-06 DIAGNOSIS — R011 Cardiac murmur, unspecified: Secondary | ICD-10-CM | POA: Insufficient documentation

## 2017-03-06 DIAGNOSIS — I10 Essential (primary) hypertension: Secondary | ICD-10-CM | POA: Diagnosis not present

## 2017-03-06 DIAGNOSIS — R002 Palpitations: Secondary | ICD-10-CM | POA: Diagnosis not present

## 2017-03-06 DIAGNOSIS — E785 Hyperlipidemia, unspecified: Secondary | ICD-10-CM | POA: Diagnosis not present

## 2017-03-06 HISTORY — DX: Cardiac murmur, unspecified: R01.1

## 2017-03-08 DIAGNOSIS — R011 Cardiac murmur, unspecified: Secondary | ICD-10-CM | POA: Diagnosis not present

## 2017-03-08 DIAGNOSIS — R002 Palpitations: Secondary | ICD-10-CM | POA: Diagnosis not present

## 2017-03-18 DIAGNOSIS — I83812 Varicose veins of left lower extremities with pain: Secondary | ICD-10-CM | POA: Diagnosis not present

## 2017-03-18 DIAGNOSIS — I83813 Varicose veins of bilateral lower extremities with pain: Secondary | ICD-10-CM | POA: Diagnosis not present

## 2017-03-18 DIAGNOSIS — I82502 Chronic embolism and thrombosis of unspecified deep veins of left lower extremity: Secondary | ICD-10-CM | POA: Diagnosis not present

## 2017-03-18 DIAGNOSIS — I872 Venous insufficiency (chronic) (peripheral): Secondary | ICD-10-CM | POA: Diagnosis not present

## 2017-03-18 DIAGNOSIS — I82401 Acute embolism and thrombosis of unspecified deep veins of right lower extremity: Secondary | ICD-10-CM | POA: Diagnosis not present

## 2017-03-27 DIAGNOSIS — M7551 Bursitis of right shoulder: Secondary | ICD-10-CM | POA: Diagnosis not present

## 2017-03-27 DIAGNOSIS — M75111 Incomplete rotator cuff tear or rupture of right shoulder, not specified as traumatic: Secondary | ICD-10-CM | POA: Diagnosis not present

## 2017-04-12 DIAGNOSIS — I82401 Acute embolism and thrombosis of unspecified deep veins of right lower extremity: Secondary | ICD-10-CM | POA: Diagnosis not present

## 2017-04-12 DIAGNOSIS — I82502 Chronic embolism and thrombosis of unspecified deep veins of left lower extremity: Secondary | ICD-10-CM | POA: Diagnosis not present

## 2017-04-12 DIAGNOSIS — E7800 Pure hypercholesterolemia, unspecified: Secondary | ICD-10-CM | POA: Diagnosis not present

## 2017-04-12 DIAGNOSIS — Z9889 Other specified postprocedural states: Secondary | ICD-10-CM | POA: Diagnosis not present

## 2017-04-12 DIAGNOSIS — I10 Essential (primary) hypertension: Secondary | ICD-10-CM | POA: Diagnosis not present

## 2017-04-12 DIAGNOSIS — Z7982 Long term (current) use of aspirin: Secondary | ICD-10-CM | POA: Diagnosis not present

## 2017-04-12 DIAGNOSIS — Z79899 Other long term (current) drug therapy: Secondary | ICD-10-CM | POA: Diagnosis not present

## 2017-04-12 DIAGNOSIS — I83812 Varicose veins of left lower extremities with pain: Secondary | ICD-10-CM | POA: Diagnosis not present

## 2017-04-12 DIAGNOSIS — I825Z1 Chronic embolism and thrombosis of unspecified deep veins of right distal lower extremity: Secondary | ICD-10-CM | POA: Diagnosis not present

## 2017-04-12 DIAGNOSIS — I83813 Varicose veins of bilateral lower extremities with pain: Secondary | ICD-10-CM | POA: Diagnosis not present

## 2017-04-22 DIAGNOSIS — R002 Palpitations: Secondary | ICD-10-CM | POA: Diagnosis not present

## 2017-04-22 DIAGNOSIS — R011 Cardiac murmur, unspecified: Secondary | ICD-10-CM | POA: Diagnosis not present

## 2017-04-22 DIAGNOSIS — I10 Essential (primary) hypertension: Secondary | ICD-10-CM | POA: Diagnosis not present

## 2017-04-22 DIAGNOSIS — E785 Hyperlipidemia, unspecified: Secondary | ICD-10-CM | POA: Diagnosis not present

## 2017-04-25 DIAGNOSIS — R339 Retention of urine, unspecified: Secondary | ICD-10-CM | POA: Diagnosis not present

## 2017-04-25 DIAGNOSIS — N8111 Cystocele, midline: Secondary | ICD-10-CM | POA: Diagnosis not present

## 2017-04-25 DIAGNOSIS — N3090 Cystitis, unspecified without hematuria: Secondary | ICD-10-CM | POA: Diagnosis not present

## 2017-05-21 DIAGNOSIS — L570 Actinic keratosis: Secondary | ICD-10-CM | POA: Diagnosis not present

## 2017-05-23 DIAGNOSIS — L309 Dermatitis, unspecified: Secondary | ICD-10-CM | POA: Diagnosis not present

## 2017-05-23 DIAGNOSIS — H26493 Other secondary cataract, bilateral: Secondary | ICD-10-CM | POA: Diagnosis not present

## 2017-06-06 DIAGNOSIS — M67911 Unspecified disorder of synovium and tendon, right shoulder: Secondary | ICD-10-CM | POA: Diagnosis not present

## 2017-06-10 DIAGNOSIS — I10 Essential (primary) hypertension: Secondary | ICD-10-CM | POA: Diagnosis not present

## 2017-06-10 DIAGNOSIS — M19011 Primary osteoarthritis, right shoulder: Secondary | ICD-10-CM | POA: Diagnosis not present

## 2017-06-10 DIAGNOSIS — I872 Venous insufficiency (chronic) (peripheral): Secondary | ICD-10-CM | POA: Diagnosis not present

## 2017-06-10 DIAGNOSIS — E785 Hyperlipidemia, unspecified: Secondary | ICD-10-CM | POA: Diagnosis not present

## 2017-06-10 DIAGNOSIS — Z79899 Other long term (current) drug therapy: Secondary | ICD-10-CM | POA: Diagnosis not present

## 2017-06-21 DIAGNOSIS — M75111 Incomplete rotator cuff tear or rupture of right shoulder, not specified as traumatic: Secondary | ICD-10-CM | POA: Diagnosis not present

## 2017-06-21 DIAGNOSIS — N183 Chronic kidney disease, stage 3 (moderate): Secondary | ICD-10-CM | POA: Diagnosis not present

## 2017-06-21 DIAGNOSIS — Z79899 Other long term (current) drug therapy: Secondary | ICD-10-CM | POA: Diagnosis not present

## 2017-06-21 DIAGNOSIS — M1990 Unspecified osteoarthritis, unspecified site: Secondary | ICD-10-CM | POA: Diagnosis not present

## 2017-06-21 DIAGNOSIS — Z01810 Encounter for preprocedural cardiovascular examination: Secondary | ICD-10-CM | POA: Diagnosis not present

## 2017-06-21 DIAGNOSIS — I872 Venous insufficiency (chronic) (peripheral): Secondary | ICD-10-CM | POA: Diagnosis not present

## 2017-06-21 DIAGNOSIS — I10 Essential (primary) hypertension: Secondary | ICD-10-CM | POA: Diagnosis not present

## 2017-06-21 DIAGNOSIS — M75121 Complete rotator cuff tear or rupture of right shoulder, not specified as traumatic: Secondary | ICD-10-CM | POA: Diagnosis not present

## 2017-06-21 DIAGNOSIS — M75101 Unspecified rotator cuff tear or rupture of right shoulder, not specified as traumatic: Secondary | ICD-10-CM | POA: Diagnosis not present

## 2017-06-21 DIAGNOSIS — M67911 Unspecified disorder of synovium and tendon, right shoulder: Secondary | ICD-10-CM | POA: Diagnosis not present

## 2017-06-21 DIAGNOSIS — R55 Syncope and collapse: Secondary | ICD-10-CM | POA: Diagnosis not present

## 2017-06-21 DIAGNOSIS — E785 Hyperlipidemia, unspecified: Secondary | ICD-10-CM | POA: Diagnosis not present

## 2017-06-21 DIAGNOSIS — Z7982 Long term (current) use of aspirin: Secondary | ICD-10-CM | POA: Diagnosis not present

## 2017-06-21 DIAGNOSIS — K219 Gastro-esophageal reflux disease without esophagitis: Secondary | ICD-10-CM | POA: Diagnosis not present

## 2017-06-21 DIAGNOSIS — G8918 Other acute postprocedural pain: Secondary | ICD-10-CM | POA: Diagnosis not present

## 2017-06-21 DIAGNOSIS — M7551 Bursitis of right shoulder: Secondary | ICD-10-CM | POA: Diagnosis not present

## 2017-06-21 DIAGNOSIS — Z86718 Personal history of other venous thrombosis and embolism: Secondary | ICD-10-CM | POA: Diagnosis not present

## 2017-06-21 DIAGNOSIS — I129 Hypertensive chronic kidney disease with stage 1 through stage 4 chronic kidney disease, or unspecified chronic kidney disease: Secondary | ICD-10-CM | POA: Diagnosis not present

## 2017-06-24 DIAGNOSIS — M6281 Muscle weakness (generalized): Secondary | ICD-10-CM | POA: Diagnosis not present

## 2017-06-24 DIAGNOSIS — M25611 Stiffness of right shoulder, not elsewhere classified: Secondary | ICD-10-CM | POA: Diagnosis not present

## 2017-06-24 DIAGNOSIS — Z4789 Encounter for other orthopedic aftercare: Secondary | ICD-10-CM | POA: Diagnosis not present

## 2017-06-28 DIAGNOSIS — Z4789 Encounter for other orthopedic aftercare: Secondary | ICD-10-CM | POA: Diagnosis not present

## 2017-06-28 DIAGNOSIS — M6281 Muscle weakness (generalized): Secondary | ICD-10-CM | POA: Diagnosis not present

## 2017-06-28 DIAGNOSIS — M25611 Stiffness of right shoulder, not elsewhere classified: Secondary | ICD-10-CM | POA: Diagnosis not present

## 2017-07-01 DIAGNOSIS — M25611 Stiffness of right shoulder, not elsewhere classified: Secondary | ICD-10-CM | POA: Diagnosis not present

## 2017-07-01 DIAGNOSIS — M6281 Muscle weakness (generalized): Secondary | ICD-10-CM | POA: Diagnosis not present

## 2017-07-01 DIAGNOSIS — Z4789 Encounter for other orthopedic aftercare: Secondary | ICD-10-CM | POA: Diagnosis not present

## 2017-07-03 DIAGNOSIS — M6281 Muscle weakness (generalized): Secondary | ICD-10-CM | POA: Diagnosis not present

## 2017-07-03 DIAGNOSIS — Z4789 Encounter for other orthopedic aftercare: Secondary | ICD-10-CM | POA: Diagnosis not present

## 2017-07-03 DIAGNOSIS — M25611 Stiffness of right shoulder, not elsewhere classified: Secondary | ICD-10-CM | POA: Diagnosis not present

## 2017-07-08 DIAGNOSIS — M6281 Muscle weakness (generalized): Secondary | ICD-10-CM | POA: Diagnosis not present

## 2017-07-08 DIAGNOSIS — M25611 Stiffness of right shoulder, not elsewhere classified: Secondary | ICD-10-CM | POA: Diagnosis not present

## 2017-07-08 DIAGNOSIS — Z4789 Encounter for other orthopedic aftercare: Secondary | ICD-10-CM | POA: Diagnosis not present

## 2017-07-11 DIAGNOSIS — Z4789 Encounter for other orthopedic aftercare: Secondary | ICD-10-CM | POA: Diagnosis not present

## 2017-07-11 DIAGNOSIS — M25611 Stiffness of right shoulder, not elsewhere classified: Secondary | ICD-10-CM | POA: Diagnosis not present

## 2017-07-11 DIAGNOSIS — M6281 Muscle weakness (generalized): Secondary | ICD-10-CM | POA: Diagnosis not present

## 2017-07-16 DIAGNOSIS — Z4789 Encounter for other orthopedic aftercare: Secondary | ICD-10-CM | POA: Diagnosis not present

## 2017-07-16 DIAGNOSIS — M25611 Stiffness of right shoulder, not elsewhere classified: Secondary | ICD-10-CM | POA: Diagnosis not present

## 2017-07-16 DIAGNOSIS — M6281 Muscle weakness (generalized): Secondary | ICD-10-CM | POA: Diagnosis not present

## 2017-07-19 DIAGNOSIS — Z4789 Encounter for other orthopedic aftercare: Secondary | ICD-10-CM | POA: Diagnosis not present

## 2017-07-19 DIAGNOSIS — M6281 Muscle weakness (generalized): Secondary | ICD-10-CM | POA: Diagnosis not present

## 2017-07-19 DIAGNOSIS — M25611 Stiffness of right shoulder, not elsewhere classified: Secondary | ICD-10-CM | POA: Diagnosis not present

## 2017-07-22 DIAGNOSIS — Z4789 Encounter for other orthopedic aftercare: Secondary | ICD-10-CM | POA: Diagnosis not present

## 2017-07-22 DIAGNOSIS — M6281 Muscle weakness (generalized): Secondary | ICD-10-CM | POA: Diagnosis not present

## 2017-07-22 DIAGNOSIS — M25611 Stiffness of right shoulder, not elsewhere classified: Secondary | ICD-10-CM | POA: Diagnosis not present

## 2017-07-25 DIAGNOSIS — M6281 Muscle weakness (generalized): Secondary | ICD-10-CM | POA: Diagnosis not present

## 2017-07-25 DIAGNOSIS — M25611 Stiffness of right shoulder, not elsewhere classified: Secondary | ICD-10-CM | POA: Diagnosis not present

## 2017-07-25 DIAGNOSIS — Z4789 Encounter for other orthopedic aftercare: Secondary | ICD-10-CM | POA: Diagnosis not present

## 2017-07-29 DIAGNOSIS — Z4789 Encounter for other orthopedic aftercare: Secondary | ICD-10-CM | POA: Diagnosis not present

## 2017-07-29 DIAGNOSIS — M25611 Stiffness of right shoulder, not elsewhere classified: Secondary | ICD-10-CM | POA: Diagnosis not present

## 2017-07-29 DIAGNOSIS — M6281 Muscle weakness (generalized): Secondary | ICD-10-CM | POA: Diagnosis not present

## 2017-08-01 DIAGNOSIS — M25611 Stiffness of right shoulder, not elsewhere classified: Secondary | ICD-10-CM | POA: Diagnosis not present

## 2017-08-01 DIAGNOSIS — M6281 Muscle weakness (generalized): Secondary | ICD-10-CM | POA: Diagnosis not present

## 2017-08-01 DIAGNOSIS — Z4789 Encounter for other orthopedic aftercare: Secondary | ICD-10-CM | POA: Diagnosis not present

## 2017-08-05 DIAGNOSIS — M6281 Muscle weakness (generalized): Secondary | ICD-10-CM | POA: Diagnosis not present

## 2017-08-05 DIAGNOSIS — M25611 Stiffness of right shoulder, not elsewhere classified: Secondary | ICD-10-CM | POA: Diagnosis not present

## 2017-08-05 DIAGNOSIS — Z4789 Encounter for other orthopedic aftercare: Secondary | ICD-10-CM | POA: Diagnosis not present

## 2017-08-08 DIAGNOSIS — M25611 Stiffness of right shoulder, not elsewhere classified: Secondary | ICD-10-CM | POA: Diagnosis not present

## 2017-08-08 DIAGNOSIS — M6281 Muscle weakness (generalized): Secondary | ICD-10-CM | POA: Diagnosis not present

## 2017-08-08 DIAGNOSIS — Z4789 Encounter for other orthopedic aftercare: Secondary | ICD-10-CM | POA: Diagnosis not present

## 2017-08-12 DIAGNOSIS — M25611 Stiffness of right shoulder, not elsewhere classified: Secondary | ICD-10-CM | POA: Diagnosis not present

## 2017-08-12 DIAGNOSIS — M6281 Muscle weakness (generalized): Secondary | ICD-10-CM | POA: Diagnosis not present

## 2017-08-12 DIAGNOSIS — Z4789 Encounter for other orthopedic aftercare: Secondary | ICD-10-CM | POA: Diagnosis not present

## 2017-08-15 DIAGNOSIS — M6281 Muscle weakness (generalized): Secondary | ICD-10-CM | POA: Diagnosis not present

## 2017-08-15 DIAGNOSIS — Z4789 Encounter for other orthopedic aftercare: Secondary | ICD-10-CM | POA: Diagnosis not present

## 2017-08-15 DIAGNOSIS — M25611 Stiffness of right shoulder, not elsewhere classified: Secondary | ICD-10-CM | POA: Diagnosis not present

## 2017-08-19 DIAGNOSIS — Z4789 Encounter for other orthopedic aftercare: Secondary | ICD-10-CM | POA: Diagnosis not present

## 2017-08-19 DIAGNOSIS — M25611 Stiffness of right shoulder, not elsewhere classified: Secondary | ICD-10-CM | POA: Diagnosis not present

## 2017-08-19 DIAGNOSIS — M6281 Muscle weakness (generalized): Secondary | ICD-10-CM | POA: Diagnosis not present

## 2017-08-21 DIAGNOSIS — R310 Gross hematuria: Secondary | ICD-10-CM | POA: Diagnosis not present

## 2017-08-21 DIAGNOSIS — N390 Urinary tract infection, site not specified: Secondary | ICD-10-CM | POA: Diagnosis not present

## 2017-08-22 DIAGNOSIS — M6281 Muscle weakness (generalized): Secondary | ICD-10-CM | POA: Diagnosis not present

## 2017-08-22 DIAGNOSIS — Z4789 Encounter for other orthopedic aftercare: Secondary | ICD-10-CM | POA: Diagnosis not present

## 2017-08-22 DIAGNOSIS — M25611 Stiffness of right shoulder, not elsewhere classified: Secondary | ICD-10-CM | POA: Diagnosis not present

## 2017-08-26 DIAGNOSIS — E785 Hyperlipidemia, unspecified: Secondary | ICD-10-CM | POA: Diagnosis not present

## 2017-08-26 DIAGNOSIS — M25611 Stiffness of right shoulder, not elsewhere classified: Secondary | ICD-10-CM | POA: Diagnosis not present

## 2017-08-26 DIAGNOSIS — R42 Dizziness and giddiness: Secondary | ICD-10-CM | POA: Insufficient documentation

## 2017-08-26 DIAGNOSIS — M6281 Muscle weakness (generalized): Secondary | ICD-10-CM | POA: Diagnosis not present

## 2017-08-26 DIAGNOSIS — R011 Cardiac murmur, unspecified: Secondary | ICD-10-CM | POA: Diagnosis not present

## 2017-08-26 DIAGNOSIS — I10 Essential (primary) hypertension: Secondary | ICD-10-CM | POA: Diagnosis not present

## 2017-08-26 DIAGNOSIS — Z4789 Encounter for other orthopedic aftercare: Secondary | ICD-10-CM | POA: Diagnosis not present

## 2017-08-26 DIAGNOSIS — R002 Palpitations: Secondary | ICD-10-CM | POA: Diagnosis not present

## 2017-08-26 HISTORY — DX: Dizziness and giddiness: R42

## 2017-08-29 DIAGNOSIS — J40 Bronchitis, not specified as acute or chronic: Secondary | ICD-10-CM | POA: Diagnosis not present

## 2017-08-29 DIAGNOSIS — Z6829 Body mass index (BMI) 29.0-29.9, adult: Secondary | ICD-10-CM | POA: Diagnosis not present

## 2017-08-29 DIAGNOSIS — J329 Chronic sinusitis, unspecified: Secondary | ICD-10-CM | POA: Diagnosis not present

## 2017-09-02 DIAGNOSIS — Z4789 Encounter for other orthopedic aftercare: Secondary | ICD-10-CM | POA: Diagnosis not present

## 2017-09-02 DIAGNOSIS — M25611 Stiffness of right shoulder, not elsewhere classified: Secondary | ICD-10-CM | POA: Diagnosis not present

## 2017-09-02 DIAGNOSIS — M6281 Muscle weakness (generalized): Secondary | ICD-10-CM | POA: Diagnosis not present

## 2017-09-04 DIAGNOSIS — J40 Bronchitis, not specified as acute or chronic: Secondary | ICD-10-CM | POA: Diagnosis not present

## 2017-09-04 DIAGNOSIS — J329 Chronic sinusitis, unspecified: Secondary | ICD-10-CM | POA: Diagnosis not present

## 2017-09-04 DIAGNOSIS — Z6828 Body mass index (BMI) 28.0-28.9, adult: Secondary | ICD-10-CM | POA: Diagnosis not present

## 2017-09-05 DIAGNOSIS — Z4789 Encounter for other orthopedic aftercare: Secondary | ICD-10-CM | POA: Diagnosis not present

## 2017-09-05 DIAGNOSIS — M6281 Muscle weakness (generalized): Secondary | ICD-10-CM | POA: Diagnosis not present

## 2017-09-05 DIAGNOSIS — M25611 Stiffness of right shoulder, not elsewhere classified: Secondary | ICD-10-CM | POA: Diagnosis not present

## 2017-09-09 DIAGNOSIS — Z4789 Encounter for other orthopedic aftercare: Secondary | ICD-10-CM | POA: Diagnosis not present

## 2017-09-09 DIAGNOSIS — M25611 Stiffness of right shoulder, not elsewhere classified: Secondary | ICD-10-CM | POA: Diagnosis not present

## 2017-09-09 DIAGNOSIS — M6281 Muscle weakness (generalized): Secondary | ICD-10-CM | POA: Diagnosis not present

## 2017-09-12 DIAGNOSIS — Z4789 Encounter for other orthopedic aftercare: Secondary | ICD-10-CM | POA: Diagnosis not present

## 2017-09-12 DIAGNOSIS — M6281 Muscle weakness (generalized): Secondary | ICD-10-CM | POA: Diagnosis not present

## 2017-09-12 DIAGNOSIS — M25611 Stiffness of right shoulder, not elsewhere classified: Secondary | ICD-10-CM | POA: Diagnosis not present

## 2017-09-17 DIAGNOSIS — M6281 Muscle weakness (generalized): Secondary | ICD-10-CM | POA: Diagnosis not present

## 2017-09-17 DIAGNOSIS — R002 Palpitations: Secondary | ICD-10-CM | POA: Diagnosis not present

## 2017-09-17 DIAGNOSIS — Z4789 Encounter for other orthopedic aftercare: Secondary | ICD-10-CM | POA: Diagnosis not present

## 2017-09-17 DIAGNOSIS — R42 Dizziness and giddiness: Secondary | ICD-10-CM | POA: Diagnosis not present

## 2017-09-17 DIAGNOSIS — M25611 Stiffness of right shoulder, not elsewhere classified: Secondary | ICD-10-CM | POA: Diagnosis not present

## 2017-09-20 DIAGNOSIS — R310 Gross hematuria: Secondary | ICD-10-CM | POA: Diagnosis not present

## 2017-09-20 DIAGNOSIS — R339 Retention of urine, unspecified: Secondary | ICD-10-CM | POA: Diagnosis not present

## 2017-09-20 DIAGNOSIS — M25611 Stiffness of right shoulder, not elsewhere classified: Secondary | ICD-10-CM | POA: Diagnosis not present

## 2017-09-20 DIAGNOSIS — Z4789 Encounter for other orthopedic aftercare: Secondary | ICD-10-CM | POA: Diagnosis not present

## 2017-09-20 DIAGNOSIS — M6281 Muscle weakness (generalized): Secondary | ICD-10-CM | POA: Diagnosis not present

## 2017-09-20 DIAGNOSIS — N3090 Cystitis, unspecified without hematuria: Secondary | ICD-10-CM | POA: Diagnosis not present

## 2017-09-20 DIAGNOSIS — N8111 Cystocele, midline: Secondary | ICD-10-CM | POA: Diagnosis not present

## 2017-09-23 DIAGNOSIS — Z4789 Encounter for other orthopedic aftercare: Secondary | ICD-10-CM | POA: Diagnosis not present

## 2017-09-23 DIAGNOSIS — M25611 Stiffness of right shoulder, not elsewhere classified: Secondary | ICD-10-CM | POA: Diagnosis not present

## 2017-09-23 DIAGNOSIS — M6281 Muscle weakness (generalized): Secondary | ICD-10-CM | POA: Diagnosis not present

## 2017-09-26 DIAGNOSIS — M6281 Muscle weakness (generalized): Secondary | ICD-10-CM | POA: Diagnosis not present

## 2017-09-26 DIAGNOSIS — M25611 Stiffness of right shoulder, not elsewhere classified: Secondary | ICD-10-CM | POA: Diagnosis not present

## 2017-09-26 DIAGNOSIS — Z4789 Encounter for other orthopedic aftercare: Secondary | ICD-10-CM | POA: Diagnosis not present

## 2017-09-27 DIAGNOSIS — N3091 Cystitis, unspecified with hematuria: Secondary | ICD-10-CM | POA: Diagnosis not present

## 2017-09-27 DIAGNOSIS — R310 Gross hematuria: Secondary | ICD-10-CM | POA: Diagnosis not present

## 2017-10-14 DIAGNOSIS — I10 Essential (primary) hypertension: Secondary | ICD-10-CM | POA: Diagnosis not present

## 2017-10-14 DIAGNOSIS — R002 Palpitations: Secondary | ICD-10-CM | POA: Diagnosis not present

## 2017-10-14 DIAGNOSIS — R42 Dizziness and giddiness: Secondary | ICD-10-CM | POA: Diagnosis not present

## 2017-10-14 DIAGNOSIS — E785 Hyperlipidemia, unspecified: Secondary | ICD-10-CM | POA: Diagnosis not present

## 2017-11-04 DIAGNOSIS — M67911 Unspecified disorder of synovium and tendon, right shoulder: Secondary | ICD-10-CM | POA: Diagnosis not present

## 2017-11-25 DIAGNOSIS — E785 Hyperlipidemia, unspecified: Secondary | ICD-10-CM | POA: Diagnosis not present

## 2017-11-25 DIAGNOSIS — I10 Essential (primary) hypertension: Secondary | ICD-10-CM | POA: Diagnosis not present

## 2017-11-25 DIAGNOSIS — N183 Chronic kidney disease, stage 3 (moderate): Secondary | ICD-10-CM | POA: Diagnosis not present

## 2017-11-25 DIAGNOSIS — Z79899 Other long term (current) drug therapy: Secondary | ICD-10-CM | POA: Diagnosis not present

## 2017-11-25 DIAGNOSIS — Z9889 Other specified postprocedural states: Secondary | ICD-10-CM | POA: Diagnosis not present

## 2017-11-25 DIAGNOSIS — Z0000 Encounter for general adult medical examination without abnormal findings: Secondary | ICD-10-CM | POA: Diagnosis not present

## 2017-12-12 DIAGNOSIS — Z1231 Encounter for screening mammogram for malignant neoplasm of breast: Secondary | ICD-10-CM | POA: Diagnosis not present

## 2017-12-16 DIAGNOSIS — M67911 Unspecified disorder of synovium and tendon, right shoulder: Secondary | ICD-10-CM | POA: Diagnosis not present

## 2017-12-25 DIAGNOSIS — R339 Retention of urine, unspecified: Secondary | ICD-10-CM | POA: Diagnosis not present

## 2017-12-25 DIAGNOSIS — N3090 Cystitis, unspecified without hematuria: Secondary | ICD-10-CM | POA: Diagnosis not present

## 2018-01-03 DIAGNOSIS — N390 Urinary tract infection, site not specified: Secondary | ICD-10-CM | POA: Diagnosis not present

## 2018-01-15 DIAGNOSIS — E785 Hyperlipidemia, unspecified: Secondary | ICD-10-CM | POA: Diagnosis not present

## 2018-01-15 DIAGNOSIS — R002 Palpitations: Secondary | ICD-10-CM | POA: Diagnosis not present

## 2018-01-15 DIAGNOSIS — I10 Essential (primary) hypertension: Secondary | ICD-10-CM | POA: Diagnosis not present

## 2018-01-15 DIAGNOSIS — M79604 Pain in right leg: Secondary | ICD-10-CM

## 2018-01-15 DIAGNOSIS — R42 Dizziness and giddiness: Secondary | ICD-10-CM | POA: Diagnosis not present

## 2018-01-15 DIAGNOSIS — M79605 Pain in left leg: Secondary | ICD-10-CM

## 2018-01-15 HISTORY — DX: Pain in right leg: M79.605

## 2018-01-15 HISTORY — DX: Pain in right leg: M79.604

## 2018-01-23 DIAGNOSIS — M79605 Pain in left leg: Secondary | ICD-10-CM | POA: Diagnosis not present

## 2018-01-23 DIAGNOSIS — M79604 Pain in right leg: Secondary | ICD-10-CM | POA: Diagnosis not present

## 2018-01-24 DIAGNOSIS — R05 Cough: Secondary | ICD-10-CM | POA: Diagnosis not present

## 2018-01-24 DIAGNOSIS — J302 Other seasonal allergic rhinitis: Secondary | ICD-10-CM | POA: Diagnosis not present

## 2018-01-24 DIAGNOSIS — B9689 Other specified bacterial agents as the cause of diseases classified elsewhere: Secondary | ICD-10-CM | POA: Diagnosis not present

## 2018-01-24 DIAGNOSIS — J019 Acute sinusitis, unspecified: Secondary | ICD-10-CM | POA: Diagnosis not present

## 2018-02-06 DIAGNOSIS — J069 Acute upper respiratory infection, unspecified: Secondary | ICD-10-CM | POA: Diagnosis not present

## 2018-02-06 DIAGNOSIS — I10 Essential (primary) hypertension: Secondary | ICD-10-CM | POA: Diagnosis not present

## 2018-02-06 DIAGNOSIS — Z6829 Body mass index (BMI) 29.0-29.9, adult: Secondary | ICD-10-CM | POA: Diagnosis not present

## 2018-02-06 DIAGNOSIS — Z23 Encounter for immunization: Secondary | ICD-10-CM | POA: Diagnosis not present

## 2018-02-11 DIAGNOSIS — Z6829 Body mass index (BMI) 29.0-29.9, adult: Secondary | ICD-10-CM | POA: Diagnosis not present

## 2018-02-11 DIAGNOSIS — J069 Acute upper respiratory infection, unspecified: Secondary | ICD-10-CM | POA: Diagnosis not present

## 2018-02-11 DIAGNOSIS — J40 Bronchitis, not specified as acute or chronic: Secondary | ICD-10-CM | POA: Diagnosis not present

## 2018-04-21 DIAGNOSIS — I10 Essential (primary) hypertension: Secondary | ICD-10-CM | POA: Diagnosis not present

## 2018-04-21 DIAGNOSIS — R002 Palpitations: Secondary | ICD-10-CM | POA: Diagnosis not present

## 2018-04-21 DIAGNOSIS — E785 Hyperlipidemia, unspecified: Secondary | ICD-10-CM | POA: Diagnosis not present

## 2018-04-28 DIAGNOSIS — I10 Essential (primary) hypertension: Secondary | ICD-10-CM | POA: Diagnosis not present

## 2018-04-28 DIAGNOSIS — R05 Cough: Secondary | ICD-10-CM | POA: Diagnosis not present

## 2018-04-28 DIAGNOSIS — J3089 Other allergic rhinitis: Secondary | ICD-10-CM | POA: Diagnosis not present

## 2018-04-28 DIAGNOSIS — J40 Bronchitis, not specified as acute or chronic: Secondary | ICD-10-CM | POA: Diagnosis not present

## 2018-04-29 DIAGNOSIS — N3090 Cystitis, unspecified without hematuria: Secondary | ICD-10-CM | POA: Diagnosis not present

## 2018-04-29 DIAGNOSIS — N390 Urinary tract infection, site not specified: Secondary | ICD-10-CM | POA: Diagnosis not present

## 2018-04-29 DIAGNOSIS — M67911 Unspecified disorder of synovium and tendon, right shoulder: Secondary | ICD-10-CM | POA: Diagnosis not present

## 2018-04-29 DIAGNOSIS — N3289 Other specified disorders of bladder: Secondary | ICD-10-CM | POA: Diagnosis not present

## 2018-05-26 DIAGNOSIS — H26492 Other secondary cataract, left eye: Secondary | ICD-10-CM | POA: Diagnosis not present

## 2018-06-17 DIAGNOSIS — J329 Chronic sinusitis, unspecified: Secondary | ICD-10-CM | POA: Diagnosis not present

## 2018-06-17 DIAGNOSIS — J40 Bronchitis, not specified as acute or chronic: Secondary | ICD-10-CM | POA: Diagnosis not present

## 2018-06-30 DIAGNOSIS — J209 Acute bronchitis, unspecified: Secondary | ICD-10-CM | POA: Diagnosis not present

## 2018-06-30 DIAGNOSIS — J3089 Other allergic rhinitis: Secondary | ICD-10-CM | POA: Diagnosis not present

## 2018-06-30 DIAGNOSIS — R05 Cough: Secondary | ICD-10-CM | POA: Diagnosis not present

## 2018-06-30 DIAGNOSIS — L0100 Impetigo, unspecified: Secondary | ICD-10-CM | POA: Diagnosis not present

## 2018-07-07 DIAGNOSIS — J3089 Other allergic rhinitis: Secondary | ICD-10-CM | POA: Diagnosis not present

## 2018-07-07 DIAGNOSIS — H6980 Other specified disorders of Eustachian tube, unspecified ear: Secondary | ICD-10-CM | POA: Diagnosis not present

## 2018-07-07 DIAGNOSIS — R05 Cough: Secondary | ICD-10-CM | POA: Diagnosis not present

## 2018-07-07 DIAGNOSIS — H6501 Acute serous otitis media, right ear: Secondary | ICD-10-CM | POA: Diagnosis not present

## 2018-07-24 DIAGNOSIS — N390 Urinary tract infection, site not specified: Secondary | ICD-10-CM | POA: Diagnosis not present

## 2018-08-07 DIAGNOSIS — M1990 Unspecified osteoarthritis, unspecified site: Secondary | ICD-10-CM | POA: Diagnosis not present

## 2018-08-07 DIAGNOSIS — N183 Chronic kidney disease, stage 3 (moderate): Secondary | ICD-10-CM | POA: Diagnosis not present

## 2018-08-07 DIAGNOSIS — S81801A Unspecified open wound, right lower leg, initial encounter: Secondary | ICD-10-CM | POA: Diagnosis not present

## 2018-08-07 DIAGNOSIS — I739 Peripheral vascular disease, unspecified: Secondary | ICD-10-CM | POA: Diagnosis not present

## 2018-08-07 DIAGNOSIS — I7300 Raynaud's syndrome without gangrene: Secondary | ICD-10-CM | POA: Diagnosis not present

## 2018-08-07 DIAGNOSIS — I129 Hypertensive chronic kidney disease with stage 1 through stage 4 chronic kidney disease, or unspecified chronic kidney disease: Secondary | ICD-10-CM | POA: Diagnosis not present

## 2018-08-07 DIAGNOSIS — T8189XA Other complications of procedures, not elsewhere classified, initial encounter: Secondary | ICD-10-CM | POA: Diagnosis not present

## 2018-08-13 DIAGNOSIS — I878 Other specified disorders of veins: Secondary | ICD-10-CM | POA: Diagnosis not present

## 2018-08-13 DIAGNOSIS — S81801A Unspecified open wound, right lower leg, initial encounter: Secondary | ICD-10-CM | POA: Diagnosis not present

## 2018-08-13 DIAGNOSIS — I872 Venous insufficiency (chronic) (peripheral): Secondary | ICD-10-CM | POA: Diagnosis not present

## 2018-08-13 DIAGNOSIS — I10 Essential (primary) hypertension: Secondary | ICD-10-CM | POA: Diagnosis not present

## 2018-08-13 DIAGNOSIS — L97812 Non-pressure chronic ulcer of other part of right lower leg with fat layer exposed: Secondary | ICD-10-CM | POA: Diagnosis not present

## 2018-08-21 DIAGNOSIS — S81801A Unspecified open wound, right lower leg, initial encounter: Secondary | ICD-10-CM | POA: Diagnosis not present

## 2018-08-21 DIAGNOSIS — I872 Venous insufficiency (chronic) (peripheral): Secondary | ICD-10-CM | POA: Diagnosis not present

## 2018-08-21 DIAGNOSIS — L97812 Non-pressure chronic ulcer of other part of right lower leg with fat layer exposed: Secondary | ICD-10-CM | POA: Diagnosis not present

## 2018-08-28 DIAGNOSIS — L97812 Non-pressure chronic ulcer of other part of right lower leg with fat layer exposed: Secondary | ICD-10-CM | POA: Diagnosis not present

## 2018-08-28 DIAGNOSIS — I872 Venous insufficiency (chronic) (peripheral): Secondary | ICD-10-CM | POA: Diagnosis not present

## 2018-08-28 DIAGNOSIS — S81801A Unspecified open wound, right lower leg, initial encounter: Secondary | ICD-10-CM | POA: Diagnosis not present

## 2018-09-03 DIAGNOSIS — I872 Venous insufficiency (chronic) (peripheral): Secondary | ICD-10-CM | POA: Diagnosis not present

## 2018-09-03 DIAGNOSIS — L97812 Non-pressure chronic ulcer of other part of right lower leg with fat layer exposed: Secondary | ICD-10-CM | POA: Diagnosis not present

## 2018-09-03 DIAGNOSIS — S81801A Unspecified open wound, right lower leg, initial encounter: Secondary | ICD-10-CM | POA: Diagnosis not present

## 2018-09-03 DIAGNOSIS — T8189XA Other complications of procedures, not elsewhere classified, initial encounter: Secondary | ICD-10-CM | POA: Diagnosis not present

## 2018-09-10 DIAGNOSIS — I872 Venous insufficiency (chronic) (peripheral): Secondary | ICD-10-CM | POA: Diagnosis not present

## 2018-09-10 DIAGNOSIS — S81801A Unspecified open wound, right lower leg, initial encounter: Secondary | ICD-10-CM | POA: Diagnosis not present

## 2018-09-10 DIAGNOSIS — L97812 Non-pressure chronic ulcer of other part of right lower leg with fat layer exposed: Secondary | ICD-10-CM | POA: Diagnosis not present

## 2018-09-11 DIAGNOSIS — N952 Postmenopausal atrophic vaginitis: Secondary | ICD-10-CM | POA: Diagnosis not present

## 2018-09-11 DIAGNOSIS — N3090 Cystitis, unspecified without hematuria: Secondary | ICD-10-CM | POA: Diagnosis not present

## 2018-09-11 DIAGNOSIS — N3289 Other specified disorders of bladder: Secondary | ICD-10-CM | POA: Diagnosis not present

## 2018-09-17 DIAGNOSIS — L97812 Non-pressure chronic ulcer of other part of right lower leg with fat layer exposed: Secondary | ICD-10-CM | POA: Diagnosis not present

## 2018-09-17 DIAGNOSIS — I872 Venous insufficiency (chronic) (peripheral): Secondary | ICD-10-CM | POA: Diagnosis not present

## 2018-09-17 DIAGNOSIS — S81801A Unspecified open wound, right lower leg, initial encounter: Secondary | ICD-10-CM | POA: Diagnosis not present

## 2018-09-22 DIAGNOSIS — T8189XA Other complications of procedures, not elsewhere classified, initial encounter: Secondary | ICD-10-CM | POA: Diagnosis not present

## 2018-09-22 DIAGNOSIS — S81801A Unspecified open wound, right lower leg, initial encounter: Secondary | ICD-10-CM | POA: Diagnosis not present

## 2018-09-22 DIAGNOSIS — I872 Venous insufficiency (chronic) (peripheral): Secondary | ICD-10-CM | POA: Diagnosis not present

## 2018-09-22 DIAGNOSIS — L97812 Non-pressure chronic ulcer of other part of right lower leg with fat layer exposed: Secondary | ICD-10-CM | POA: Diagnosis not present

## 2018-10-02 DIAGNOSIS — T8189XA Other complications of procedures, not elsewhere classified, initial encounter: Secondary | ICD-10-CM | POA: Diagnosis not present

## 2018-10-02 DIAGNOSIS — S81801A Unspecified open wound, right lower leg, initial encounter: Secondary | ICD-10-CM | POA: Diagnosis not present

## 2018-10-02 DIAGNOSIS — L97812 Non-pressure chronic ulcer of other part of right lower leg with fat layer exposed: Secondary | ICD-10-CM | POA: Diagnosis not present

## 2018-10-02 DIAGNOSIS — I872 Venous insufficiency (chronic) (peripheral): Secondary | ICD-10-CM | POA: Diagnosis not present

## 2018-10-09 DIAGNOSIS — T8189XA Other complications of procedures, not elsewhere classified, initial encounter: Secondary | ICD-10-CM | POA: Diagnosis not present

## 2018-10-09 DIAGNOSIS — S81801A Unspecified open wound, right lower leg, initial encounter: Secondary | ICD-10-CM | POA: Diagnosis not present

## 2018-10-09 DIAGNOSIS — I872 Venous insufficiency (chronic) (peripheral): Secondary | ICD-10-CM | POA: Diagnosis not present

## 2018-10-09 DIAGNOSIS — L97812 Non-pressure chronic ulcer of other part of right lower leg with fat layer exposed: Secondary | ICD-10-CM | POA: Diagnosis not present

## 2018-10-16 DIAGNOSIS — I872 Venous insufficiency (chronic) (peripheral): Secondary | ICD-10-CM | POA: Diagnosis not present

## 2018-10-16 DIAGNOSIS — L97812 Non-pressure chronic ulcer of other part of right lower leg with fat layer exposed: Secondary | ICD-10-CM | POA: Diagnosis not present

## 2018-10-16 DIAGNOSIS — S81801A Unspecified open wound, right lower leg, initial encounter: Secondary | ICD-10-CM | POA: Diagnosis not present

## 2018-10-16 DIAGNOSIS — T8189XA Other complications of procedures, not elsewhere classified, initial encounter: Secondary | ICD-10-CM | POA: Diagnosis not present

## 2018-10-21 DIAGNOSIS — I872 Venous insufficiency (chronic) (peripheral): Secondary | ICD-10-CM | POA: Diagnosis not present

## 2018-10-21 DIAGNOSIS — S81801A Unspecified open wound, right lower leg, initial encounter: Secondary | ICD-10-CM | POA: Diagnosis not present

## 2018-10-28 DIAGNOSIS — S81801A Unspecified open wound, right lower leg, initial encounter: Secondary | ICD-10-CM | POA: Diagnosis not present

## 2018-10-28 DIAGNOSIS — I872 Venous insufficiency (chronic) (peripheral): Secondary | ICD-10-CM | POA: Diagnosis not present

## 2018-10-28 DIAGNOSIS — L97812 Non-pressure chronic ulcer of other part of right lower leg with fat layer exposed: Secondary | ICD-10-CM | POA: Diagnosis not present

## 2018-11-03 DIAGNOSIS — R002 Palpitations: Secondary | ICD-10-CM | POA: Diagnosis not present

## 2018-11-03 DIAGNOSIS — R5381 Other malaise: Secondary | ICD-10-CM | POA: Insufficient documentation

## 2018-11-03 DIAGNOSIS — I10 Essential (primary) hypertension: Secondary | ICD-10-CM | POA: Diagnosis not present

## 2018-11-03 DIAGNOSIS — R5383 Other fatigue: Secondary | ICD-10-CM | POA: Diagnosis not present

## 2018-11-03 DIAGNOSIS — E785 Hyperlipidemia, unspecified: Secondary | ICD-10-CM | POA: Diagnosis not present

## 2018-11-03 HISTORY — DX: Other malaise: R53.81

## 2018-11-04 DIAGNOSIS — S81801A Unspecified open wound, right lower leg, initial encounter: Secondary | ICD-10-CM | POA: Diagnosis not present

## 2018-11-04 DIAGNOSIS — I872 Venous insufficiency (chronic) (peripheral): Secondary | ICD-10-CM | POA: Diagnosis not present

## 2018-11-04 DIAGNOSIS — T8189XA Other complications of procedures, not elsewhere classified, initial encounter: Secondary | ICD-10-CM | POA: Diagnosis not present

## 2018-11-04 DIAGNOSIS — L97812 Non-pressure chronic ulcer of other part of right lower leg with fat layer exposed: Secondary | ICD-10-CM | POA: Diagnosis not present

## 2018-11-11 DIAGNOSIS — Z872 Personal history of diseases of the skin and subcutaneous tissue: Secondary | ICD-10-CM | POA: Diagnosis not present

## 2018-11-11 DIAGNOSIS — Z09 Encounter for follow-up examination after completed treatment for conditions other than malignant neoplasm: Secondary | ICD-10-CM | POA: Diagnosis not present

## 2018-11-11 DIAGNOSIS — I872 Venous insufficiency (chronic) (peripheral): Secondary | ICD-10-CM | POA: Diagnosis not present

## 2018-11-11 DIAGNOSIS — T8189XA Other complications of procedures, not elsewhere classified, initial encounter: Secondary | ICD-10-CM | POA: Diagnosis not present

## 2018-11-11 DIAGNOSIS — L97819 Non-pressure chronic ulcer of other part of right lower leg with unspecified severity: Secondary | ICD-10-CM | POA: Diagnosis not present

## 2018-11-27 DIAGNOSIS — E559 Vitamin D deficiency, unspecified: Secondary | ICD-10-CM | POA: Diagnosis not present

## 2018-11-27 DIAGNOSIS — R739 Hyperglycemia, unspecified: Secondary | ICD-10-CM | POA: Diagnosis not present

## 2018-11-27 DIAGNOSIS — Z0000 Encounter for general adult medical examination without abnormal findings: Secondary | ICD-10-CM | POA: Diagnosis not present

## 2018-11-27 DIAGNOSIS — I10 Essential (primary) hypertension: Secondary | ICD-10-CM | POA: Diagnosis not present

## 2018-11-27 DIAGNOSIS — N183 Chronic kidney disease, stage 3 (moderate): Secondary | ICD-10-CM | POA: Diagnosis not present

## 2018-11-27 DIAGNOSIS — Z79899 Other long term (current) drug therapy: Secondary | ICD-10-CM | POA: Diagnosis not present

## 2018-11-27 DIAGNOSIS — I872 Venous insufficiency (chronic) (peripheral): Secondary | ICD-10-CM | POA: Diagnosis not present

## 2018-11-27 DIAGNOSIS — E785 Hyperlipidemia, unspecified: Secondary | ICD-10-CM | POA: Diagnosis not present

## 2019-01-05 DIAGNOSIS — N390 Urinary tract infection, site not specified: Secondary | ICD-10-CM | POA: Diagnosis not present

## 2019-01-12 DIAGNOSIS — N3289 Other specified disorders of bladder: Secondary | ICD-10-CM | POA: Diagnosis not present

## 2019-01-12 DIAGNOSIS — N390 Urinary tract infection, site not specified: Secondary | ICD-10-CM | POA: Diagnosis not present

## 2019-01-12 DIAGNOSIS — K5900 Constipation, unspecified: Secondary | ICD-10-CM | POA: Diagnosis not present

## 2019-01-27 DIAGNOSIS — Z23 Encounter for immunization: Secondary | ICD-10-CM | POA: Diagnosis not present

## 2019-02-02 DIAGNOSIS — Z96659 Presence of unspecified artificial knee joint: Secondary | ICD-10-CM | POA: Diagnosis not present

## 2019-02-16 DIAGNOSIS — N390 Urinary tract infection, site not specified: Secondary | ICD-10-CM | POA: Diagnosis not present

## 2019-02-19 DIAGNOSIS — E785 Hyperlipidemia, unspecified: Secondary | ICD-10-CM | POA: Diagnosis not present

## 2019-02-19 DIAGNOSIS — Z79899 Other long term (current) drug therapy: Secondary | ICD-10-CM | POA: Diagnosis not present

## 2019-03-03 DIAGNOSIS — Z96659 Presence of unspecified artificial knee joint: Secondary | ICD-10-CM | POA: Diagnosis not present

## 2019-03-27 DIAGNOSIS — M5136 Other intervertebral disc degeneration, lumbar region: Secondary | ICD-10-CM | POA: Diagnosis not present

## 2019-03-27 DIAGNOSIS — M47812 Spondylosis without myelopathy or radiculopathy, cervical region: Secondary | ICD-10-CM | POA: Diagnosis not present

## 2019-03-27 DIAGNOSIS — M5412 Radiculopathy, cervical region: Secondary | ICD-10-CM | POA: Diagnosis not present

## 2019-04-03 DIAGNOSIS — M1611 Unilateral primary osteoarthritis, right hip: Secondary | ICD-10-CM | POA: Diagnosis not present

## 2019-04-23 DIAGNOSIS — E785 Hyperlipidemia, unspecified: Secondary | ICD-10-CM | POA: Diagnosis not present

## 2019-04-23 DIAGNOSIS — N183 Chronic kidney disease, stage 3 unspecified: Secondary | ICD-10-CM | POA: Diagnosis not present

## 2019-05-07 DIAGNOSIS — E559 Vitamin D deficiency, unspecified: Secondary | ICD-10-CM | POA: Diagnosis not present

## 2019-05-07 DIAGNOSIS — M79609 Pain in unspecified limb: Secondary | ICD-10-CM | POA: Diagnosis not present

## 2019-05-07 DIAGNOSIS — R52 Pain, unspecified: Secondary | ICD-10-CM | POA: Diagnosis not present

## 2019-05-07 DIAGNOSIS — Z01818 Encounter for other preprocedural examination: Secondary | ICD-10-CM | POA: Diagnosis not present

## 2019-05-07 DIAGNOSIS — Z79899 Other long term (current) drug therapy: Secondary | ICD-10-CM | POA: Diagnosis not present

## 2019-05-07 DIAGNOSIS — J9811 Atelectasis: Secondary | ICD-10-CM | POA: Diagnosis not present

## 2019-05-08 DIAGNOSIS — R002 Palpitations: Secondary | ICD-10-CM | POA: Diagnosis not present

## 2019-05-08 DIAGNOSIS — E785 Hyperlipidemia, unspecified: Secondary | ICD-10-CM | POA: Diagnosis not present

## 2019-05-08 DIAGNOSIS — I10 Essential (primary) hypertension: Secondary | ICD-10-CM | POA: Diagnosis not present

## 2019-05-11 DIAGNOSIS — Z01818 Encounter for other preprocedural examination: Secondary | ICD-10-CM | POA: Diagnosis not present

## 2019-05-11 DIAGNOSIS — E785 Hyperlipidemia, unspecified: Secondary | ICD-10-CM | POA: Diagnosis not present

## 2019-05-11 DIAGNOSIS — M159 Polyosteoarthritis, unspecified: Secondary | ICD-10-CM | POA: Diagnosis not present

## 2019-05-11 DIAGNOSIS — S72001S Fracture of unspecified part of neck of right femur, sequela: Secondary | ICD-10-CM | POA: Diagnosis not present

## 2019-05-18 DIAGNOSIS — N390 Urinary tract infection, site not specified: Secondary | ICD-10-CM | POA: Diagnosis not present

## 2019-05-20 DIAGNOSIS — R2689 Other abnormalities of gait and mobility: Secondary | ICD-10-CM | POA: Diagnosis not present

## 2019-05-20 DIAGNOSIS — M25651 Stiffness of right hip, not elsewhere classified: Secondary | ICD-10-CM | POA: Diagnosis not present

## 2019-05-21 DIAGNOSIS — Z1159 Encounter for screening for other viral diseases: Secondary | ICD-10-CM | POA: Diagnosis not present

## 2019-05-23 DIAGNOSIS — E785 Hyperlipidemia, unspecified: Secondary | ICD-10-CM | POA: Diagnosis not present

## 2019-05-23 DIAGNOSIS — I10 Essential (primary) hypertension: Secondary | ICD-10-CM | POA: Diagnosis not present

## 2019-05-23 DIAGNOSIS — N183 Chronic kidney disease, stage 3 unspecified: Secondary | ICD-10-CM | POA: Diagnosis not present

## 2019-05-27 DIAGNOSIS — Z96641 Presence of right artificial hip joint: Secondary | ICD-10-CM | POA: Diagnosis not present

## 2019-05-27 DIAGNOSIS — E669 Obesity, unspecified: Secondary | ICD-10-CM | POA: Diagnosis not present

## 2019-05-27 DIAGNOSIS — R911 Solitary pulmonary nodule: Secondary | ICD-10-CM | POA: Diagnosis not present

## 2019-05-27 DIAGNOSIS — Z8781 Personal history of (healed) traumatic fracture: Secondary | ICD-10-CM | POA: Diagnosis not present

## 2019-05-27 DIAGNOSIS — M7061 Trochanteric bursitis, right hip: Secondary | ICD-10-CM | POA: Diagnosis not present

## 2019-05-27 DIAGNOSIS — E785 Hyperlipidemia, unspecified: Secondary | ICD-10-CM | POA: Diagnosis not present

## 2019-05-27 DIAGNOSIS — K449 Diaphragmatic hernia without obstruction or gangrene: Secondary | ICD-10-CM | POA: Diagnosis not present

## 2019-05-27 DIAGNOSIS — M1611 Unilateral primary osteoarthritis, right hip: Secondary | ICD-10-CM | POA: Diagnosis not present

## 2019-05-27 DIAGNOSIS — Z881 Allergy status to other antibiotic agents status: Secondary | ICD-10-CM | POA: Diagnosis not present

## 2019-05-27 DIAGNOSIS — Z471 Aftercare following joint replacement surgery: Secondary | ICD-10-CM | POA: Diagnosis not present

## 2019-05-27 DIAGNOSIS — I10 Essential (primary) hypertension: Secondary | ICD-10-CM | POA: Diagnosis not present

## 2019-05-27 DIAGNOSIS — M84651A Pathological fracture in other disease, right femur, initial encounter for fracture: Secondary | ICD-10-CM | POA: Diagnosis not present

## 2019-05-27 DIAGNOSIS — N329 Bladder disorder, unspecified: Secondary | ICD-10-CM | POA: Diagnosis not present

## 2019-06-21 DIAGNOSIS — I10 Essential (primary) hypertension: Secondary | ICD-10-CM | POA: Diagnosis not present

## 2019-06-21 DIAGNOSIS — E7800 Pure hypercholesterolemia, unspecified: Secondary | ICD-10-CM | POA: Diagnosis not present

## 2019-06-21 DIAGNOSIS — J309 Allergic rhinitis, unspecified: Secondary | ICD-10-CM | POA: Diagnosis not present

## 2019-07-21 DIAGNOSIS — Z96641 Presence of right artificial hip joint: Secondary | ICD-10-CM | POA: Diagnosis not present

## 2019-07-22 DIAGNOSIS — I10 Essential (primary) hypertension: Secondary | ICD-10-CM | POA: Diagnosis not present

## 2019-07-22 DIAGNOSIS — N183 Chronic kidney disease, stage 3 unspecified: Secondary | ICD-10-CM | POA: Diagnosis not present

## 2019-07-22 DIAGNOSIS — Z23 Encounter for immunization: Secondary | ICD-10-CM | POA: Diagnosis not present

## 2019-07-22 DIAGNOSIS — E785 Hyperlipidemia, unspecified: Secondary | ICD-10-CM | POA: Diagnosis not present

## 2019-08-18 DIAGNOSIS — N3289 Other specified disorders of bladder: Secondary | ICD-10-CM | POA: Diagnosis not present

## 2019-08-18 DIAGNOSIS — N3090 Cystitis, unspecified without hematuria: Secondary | ICD-10-CM | POA: Diagnosis not present

## 2019-08-19 DIAGNOSIS — Z23 Encounter for immunization: Secondary | ICD-10-CM | POA: Diagnosis not present

## 2019-08-21 DIAGNOSIS — E7800 Pure hypercholesterolemia, unspecified: Secondary | ICD-10-CM | POA: Diagnosis not present

## 2019-08-21 DIAGNOSIS — I10 Essential (primary) hypertension: Secondary | ICD-10-CM | POA: Diagnosis not present

## 2019-08-21 DIAGNOSIS — J209 Acute bronchitis, unspecified: Secondary | ICD-10-CM | POA: Diagnosis not present

## 2019-08-21 DIAGNOSIS — J069 Acute upper respiratory infection, unspecified: Secondary | ICD-10-CM | POA: Diagnosis not present

## 2019-08-21 DIAGNOSIS — Z20828 Contact with and (suspected) exposure to other viral communicable diseases: Secondary | ICD-10-CM | POA: Diagnosis not present

## 2019-08-24 DIAGNOSIS — H26491 Other secondary cataract, right eye: Secondary | ICD-10-CM | POA: Diagnosis not present

## 2019-10-21 DIAGNOSIS — E785 Hyperlipidemia, unspecified: Secondary | ICD-10-CM | POA: Diagnosis not present

## 2019-10-21 DIAGNOSIS — N183 Chronic kidney disease, stage 3 unspecified: Secondary | ICD-10-CM | POA: Diagnosis not present

## 2019-11-21 DIAGNOSIS — E785 Hyperlipidemia, unspecified: Secondary | ICD-10-CM | POA: Diagnosis not present

## 2019-11-21 DIAGNOSIS — N183 Chronic kidney disease, stage 3 unspecified: Secondary | ICD-10-CM | POA: Diagnosis not present

## 2019-12-14 DIAGNOSIS — Z01818 Encounter for other preprocedural examination: Secondary | ICD-10-CM | POA: Diagnosis not present

## 2019-12-14 DIAGNOSIS — R1310 Dysphagia, unspecified: Secondary | ICD-10-CM | POA: Diagnosis not present

## 2019-12-22 DIAGNOSIS — N3090 Cystitis, unspecified without hematuria: Secondary | ICD-10-CM | POA: Diagnosis not present

## 2019-12-22 DIAGNOSIS — E785 Hyperlipidemia, unspecified: Secondary | ICD-10-CM | POA: Diagnosis not present

## 2019-12-22 DIAGNOSIS — N3289 Other specified disorders of bladder: Secondary | ICD-10-CM | POA: Diagnosis not present

## 2019-12-22 DIAGNOSIS — N183 Chronic kidney disease, stage 3 unspecified: Secondary | ICD-10-CM | POA: Diagnosis not present

## 2019-12-25 DIAGNOSIS — Z20828 Contact with and (suspected) exposure to other viral communicable diseases: Secondary | ICD-10-CM | POA: Diagnosis not present

## 2019-12-25 DIAGNOSIS — Z1159 Encounter for screening for other viral diseases: Secondary | ICD-10-CM | POA: Diagnosis not present

## 2019-12-25 DIAGNOSIS — R05 Cough: Secondary | ICD-10-CM | POA: Diagnosis not present

## 2020-01-01 DIAGNOSIS — D126 Benign neoplasm of colon, unspecified: Secondary | ICD-10-CM | POA: Diagnosis not present

## 2020-01-01 DIAGNOSIS — K644 Residual hemorrhoidal skin tags: Secondary | ICD-10-CM | POA: Diagnosis not present

## 2020-01-01 DIAGNOSIS — K5730 Diverticulosis of large intestine without perforation or abscess without bleeding: Secondary | ICD-10-CM | POA: Diagnosis not present

## 2020-01-01 DIAGNOSIS — K635 Polyp of colon: Secondary | ICD-10-CM | POA: Diagnosis not present

## 2020-01-01 DIAGNOSIS — R1013 Epigastric pain: Secondary | ICD-10-CM | POA: Diagnosis not present

## 2020-01-01 DIAGNOSIS — D124 Benign neoplasm of descending colon: Secondary | ICD-10-CM | POA: Diagnosis not present

## 2020-01-01 DIAGNOSIS — K449 Diaphragmatic hernia without obstruction or gangrene: Secondary | ICD-10-CM | POA: Diagnosis not present

## 2020-01-01 DIAGNOSIS — I10 Essential (primary) hypertension: Secondary | ICD-10-CM | POA: Diagnosis not present

## 2020-01-01 DIAGNOSIS — D122 Benign neoplasm of ascending colon: Secondary | ICD-10-CM | POA: Diagnosis not present

## 2020-01-01 DIAGNOSIS — K2900 Acute gastritis without bleeding: Secondary | ICD-10-CM | POA: Diagnosis not present

## 2020-01-01 DIAGNOSIS — K2970 Gastritis, unspecified, without bleeding: Secondary | ICD-10-CM | POA: Diagnosis not present

## 2020-01-01 DIAGNOSIS — Z79899 Other long term (current) drug therapy: Secondary | ICD-10-CM | POA: Diagnosis not present

## 2020-01-01 DIAGNOSIS — Z1211 Encounter for screening for malignant neoplasm of colon: Secondary | ICD-10-CM | POA: Diagnosis not present

## 2020-01-01 DIAGNOSIS — R1310 Dysphagia, unspecified: Secondary | ICD-10-CM | POA: Diagnosis not present

## 2020-01-01 DIAGNOSIS — Z7982 Long term (current) use of aspirin: Secondary | ICD-10-CM | POA: Diagnosis not present

## 2020-01-01 DIAGNOSIS — K222 Esophageal obstruction: Secondary | ICD-10-CM | POA: Diagnosis not present

## 2020-01-01 DIAGNOSIS — R110 Nausea: Secondary | ICD-10-CM | POA: Diagnosis not present

## 2020-01-21 DIAGNOSIS — N183 Chronic kidney disease, stage 3 unspecified: Secondary | ICD-10-CM | POA: Diagnosis not present

## 2020-01-21 DIAGNOSIS — E785 Hyperlipidemia, unspecified: Secondary | ICD-10-CM | POA: Diagnosis not present

## 2020-01-26 DIAGNOSIS — I10 Essential (primary) hypertension: Secondary | ICD-10-CM | POA: Diagnosis not present

## 2020-01-26 DIAGNOSIS — Z23 Encounter for immunization: Secondary | ICD-10-CM | POA: Diagnosis not present

## 2020-01-26 DIAGNOSIS — R7302 Impaired glucose tolerance (oral): Secondary | ICD-10-CM | POA: Diagnosis not present

## 2020-01-26 DIAGNOSIS — Z0000 Encounter for general adult medical examination without abnormal findings: Secondary | ICD-10-CM | POA: Diagnosis not present

## 2020-01-26 DIAGNOSIS — E785 Hyperlipidemia, unspecified: Secondary | ICD-10-CM | POA: Diagnosis not present

## 2020-01-26 DIAGNOSIS — Z79899 Other long term (current) drug therapy: Secondary | ICD-10-CM | POA: Diagnosis not present

## 2020-01-26 DIAGNOSIS — I872 Venous insufficiency (chronic) (peripheral): Secondary | ICD-10-CM | POA: Diagnosis not present

## 2020-01-26 DIAGNOSIS — N1832 Chronic kidney disease, stage 3b: Secondary | ICD-10-CM | POA: Diagnosis not present

## 2020-02-20 DIAGNOSIS — N183 Chronic kidney disease, stage 3 unspecified: Secondary | ICD-10-CM | POA: Diagnosis not present

## 2020-02-20 DIAGNOSIS — E785 Hyperlipidemia, unspecified: Secondary | ICD-10-CM | POA: Diagnosis not present

## 2020-03-22 DIAGNOSIS — E785 Hyperlipidemia, unspecified: Secondary | ICD-10-CM | POA: Diagnosis not present

## 2020-03-22 DIAGNOSIS — N183 Chronic kidney disease, stage 3 unspecified: Secondary | ICD-10-CM | POA: Diagnosis not present

## 2020-04-08 DIAGNOSIS — J329 Chronic sinusitis, unspecified: Secondary | ICD-10-CM | POA: Diagnosis not present

## 2020-04-08 DIAGNOSIS — J3089 Other allergic rhinitis: Secondary | ICD-10-CM | POA: Diagnosis not present

## 2020-04-08 DIAGNOSIS — Z20828 Contact with and (suspected) exposure to other viral communicable diseases: Secondary | ICD-10-CM | POA: Diagnosis not present

## 2020-04-08 DIAGNOSIS — J40 Bronchitis, not specified as acute or chronic: Secondary | ICD-10-CM | POA: Diagnosis not present

## 2020-04-21 DIAGNOSIS — I10 Essential (primary) hypertension: Secondary | ICD-10-CM | POA: Diagnosis not present

## 2020-04-21 DIAGNOSIS — E785 Hyperlipidemia, unspecified: Secondary | ICD-10-CM | POA: Diagnosis not present

## 2020-04-29 DIAGNOSIS — Z1231 Encounter for screening mammogram for malignant neoplasm of breast: Secondary | ICD-10-CM | POA: Diagnosis not present

## 2020-05-11 DIAGNOSIS — E785 Hyperlipidemia, unspecified: Secondary | ICD-10-CM | POA: Diagnosis not present

## 2020-05-11 DIAGNOSIS — I10 Essential (primary) hypertension: Secondary | ICD-10-CM | POA: Diagnosis not present

## 2020-05-11 DIAGNOSIS — R002 Palpitations: Secondary | ICD-10-CM | POA: Diagnosis not present

## 2020-05-11 DIAGNOSIS — R011 Cardiac murmur, unspecified: Secondary | ICD-10-CM | POA: Diagnosis not present

## 2020-05-22 DIAGNOSIS — N183 Chronic kidney disease, stage 3 unspecified: Secondary | ICD-10-CM | POA: Diagnosis not present

## 2020-05-22 DIAGNOSIS — E785 Hyperlipidemia, unspecified: Secondary | ICD-10-CM | POA: Diagnosis not present

## 2020-05-25 DIAGNOSIS — N390 Urinary tract infection, site not specified: Secondary | ICD-10-CM | POA: Diagnosis not present

## 2020-05-25 DIAGNOSIS — N3090 Cystitis, unspecified without hematuria: Secondary | ICD-10-CM | POA: Diagnosis not present

## 2020-05-25 DIAGNOSIS — N3289 Other specified disorders of bladder: Secondary | ICD-10-CM | POA: Diagnosis not present

## 2020-05-26 DIAGNOSIS — Z96641 Presence of right artificial hip joint: Secondary | ICD-10-CM | POA: Diagnosis not present

## 2020-05-26 DIAGNOSIS — M1611 Unilateral primary osteoarthritis, right hip: Secondary | ICD-10-CM | POA: Diagnosis not present

## 2020-05-31 DIAGNOSIS — R011 Cardiac murmur, unspecified: Secondary | ICD-10-CM | POA: Diagnosis not present

## 2020-06-20 DIAGNOSIS — E785 Hyperlipidemia, unspecified: Secondary | ICD-10-CM | POA: Diagnosis not present

## 2020-06-20 DIAGNOSIS — N183 Chronic kidney disease, stage 3 unspecified: Secondary | ICD-10-CM | POA: Diagnosis not present

## 2020-07-12 DIAGNOSIS — K5900 Constipation, unspecified: Secondary | ICD-10-CM | POA: Diagnosis not present

## 2020-07-12 DIAGNOSIS — N390 Urinary tract infection, site not specified: Secondary | ICD-10-CM | POA: Diagnosis not present

## 2020-07-12 DIAGNOSIS — N3289 Other specified disorders of bladder: Secondary | ICD-10-CM | POA: Diagnosis not present

## 2020-07-20 DIAGNOSIS — N183 Chronic kidney disease, stage 3 unspecified: Secondary | ICD-10-CM | POA: Diagnosis not present

## 2020-07-20 DIAGNOSIS — E785 Hyperlipidemia, unspecified: Secondary | ICD-10-CM | POA: Diagnosis not present

## 2020-08-05 DIAGNOSIS — I700 Atherosclerosis of aorta: Secondary | ICD-10-CM | POA: Diagnosis not present

## 2020-08-05 DIAGNOSIS — Z79899 Other long term (current) drug therapy: Secondary | ICD-10-CM | POA: Diagnosis not present

## 2020-08-05 DIAGNOSIS — Z9049 Acquired absence of other specified parts of digestive tract: Secondary | ICD-10-CM | POA: Diagnosis not present

## 2020-08-05 DIAGNOSIS — K449 Diaphragmatic hernia without obstruction or gangrene: Secondary | ICD-10-CM | POA: Diagnosis not present

## 2020-08-05 DIAGNOSIS — Z7982 Long term (current) use of aspirin: Secondary | ICD-10-CM | POA: Diagnosis not present

## 2020-08-05 DIAGNOSIS — I10 Essential (primary) hypertension: Secondary | ICD-10-CM | POA: Diagnosis not present

## 2020-08-05 DIAGNOSIS — K4090 Unilateral inguinal hernia, without obstruction or gangrene, not specified as recurrent: Secondary | ICD-10-CM | POA: Diagnosis not present

## 2020-08-05 DIAGNOSIS — S81812A Laceration without foreign body, left lower leg, initial encounter: Secondary | ICD-10-CM | POA: Diagnosis not present

## 2020-08-05 DIAGNOSIS — N3000 Acute cystitis without hematuria: Secondary | ICD-10-CM | POA: Diagnosis not present

## 2020-08-05 DIAGNOSIS — R1032 Left lower quadrant pain: Secondary | ICD-10-CM | POA: Diagnosis not present

## 2020-08-05 DIAGNOSIS — B9689 Other specified bacterial agents as the cause of diseases classified elsewhere: Secondary | ICD-10-CM | POA: Diagnosis not present

## 2020-08-09 DIAGNOSIS — S81812D Laceration without foreign body, left lower leg, subsequent encounter: Secondary | ICD-10-CM | POA: Diagnosis not present

## 2020-08-09 DIAGNOSIS — N1832 Chronic kidney disease, stage 3b: Secondary | ICD-10-CM | POA: Diagnosis not present

## 2020-08-09 DIAGNOSIS — M7062 Trochanteric bursitis, left hip: Secondary | ICD-10-CM | POA: Diagnosis not present

## 2020-08-09 DIAGNOSIS — N3000 Acute cystitis without hematuria: Secondary | ICD-10-CM | POA: Diagnosis not present

## 2020-08-20 DIAGNOSIS — E1142 Type 2 diabetes mellitus with diabetic polyneuropathy: Secondary | ICD-10-CM | POA: Diagnosis not present

## 2020-08-20 DIAGNOSIS — N183 Chronic kidney disease, stage 3 unspecified: Secondary | ICD-10-CM | POA: Diagnosis not present

## 2020-08-20 DIAGNOSIS — I10 Essential (primary) hypertension: Secondary | ICD-10-CM | POA: Diagnosis not present

## 2020-08-20 DIAGNOSIS — E785 Hyperlipidemia, unspecified: Secondary | ICD-10-CM | POA: Diagnosis not present

## 2020-08-24 DIAGNOSIS — N3289 Other specified disorders of bladder: Secondary | ICD-10-CM | POA: Diagnosis not present

## 2020-08-24 DIAGNOSIS — N390 Urinary tract infection, site not specified: Secondary | ICD-10-CM | POA: Diagnosis not present

## 2020-08-24 DIAGNOSIS — R1032 Left lower quadrant pain: Secondary | ICD-10-CM | POA: Diagnosis not present

## 2020-08-26 DIAGNOSIS — I872 Venous insufficiency (chronic) (peripheral): Secondary | ICD-10-CM | POA: Diagnosis not present

## 2020-08-26 DIAGNOSIS — L97222 Non-pressure chronic ulcer of left calf with fat layer exposed: Secondary | ICD-10-CM | POA: Diagnosis not present

## 2020-09-02 DIAGNOSIS — I872 Venous insufficiency (chronic) (peripheral): Secondary | ICD-10-CM | POA: Diagnosis not present

## 2020-09-02 DIAGNOSIS — L97222 Non-pressure chronic ulcer of left calf with fat layer exposed: Secondary | ICD-10-CM | POA: Diagnosis not present

## 2020-09-05 DIAGNOSIS — N390 Urinary tract infection, site not specified: Secondary | ICD-10-CM | POA: Diagnosis not present

## 2020-09-08 DIAGNOSIS — N390 Urinary tract infection, site not specified: Secondary | ICD-10-CM | POA: Diagnosis not present

## 2020-09-09 DIAGNOSIS — H26491 Other secondary cataract, right eye: Secondary | ICD-10-CM | POA: Diagnosis not present

## 2020-09-12 DIAGNOSIS — L97822 Non-pressure chronic ulcer of other part of left lower leg with fat layer exposed: Secondary | ICD-10-CM | POA: Diagnosis not present

## 2020-09-12 DIAGNOSIS — I872 Venous insufficiency (chronic) (peripheral): Secondary | ICD-10-CM | POA: Diagnosis not present

## 2020-09-12 DIAGNOSIS — L97222 Non-pressure chronic ulcer of left calf with fat layer exposed: Secondary | ICD-10-CM | POA: Diagnosis not present

## 2020-09-19 DIAGNOSIS — N183 Chronic kidney disease, stage 3 unspecified: Secondary | ICD-10-CM | POA: Diagnosis not present

## 2020-09-19 DIAGNOSIS — E785 Hyperlipidemia, unspecified: Secondary | ICD-10-CM | POA: Diagnosis not present

## 2020-09-26 DIAGNOSIS — L97222 Non-pressure chronic ulcer of left calf with fat layer exposed: Secondary | ICD-10-CM | POA: Diagnosis not present

## 2020-09-26 DIAGNOSIS — L97822 Non-pressure chronic ulcer of other part of left lower leg with fat layer exposed: Secondary | ICD-10-CM | POA: Diagnosis not present

## 2020-09-26 DIAGNOSIS — I872 Venous insufficiency (chronic) (peripheral): Secondary | ICD-10-CM | POA: Diagnosis not present

## 2020-09-27 DIAGNOSIS — N390 Urinary tract infection, site not specified: Secondary | ICD-10-CM | POA: Diagnosis not present

## 2020-10-05 DIAGNOSIS — K5900 Constipation, unspecified: Secondary | ICD-10-CM | POA: Diagnosis not present

## 2020-10-05 DIAGNOSIS — N3289 Other specified disorders of bladder: Secondary | ICD-10-CM | POA: Diagnosis not present

## 2020-10-05 DIAGNOSIS — R339 Retention of urine, unspecified: Secondary | ICD-10-CM | POA: Diagnosis not present

## 2020-10-05 DIAGNOSIS — N390 Urinary tract infection, site not specified: Secondary | ICD-10-CM | POA: Diagnosis not present

## 2020-10-10 DIAGNOSIS — L97822 Non-pressure chronic ulcer of other part of left lower leg with fat layer exposed: Secondary | ICD-10-CM | POA: Diagnosis not present

## 2020-10-10 DIAGNOSIS — I872 Venous insufficiency (chronic) (peripheral): Secondary | ICD-10-CM | POA: Diagnosis not present

## 2020-10-10 DIAGNOSIS — I83022 Varicose veins of left lower extremity with ulcer of calf: Secondary | ICD-10-CM | POA: Diagnosis not present

## 2020-10-10 DIAGNOSIS — L97222 Non-pressure chronic ulcer of left calf with fat layer exposed: Secondary | ICD-10-CM | POA: Diagnosis not present

## 2020-10-20 DIAGNOSIS — M1830 Unilateral post-traumatic osteoarthritis of first carpometacarpal joint, unspecified hand: Secondary | ICD-10-CM | POA: Diagnosis not present

## 2020-10-20 DIAGNOSIS — E785 Hyperlipidemia, unspecified: Secondary | ICD-10-CM | POA: Diagnosis not present

## 2020-10-25 DIAGNOSIS — I872 Venous insufficiency (chronic) (peripheral): Secondary | ICD-10-CM | POA: Diagnosis not present

## 2020-10-25 DIAGNOSIS — L97222 Non-pressure chronic ulcer of left calf with fat layer exposed: Secondary | ICD-10-CM | POA: Diagnosis not present

## 2020-11-07 DIAGNOSIS — I872 Venous insufficiency (chronic) (peripheral): Secondary | ICD-10-CM | POA: Diagnosis not present

## 2020-11-07 DIAGNOSIS — L97222 Non-pressure chronic ulcer of left calf with fat layer exposed: Secondary | ICD-10-CM | POA: Diagnosis not present

## 2020-11-14 DIAGNOSIS — N390 Urinary tract infection, site not specified: Secondary | ICD-10-CM | POA: Diagnosis not present

## 2020-11-20 DIAGNOSIS — E785 Hyperlipidemia, unspecified: Secondary | ICD-10-CM | POA: Diagnosis not present

## 2020-11-20 DIAGNOSIS — N183 Chronic kidney disease, stage 3 unspecified: Secondary | ICD-10-CM | POA: Diagnosis not present

## 2020-11-21 DIAGNOSIS — I878 Other specified disorders of veins: Secondary | ICD-10-CM | POA: Diagnosis not present

## 2020-11-21 DIAGNOSIS — L97222 Non-pressure chronic ulcer of left calf with fat layer exposed: Secondary | ICD-10-CM | POA: Diagnosis not present

## 2020-11-21 DIAGNOSIS — I872 Venous insufficiency (chronic) (peripheral): Secondary | ICD-10-CM | POA: Diagnosis not present

## 2020-12-21 DIAGNOSIS — N183 Chronic kidney disease, stage 3 unspecified: Secondary | ICD-10-CM | POA: Diagnosis not present

## 2020-12-21 DIAGNOSIS — E785 Hyperlipidemia, unspecified: Secondary | ICD-10-CM | POA: Diagnosis not present

## 2020-12-27 DIAGNOSIS — J329 Chronic sinusitis, unspecified: Secondary | ICD-10-CM | POA: Diagnosis not present

## 2020-12-27 DIAGNOSIS — N390 Urinary tract infection, site not specified: Secondary | ICD-10-CM | POA: Diagnosis not present

## 2020-12-27 DIAGNOSIS — H6123 Impacted cerumen, bilateral: Secondary | ICD-10-CM | POA: Diagnosis not present

## 2020-12-27 DIAGNOSIS — K5900 Constipation, unspecified: Secondary | ICD-10-CM | POA: Diagnosis not present

## 2020-12-27 DIAGNOSIS — J309 Allergic rhinitis, unspecified: Secondary | ICD-10-CM | POA: Diagnosis not present

## 2020-12-27 DIAGNOSIS — Z20828 Contact with and (suspected) exposure to other viral communicable diseases: Secondary | ICD-10-CM | POA: Diagnosis not present

## 2020-12-27 DIAGNOSIS — J40 Bronchitis, not specified as acute or chronic: Secondary | ICD-10-CM | POA: Diagnosis not present

## 2020-12-27 DIAGNOSIS — N3289 Other specified disorders of bladder: Secondary | ICD-10-CM | POA: Diagnosis not present

## 2020-12-27 DIAGNOSIS — R339 Retention of urine, unspecified: Secondary | ICD-10-CM | POA: Diagnosis not present

## 2021-01-16 DIAGNOSIS — M67911 Unspecified disorder of synovium and tendon, right shoulder: Secondary | ICD-10-CM | POA: Diagnosis not present

## 2021-01-20 DIAGNOSIS — E785 Hyperlipidemia, unspecified: Secondary | ICD-10-CM | POA: Diagnosis not present

## 2021-01-20 DIAGNOSIS — I10 Essential (primary) hypertension: Secondary | ICD-10-CM | POA: Diagnosis not present

## 2021-02-20 DIAGNOSIS — I10 Essential (primary) hypertension: Secondary | ICD-10-CM | POA: Diagnosis not present

## 2021-02-20 DIAGNOSIS — E785 Hyperlipidemia, unspecified: Secondary | ICD-10-CM | POA: Diagnosis not present

## 2021-02-22 DIAGNOSIS — J329 Chronic sinusitis, unspecified: Secondary | ICD-10-CM | POA: Diagnosis not present

## 2021-02-22 DIAGNOSIS — Z20828 Contact with and (suspected) exposure to other viral communicable diseases: Secondary | ICD-10-CM | POA: Diagnosis not present

## 2021-02-22 DIAGNOSIS — J3489 Other specified disorders of nose and nasal sinuses: Secondary | ICD-10-CM | POA: Diagnosis not present

## 2021-02-22 DIAGNOSIS — J40 Bronchitis, not specified as acute or chronic: Secondary | ICD-10-CM | POA: Diagnosis not present

## 2021-03-09 DIAGNOSIS — Z23 Encounter for immunization: Secondary | ICD-10-CM | POA: Diagnosis not present

## 2021-03-13 DIAGNOSIS — M7552 Bursitis of left shoulder: Secondary | ICD-10-CM | POA: Diagnosis not present

## 2021-03-13 DIAGNOSIS — M25512 Pain in left shoulder: Secondary | ICD-10-CM | POA: Diagnosis not present

## 2021-03-22 DIAGNOSIS — I10 Essential (primary) hypertension: Secondary | ICD-10-CM | POA: Diagnosis not present

## 2021-03-22 DIAGNOSIS — E785 Hyperlipidemia, unspecified: Secondary | ICD-10-CM | POA: Diagnosis not present

## 2021-03-27 DIAGNOSIS — J329 Chronic sinusitis, unspecified: Secondary | ICD-10-CM | POA: Diagnosis not present

## 2021-03-27 DIAGNOSIS — Z6830 Body mass index (BMI) 30.0-30.9, adult: Secondary | ICD-10-CM | POA: Diagnosis not present

## 2021-03-27 DIAGNOSIS — J40 Bronchitis, not specified as acute or chronic: Secondary | ICD-10-CM | POA: Diagnosis not present

## 2021-03-31 DIAGNOSIS — N390 Urinary tract infection, site not specified: Secondary | ICD-10-CM | POA: Diagnosis not present

## 2021-03-31 DIAGNOSIS — R339 Retention of urine, unspecified: Secondary | ICD-10-CM | POA: Diagnosis not present

## 2021-03-31 DIAGNOSIS — N3289 Other specified disorders of bladder: Secondary | ICD-10-CM | POA: Diagnosis not present

## 2021-04-09 DIAGNOSIS — I10 Essential (primary) hypertension: Secondary | ICD-10-CM | POA: Diagnosis not present

## 2021-04-09 DIAGNOSIS — I491 Atrial premature depolarization: Secondary | ICD-10-CM | POA: Diagnosis not present

## 2021-04-09 DIAGNOSIS — I517 Cardiomegaly: Secondary | ICD-10-CM | POA: Diagnosis not present

## 2021-04-09 DIAGNOSIS — J9811 Atelectasis: Secondary | ICD-10-CM | POA: Diagnosis not present

## 2021-04-09 DIAGNOSIS — Z79899 Other long term (current) drug therapy: Secondary | ICD-10-CM | POA: Diagnosis not present

## 2021-04-09 DIAGNOSIS — R0789 Other chest pain: Secondary | ICD-10-CM | POA: Diagnosis not present

## 2021-04-09 DIAGNOSIS — I119 Hypertensive heart disease without heart failure: Secondary | ICD-10-CM | POA: Diagnosis not present

## 2021-04-09 DIAGNOSIS — Z8249 Family history of ischemic heart disease and other diseases of the circulatory system: Secondary | ICD-10-CM | POA: Diagnosis not present

## 2021-04-09 DIAGNOSIS — Z23 Encounter for immunization: Secondary | ICD-10-CM | POA: Diagnosis not present

## 2021-04-09 DIAGNOSIS — I252 Old myocardial infarction: Secondary | ICD-10-CM | POA: Diagnosis not present

## 2021-04-09 DIAGNOSIS — R072 Precordial pain: Secondary | ICD-10-CM | POA: Diagnosis not present

## 2021-04-09 DIAGNOSIS — R918 Other nonspecific abnormal finding of lung field: Secondary | ICD-10-CM | POA: Diagnosis not present

## 2021-04-09 DIAGNOSIS — R079 Chest pain, unspecified: Secondary | ICD-10-CM | POA: Diagnosis not present

## 2021-04-10 DIAGNOSIS — I491 Atrial premature depolarization: Secondary | ICD-10-CM | POA: Diagnosis not present

## 2021-04-10 DIAGNOSIS — R0789 Other chest pain: Secondary | ICD-10-CM | POA: Diagnosis not present

## 2021-04-10 DIAGNOSIS — R072 Precordial pain: Secondary | ICD-10-CM | POA: Diagnosis not present

## 2021-04-10 DIAGNOSIS — I10 Essential (primary) hypertension: Secondary | ICD-10-CM | POA: Diagnosis not present

## 2021-04-20 DIAGNOSIS — Z6830 Body mass index (BMI) 30.0-30.9, adult: Secondary | ICD-10-CM | POA: Diagnosis not present

## 2021-04-20 DIAGNOSIS — J189 Pneumonia, unspecified organism: Secondary | ICD-10-CM | POA: Diagnosis not present

## 2021-04-21 DIAGNOSIS — N1832 Chronic kidney disease, stage 3b: Secondary | ICD-10-CM | POA: Diagnosis not present

## 2021-04-21 DIAGNOSIS — I10 Essential (primary) hypertension: Secondary | ICD-10-CM | POA: Diagnosis not present

## 2021-04-21 DIAGNOSIS — E785 Hyperlipidemia, unspecified: Secondary | ICD-10-CM | POA: Diagnosis not present

## 2021-04-27 DIAGNOSIS — R531 Weakness: Secondary | ICD-10-CM | POA: Diagnosis not present

## 2021-04-27 DIAGNOSIS — R110 Nausea: Secondary | ICD-10-CM | POA: Diagnosis not present

## 2021-04-27 DIAGNOSIS — R319 Hematuria, unspecified: Secondary | ICD-10-CM | POA: Diagnosis not present

## 2021-04-27 DIAGNOSIS — Z6829 Body mass index (BMI) 29.0-29.9, adult: Secondary | ICD-10-CM | POA: Diagnosis not present

## 2021-05-01 DIAGNOSIS — G720 Drug-induced myopathy: Secondary | ICD-10-CM | POA: Diagnosis not present

## 2021-05-01 DIAGNOSIS — I10 Essential (primary) hypertension: Secondary | ICD-10-CM | POA: Diagnosis not present

## 2021-05-01 DIAGNOSIS — M25511 Pain in right shoulder: Secondary | ICD-10-CM | POA: Diagnosis not present

## 2021-05-01 DIAGNOSIS — G8929 Other chronic pain: Secondary | ICD-10-CM | POA: Diagnosis not present

## 2021-05-01 DIAGNOSIS — R7302 Impaired glucose tolerance (oral): Secondary | ICD-10-CM | POA: Diagnosis not present

## 2021-05-01 DIAGNOSIS — Z0000 Encounter for general adult medical examination without abnormal findings: Secondary | ICD-10-CM | POA: Diagnosis not present

## 2021-05-01 DIAGNOSIS — Z79899 Other long term (current) drug therapy: Secondary | ICD-10-CM | POA: Diagnosis not present

## 2021-05-01 DIAGNOSIS — R911 Solitary pulmonary nodule: Secondary | ICD-10-CM | POA: Diagnosis not present

## 2021-05-01 DIAGNOSIS — T466X5A Adverse effect of antihyperlipidemic and antiarteriosclerotic drugs, initial encounter: Secondary | ICD-10-CM | POA: Diagnosis not present

## 2021-05-01 DIAGNOSIS — E785 Hyperlipidemia, unspecified: Secondary | ICD-10-CM | POA: Diagnosis not present

## 2021-05-09 DIAGNOSIS — S46011A Strain of muscle(s) and tendon(s) of the rotator cuff of right shoulder, initial encounter: Secondary | ICD-10-CM | POA: Diagnosis not present

## 2021-05-09 DIAGNOSIS — M19011 Primary osteoarthritis, right shoulder: Secondary | ICD-10-CM | POA: Diagnosis not present

## 2021-05-09 DIAGNOSIS — X58XXXA Exposure to other specified factors, initial encounter: Secondary | ICD-10-CM | POA: Diagnosis not present

## 2021-05-11 DIAGNOSIS — R531 Weakness: Secondary | ICD-10-CM | POA: Diagnosis not present

## 2021-05-11 DIAGNOSIS — I6381 Other cerebral infarction due to occlusion or stenosis of small artery: Secondary | ICD-10-CM | POA: Diagnosis not present

## 2021-05-11 DIAGNOSIS — R918 Other nonspecific abnormal finding of lung field: Secondary | ICD-10-CM | POA: Diagnosis not present

## 2021-05-11 DIAGNOSIS — R911 Solitary pulmonary nodule: Secondary | ICD-10-CM | POA: Diagnosis not present

## 2021-05-11 DIAGNOSIS — I700 Atherosclerosis of aorta: Secondary | ICD-10-CM | POA: Diagnosis not present

## 2021-05-11 DIAGNOSIS — M6281 Muscle weakness (generalized): Secondary | ICD-10-CM | POA: Diagnosis not present

## 2021-05-11 DIAGNOSIS — R930 Abnormal findings on diagnostic imaging of skull and head, not elsewhere classified: Secondary | ICD-10-CM | POA: Diagnosis not present

## 2021-05-11 DIAGNOSIS — J320 Chronic maxillary sinusitis: Secondary | ICD-10-CM | POA: Diagnosis not present

## 2021-05-12 DIAGNOSIS — R002 Palpitations: Secondary | ICD-10-CM | POA: Diagnosis not present

## 2021-05-12 DIAGNOSIS — I358 Other nonrheumatic aortic valve disorders: Secondary | ICD-10-CM

## 2021-05-12 DIAGNOSIS — R0789 Other chest pain: Secondary | ICD-10-CM

## 2021-05-12 DIAGNOSIS — I10 Essential (primary) hypertension: Secondary | ICD-10-CM | POA: Diagnosis not present

## 2021-05-12 DIAGNOSIS — E785 Hyperlipidemia, unspecified: Secondary | ICD-10-CM | POA: Diagnosis not present

## 2021-05-12 HISTORY — DX: Other chest pain: R07.89

## 2021-05-12 HISTORY — DX: Other nonrheumatic aortic valve disorders: I35.8

## 2021-05-15 DIAGNOSIS — G8929 Other chronic pain: Secondary | ICD-10-CM | POA: Diagnosis not present

## 2021-05-15 DIAGNOSIS — M25511 Pain in right shoulder: Secondary | ICD-10-CM | POA: Diagnosis not present

## 2021-05-19 DIAGNOSIS — I672 Cerebral atherosclerosis: Secondary | ICD-10-CM | POA: Diagnosis not present

## 2021-05-19 DIAGNOSIS — J3089 Other allergic rhinitis: Secondary | ICD-10-CM | POA: Diagnosis not present

## 2021-05-19 DIAGNOSIS — R911 Solitary pulmonary nodule: Secondary | ICD-10-CM | POA: Diagnosis not present

## 2021-05-19 DIAGNOSIS — S76011A Strain of muscle, fascia and tendon of right hip, initial encounter: Secondary | ICD-10-CM | POA: Diagnosis not present

## 2021-05-22 DIAGNOSIS — L820 Inflamed seborrheic keratosis: Secondary | ICD-10-CM | POA: Diagnosis not present

## 2021-05-22 DIAGNOSIS — C44329 Squamous cell carcinoma of skin of other parts of face: Secondary | ICD-10-CM | POA: Diagnosis not present

## 2021-05-22 DIAGNOSIS — L570 Actinic keratosis: Secondary | ICD-10-CM | POA: Diagnosis not present

## 2021-05-22 DIAGNOSIS — L821 Other seborrheic keratosis: Secondary | ICD-10-CM | POA: Diagnosis not present

## 2021-05-22 DIAGNOSIS — C4442 Squamous cell carcinoma of skin of scalp and neck: Secondary | ICD-10-CM | POA: Diagnosis not present

## 2021-05-23 DIAGNOSIS — I10 Essential (primary) hypertension: Secondary | ICD-10-CM | POA: Diagnosis not present

## 2021-05-23 DIAGNOSIS — N1832 Chronic kidney disease, stage 3b: Secondary | ICD-10-CM | POA: Diagnosis not present

## 2021-05-23 DIAGNOSIS — E785 Hyperlipidemia, unspecified: Secondary | ICD-10-CM | POA: Diagnosis not present

## 2021-06-05 DIAGNOSIS — C4442 Squamous cell carcinoma of skin of scalp and neck: Secondary | ICD-10-CM | POA: Diagnosis not present

## 2021-06-20 DIAGNOSIS — N1832 Chronic kidney disease, stage 3b: Secondary | ICD-10-CM | POA: Diagnosis not present

## 2021-06-20 DIAGNOSIS — I10 Essential (primary) hypertension: Secondary | ICD-10-CM | POA: Diagnosis not present

## 2021-06-20 DIAGNOSIS — E785 Hyperlipidemia, unspecified: Secondary | ICD-10-CM | POA: Diagnosis not present

## 2021-07-21 DIAGNOSIS — I10 Essential (primary) hypertension: Secondary | ICD-10-CM | POA: Diagnosis not present

## 2021-07-21 DIAGNOSIS — E785 Hyperlipidemia, unspecified: Secondary | ICD-10-CM | POA: Diagnosis not present

## 2021-08-04 DIAGNOSIS — R339 Retention of urine, unspecified: Secondary | ICD-10-CM | POA: Diagnosis not present

## 2021-08-04 DIAGNOSIS — N3289 Other specified disorders of bladder: Secondary | ICD-10-CM | POA: Diagnosis not present

## 2021-08-04 DIAGNOSIS — N390 Urinary tract infection, site not specified: Secondary | ICD-10-CM | POA: Diagnosis not present

## 2021-08-15 DIAGNOSIS — L578 Other skin changes due to chronic exposure to nonionizing radiation: Secondary | ICD-10-CM | POA: Diagnosis not present

## 2021-08-15 DIAGNOSIS — C44622 Squamous cell carcinoma of skin of right upper limb, including shoulder: Secondary | ICD-10-CM | POA: Diagnosis not present

## 2021-08-15 DIAGNOSIS — L821 Other seborrheic keratosis: Secondary | ICD-10-CM | POA: Diagnosis not present

## 2021-08-15 DIAGNOSIS — L570 Actinic keratosis: Secondary | ICD-10-CM | POA: Diagnosis not present

## 2021-08-18 DIAGNOSIS — I358 Other nonrheumatic aortic valve disorders: Secondary | ICD-10-CM | POA: Diagnosis not present

## 2021-08-18 DIAGNOSIS — E663 Overweight: Secondary | ICD-10-CM | POA: Diagnosis not present

## 2021-08-18 DIAGNOSIS — R0602 Shortness of breath: Secondary | ICD-10-CM | POA: Diagnosis not present

## 2021-08-18 DIAGNOSIS — I10 Essential (primary) hypertension: Secondary | ICD-10-CM | POA: Diagnosis not present

## 2021-08-24 DIAGNOSIS — R059 Cough, unspecified: Secondary | ICD-10-CM | POA: Diagnosis not present

## 2021-08-24 DIAGNOSIS — Z6829 Body mass index (BMI) 29.0-29.9, adult: Secondary | ICD-10-CM | POA: Diagnosis not present

## 2021-08-24 DIAGNOSIS — I358 Other nonrheumatic aortic valve disorders: Secondary | ICD-10-CM | POA: Diagnosis not present

## 2021-09-05 DIAGNOSIS — R0602 Shortness of breath: Secondary | ICD-10-CM | POA: Diagnosis not present

## 2021-09-05 DIAGNOSIS — I340 Nonrheumatic mitral (valve) insufficiency: Secondary | ICD-10-CM | POA: Diagnosis not present

## 2021-09-20 DIAGNOSIS — E785 Hyperlipidemia, unspecified: Secondary | ICD-10-CM | POA: Diagnosis not present

## 2021-09-20 DIAGNOSIS — I10 Essential (primary) hypertension: Secondary | ICD-10-CM | POA: Diagnosis not present

## 2021-09-25 DIAGNOSIS — H353131 Nonexudative age-related macular degeneration, bilateral, early dry stage: Secondary | ICD-10-CM | POA: Diagnosis not present

## 2021-10-05 DIAGNOSIS — M79672 Pain in left foot: Secondary | ICD-10-CM | POA: Diagnosis not present

## 2021-10-20 DIAGNOSIS — I10 Essential (primary) hypertension: Secondary | ICD-10-CM | POA: Diagnosis not present

## 2021-10-20 DIAGNOSIS — E785 Hyperlipidemia, unspecified: Secondary | ICD-10-CM | POA: Diagnosis not present

## 2021-10-23 DIAGNOSIS — M10072 Idiopathic gout, left ankle and foot: Secondary | ICD-10-CM | POA: Diagnosis not present

## 2021-10-23 DIAGNOSIS — M2559 Pain in other specified joint: Secondary | ICD-10-CM | POA: Diagnosis not present

## 2021-11-01 DIAGNOSIS — M1A0720 Idiopathic chronic gout, left ankle and foot, without tophus (tophi): Secondary | ICD-10-CM | POA: Diagnosis not present

## 2021-11-01 DIAGNOSIS — B029 Zoster without complications: Secondary | ICD-10-CM | POA: Diagnosis not present

## 2021-11-03 DIAGNOSIS — H26491 Other secondary cataract, right eye: Secondary | ICD-10-CM | POA: Diagnosis not present

## 2021-11-13 DIAGNOSIS — I358 Other nonrheumatic aortic valve disorders: Secondary | ICD-10-CM | POA: Diagnosis not present

## 2021-11-13 DIAGNOSIS — I10 Essential (primary) hypertension: Secondary | ICD-10-CM | POA: Diagnosis not present

## 2021-11-13 DIAGNOSIS — R5381 Other malaise: Secondary | ICD-10-CM | POA: Diagnosis not present

## 2021-11-13 DIAGNOSIS — E785 Hyperlipidemia, unspecified: Secondary | ICD-10-CM | POA: Diagnosis not present

## 2021-11-13 DIAGNOSIS — R5383 Other fatigue: Secondary | ICD-10-CM | POA: Diagnosis not present

## 2021-11-15 DIAGNOSIS — N390 Urinary tract infection, site not specified: Secondary | ICD-10-CM | POA: Diagnosis not present

## 2021-11-16 DIAGNOSIS — C44622 Squamous cell carcinoma of skin of right upper limb, including shoulder: Secondary | ICD-10-CM | POA: Diagnosis not present

## 2021-11-16 DIAGNOSIS — L570 Actinic keratosis: Secondary | ICD-10-CM | POA: Diagnosis not present

## 2021-11-16 DIAGNOSIS — L821 Other seborrheic keratosis: Secondary | ICD-10-CM | POA: Diagnosis not present

## 2021-11-20 ENCOUNTER — Encounter: Payer: Self-pay | Admitting: Pulmonary Disease

## 2021-11-20 ENCOUNTER — Ambulatory Visit (INDEPENDENT_AMBULATORY_CARE_PROVIDER_SITE_OTHER): Payer: Medicare Other | Admitting: Pulmonary Disease

## 2021-11-20 DIAGNOSIS — G4733 Obstructive sleep apnea (adult) (pediatric): Secondary | ICD-10-CM | POA: Diagnosis not present

## 2021-11-20 DIAGNOSIS — R053 Chronic cough: Secondary | ICD-10-CM

## 2021-11-20 HISTORY — DX: Chronic cough: R05.3

## 2021-11-20 NOTE — Assessment & Plan Note (Signed)
This may be related to postnasal drip or reflux.  She is significantly improved and she relates this to starting omeprazole.  At this point I have asked her to continue taking this for another few weeks and then decrease to once a day and finally off and use as needed. For rhinitis , trial of Flonase 1 spray each nare at bedtime.  If postnasal drip continues she can take OTC chlorpheniramine/decongestant combination

## 2021-11-20 NOTE — Assessment & Plan Note (Signed)
Mild and no further work-up required

## 2021-11-20 NOTE — Patient Instructions (Addendum)
  Cough may be related to reflux. Continue omeprazole twice daily  For rhinitis , trial of Flonase 1 spray each nare at bedtime

## 2021-11-20 NOTE — Progress Notes (Signed)
Subjective:    Patient ID: Tina Curry, female    DOB: 1940-07-20, 81 y.o.   MRN: [id number]  HPI   81 year old never smoker from [place] presents for evaluation of cough. She was last seen by me in 2014 when she presented with right upper lobe nodule, bronchoscopy was nondiagnostic and this remained stable for 3 years. She reports chronic cough that started in January with thick mucus production.  She was treated with antibiotics. Ramipril was changed to losartan.  She then underwent chest x-ray in May, I reviewed both these films which show no infiltrate or effusion.  She was treated for "indigestion" with omeprazole twice daily and cough has now decreased and improved by 75%. She reports seasonal allergies in spring and fall. She has been maintained on Singulair Levocetirizine was stopped because this made her dizzy.  She reports occasional reflux symptoms.  She reports persistent sinus drip.  She denies wheezing and has never been diagnosed with asthma  Her husband accompanies and corroborates history   Significant tests/ events reviewed   2014 CT chest CT shows stable branching RUL nodule, stable since 2011   Ct chest in 3/20 11 ([facility]) showed 2.7 x 2.1 cm RUl spiculated ill defined nodular infiltrate .She has been on nitrofurantoin since 2009 after bladder tack surgery.   Spirometry showed FVC of 70%, no airway obstruction.  Pet scan 06/2009 >> low SUV 3.0 uptake. Pulm arteries & heart appear enlarged.  FU chest CT jan'12 shows more prominent RUL nodule with surrounding smaller nodules ? infectious  05/2010 bronchoscopy >> cytolgy neg, cx neg, afb / fungal neg  05/07/2011 back on nitrofurantoin   PSG 10/12 showed mild OSa with [facility] 9/h, predominant hypopneas  Past Medical History:  Diagnosis Date   Chronic bronchitis    Other and unspecified hyperlipidemia    Unspecified essential hypertension     Past surgical history-cholecystectomy, back surgery, hysterectomy, tubal  ligation   Allergies  Allergen Reactions   Clarithromycin     REACTION: metalic taste in mouth   Iodine     REACTION: rash,itch   Levofloxacin     Multiple side effects   Povidone-Iodine     REACTION: "turned red"    Social History   Socioeconomic History   Marital status: Married    Spouse name: Not on file   Number of children: Not on file   Years of education: Not on file   Highest education level: Not on file  Occupational History   Occupation: housewife    Employer: RETIRED  Tobacco Use   Smoking status: Never    Passive exposure: Past   Smokeless tobacco: Former    Types: Snuff  Substance and Sexual Activity   Alcohol use: No   Drug use: No   Sexual activity: Not on file  Other Topics Concern   Not on file  Social History Narrative   Not on file   Social Determinants of Health   Financial Resource Strain: Not on file  Food Insecurity: Not on file  Transportation Needs: Not on file  Physical Activity: Not on file  Stress: Not on file  Social Connections: Not on file  Intimate Partner Violence: Not on file    Family History  Problem Relation Age of Onset   Heart attack Mother     Review of Systems neg for any significant sore throat, dysphagia, itching, sneezing, nasal congestion or excess/ purulent secretions, fever, chills, sweats, unintended wt loss, pleuritic or exertional cp, hempoptysis, orthopnea  pnd or change in chronic leg swelling. Also denies presyncope, palpitations, heartburn, abdominal pain, nausea, vomiting, diarrhea or change in bowel or urinary habits, dysuria,hematuria, rash, arthralgias, visual complaints, headache, numbness weakness or ataxia.     Objective:   Physical Exam  Gen. Pleasant, obese, in no distress ENT - no lesions, no post nasal drip Neck: No JVD, no thyromegaly, no carotid bruits Lungs: no use of accessory muscles, no dullness to percussion, decreased without rales or rhonchi  Cardiovascular: Rhythm regular,  heart sounds  normal, no murmurs or gallops, no peripheral edema Musculoskeletal: No deformities, no cyanosis or clubbing , no tremors       Assessment & Plan:

## 2021-11-29 DIAGNOSIS — N390 Urinary tract infection, site not specified: Secondary | ICD-10-CM | POA: Diagnosis not present

## 2021-11-30 DIAGNOSIS — I358 Other nonrheumatic aortic valve disorders: Secondary | ICD-10-CM | POA: Diagnosis not present

## 2021-12-04 DIAGNOSIS — N3289 Other specified disorders of bladder: Secondary | ICD-10-CM | POA: Diagnosis not present

## 2021-12-04 DIAGNOSIS — R339 Retention of urine, unspecified: Secondary | ICD-10-CM | POA: Diagnosis not present

## 2021-12-04 DIAGNOSIS — N390 Urinary tract infection, site not specified: Secondary | ICD-10-CM | POA: Diagnosis not present

## 2021-12-11 DIAGNOSIS — K2960 Other gastritis without bleeding: Secondary | ICD-10-CM | POA: Diagnosis not present

## 2021-12-11 DIAGNOSIS — M1A0720 Idiopathic chronic gout, left ankle and foot, without tophus (tophi): Secondary | ICD-10-CM | POA: Diagnosis not present

## 2021-12-11 DIAGNOSIS — R7302 Impaired glucose tolerance (oral): Secondary | ICD-10-CM | POA: Diagnosis not present

## 2021-12-11 DIAGNOSIS — E559 Vitamin D deficiency, unspecified: Secondary | ICD-10-CM | POA: Diagnosis not present

## 2021-12-19 DIAGNOSIS — E785 Hyperlipidemia, unspecified: Secondary | ICD-10-CM | POA: Diagnosis not present

## 2021-12-19 DIAGNOSIS — M62830 Muscle spasm of back: Secondary | ICD-10-CM | POA: Diagnosis not present

## 2021-12-19 DIAGNOSIS — N1832 Chronic kidney disease, stage 3b: Secondary | ICD-10-CM | POA: Diagnosis not present

## 2021-12-19 DIAGNOSIS — M1A0720 Idiopathic chronic gout, left ankle and foot, without tophus (tophi): Secondary | ICD-10-CM | POA: Diagnosis not present

## 2022-02-19 DIAGNOSIS — L821 Other seborrheic keratosis: Secondary | ICD-10-CM | POA: Diagnosis not present

## 2022-02-19 DIAGNOSIS — L570 Actinic keratosis: Secondary | ICD-10-CM | POA: Diagnosis not present

## 2022-02-19 DIAGNOSIS — C44622 Squamous cell carcinoma of skin of right upper limb, including shoulder: Secondary | ICD-10-CM | POA: Diagnosis not present

## 2022-02-19 DIAGNOSIS — L578 Other skin changes due to chronic exposure to nonionizing radiation: Secondary | ICD-10-CM | POA: Diagnosis not present

## 2022-04-09 DIAGNOSIS — N390 Urinary tract infection, site not specified: Secondary | ICD-10-CM | POA: Diagnosis not present

## 2022-04-09 DIAGNOSIS — N3289 Other specified disorders of bladder: Secondary | ICD-10-CM | POA: Diagnosis not present

## 2022-04-09 DIAGNOSIS — R339 Retention of urine, unspecified: Secondary | ICD-10-CM | POA: Diagnosis not present

## 2022-04-19 DIAGNOSIS — Z79899 Other long term (current) drug therapy: Secondary | ICD-10-CM | POA: Diagnosis not present

## 2022-04-19 DIAGNOSIS — E559 Vitamin D deficiency, unspecified: Secondary | ICD-10-CM | POA: Diagnosis not present

## 2022-04-19 DIAGNOSIS — R531 Weakness: Secondary | ICD-10-CM | POA: Diagnosis not present

## 2022-04-19 DIAGNOSIS — Z6829 Body mass index (BMI) 29.0-29.9, adult: Secondary | ICD-10-CM | POA: Diagnosis not present

## 2022-04-19 DIAGNOSIS — Z23 Encounter for immunization: Secondary | ICD-10-CM | POA: Diagnosis not present

## 2022-04-19 DIAGNOSIS — E785 Hyperlipidemia, unspecified: Secondary | ICD-10-CM | POA: Diagnosis not present

## 2022-04-19 DIAGNOSIS — N1832 Chronic kidney disease, stage 3b: Secondary | ICD-10-CM | POA: Diagnosis not present

## 2022-04-21 DIAGNOSIS — R509 Fever, unspecified: Secondary | ICD-10-CM | POA: Diagnosis not present

## 2022-04-21 DIAGNOSIS — J324 Chronic pansinusitis: Secondary | ICD-10-CM | POA: Diagnosis not present

## 2022-04-21 DIAGNOSIS — R051 Acute cough: Secondary | ICD-10-CM | POA: Diagnosis not present

## 2022-04-22 DIAGNOSIS — I10 Essential (primary) hypertension: Secondary | ICD-10-CM | POA: Diagnosis not present

## 2022-04-22 DIAGNOSIS — E785 Hyperlipidemia, unspecified: Secondary | ICD-10-CM | POA: Diagnosis not present

## 2022-04-26 DIAGNOSIS — N1832 Chronic kidney disease, stage 3b: Secondary | ICD-10-CM | POA: Diagnosis not present

## 2022-05-03 DIAGNOSIS — E876 Hypokalemia: Secondary | ICD-10-CM | POA: Diagnosis not present

## 2022-05-03 DIAGNOSIS — J40 Bronchitis, not specified as acute or chronic: Secondary | ICD-10-CM | POA: Diagnosis not present

## 2022-05-03 DIAGNOSIS — J329 Chronic sinusitis, unspecified: Secondary | ICD-10-CM | POA: Diagnosis not present

## 2022-05-14 DIAGNOSIS — E876 Hypokalemia: Secondary | ICD-10-CM | POA: Diagnosis not present

## 2022-05-14 DIAGNOSIS — T466X5A Adverse effect of antihyperlipidemic and antiarteriosclerotic drugs, initial encounter: Secondary | ICD-10-CM | POA: Diagnosis not present

## 2022-05-14 DIAGNOSIS — R7302 Impaired glucose tolerance (oral): Secondary | ICD-10-CM | POA: Diagnosis not present

## 2022-05-14 DIAGNOSIS — N1832 Chronic kidney disease, stage 3b: Secondary | ICD-10-CM | POA: Diagnosis not present

## 2022-05-14 DIAGNOSIS — Z0000 Encounter for general adult medical examination without abnormal findings: Secondary | ICD-10-CM | POA: Diagnosis not present

## 2022-05-14 DIAGNOSIS — E785 Hyperlipidemia, unspecified: Secondary | ICD-10-CM | POA: Diagnosis not present

## 2022-05-14 DIAGNOSIS — M1991 Primary osteoarthritis, unspecified site: Secondary | ICD-10-CM | POA: Diagnosis not present

## 2022-05-14 DIAGNOSIS — M8580 Other specified disorders of bone density and structure, unspecified site: Secondary | ICD-10-CM | POA: Diagnosis not present

## 2022-05-14 DIAGNOSIS — M1A0720 Idiopathic chronic gout, left ankle and foot, without tophus (tophi): Secondary | ICD-10-CM | POA: Diagnosis not present

## 2022-05-14 DIAGNOSIS — Z79899 Other long term (current) drug therapy: Secondary | ICD-10-CM | POA: Diagnosis not present

## 2022-05-14 DIAGNOSIS — G720 Drug-induced myopathy: Secondary | ICD-10-CM | POA: Diagnosis not present

## 2022-05-14 DIAGNOSIS — E559 Vitamin D deficiency, unspecified: Secondary | ICD-10-CM | POA: Diagnosis not present

## 2022-05-14 DIAGNOSIS — I10 Essential (primary) hypertension: Secondary | ICD-10-CM | POA: Diagnosis not present

## 2022-05-18 DIAGNOSIS — M62838 Other muscle spasm: Secondary | ICD-10-CM | POA: Diagnosis not present

## 2022-05-18 DIAGNOSIS — I10 Essential (primary) hypertension: Secondary | ICD-10-CM | POA: Diagnosis not present

## 2022-05-18 DIAGNOSIS — R002 Palpitations: Secondary | ICD-10-CM | POA: Diagnosis not present

## 2022-05-18 DIAGNOSIS — E785 Hyperlipidemia, unspecified: Secondary | ICD-10-CM | POA: Diagnosis not present

## 2022-05-18 DIAGNOSIS — I358 Other nonrheumatic aortic valve disorders: Secondary | ICD-10-CM | POA: Diagnosis not present

## 2022-05-21 DIAGNOSIS — H35372 Puckering of macula, left eye: Secondary | ICD-10-CM | POA: Diagnosis not present

## 2022-06-21 DIAGNOSIS — L821 Other seborrheic keratosis: Secondary | ICD-10-CM | POA: Diagnosis not present

## 2022-06-21 DIAGNOSIS — L570 Actinic keratosis: Secondary | ICD-10-CM | POA: Diagnosis not present

## 2022-06-21 DIAGNOSIS — C44622 Squamous cell carcinoma of skin of right upper limb, including shoulder: Secondary | ICD-10-CM | POA: Diagnosis not present

## 2022-06-21 DIAGNOSIS — L578 Other skin changes due to chronic exposure to nonionizing radiation: Secondary | ICD-10-CM | POA: Diagnosis not present

## 2022-07-03 DIAGNOSIS — N390 Urinary tract infection, site not specified: Secondary | ICD-10-CM | POA: Diagnosis not present

## 2022-07-10 DIAGNOSIS — N390 Urinary tract infection, site not specified: Secondary | ICD-10-CM | POA: Diagnosis not present

## 2022-07-10 DIAGNOSIS — N3289 Other specified disorders of bladder: Secondary | ICD-10-CM | POA: Diagnosis not present

## 2022-07-10 DIAGNOSIS — R339 Retention of urine, unspecified: Secondary | ICD-10-CM | POA: Diagnosis not present

## 2022-09-04 DIAGNOSIS — Z96651 Presence of right artificial knee joint: Secondary | ICD-10-CM | POA: Diagnosis not present

## 2022-09-24 DIAGNOSIS — Z96651 Presence of right artificial knee joint: Secondary | ICD-10-CM | POA: Diagnosis not present

## 2022-09-24 DIAGNOSIS — T8484XA Pain due to internal orthopedic prosthetic devices, implants and grafts, initial encounter: Secondary | ICD-10-CM | POA: Diagnosis not present

## 2022-10-04 DIAGNOSIS — E785 Hyperlipidemia, unspecified: Secondary | ICD-10-CM | POA: Diagnosis not present

## 2022-10-04 DIAGNOSIS — I10 Essential (primary) hypertension: Secondary | ICD-10-CM | POA: Diagnosis not present

## 2022-10-04 DIAGNOSIS — I358 Other nonrheumatic aortic valve disorders: Secondary | ICD-10-CM | POA: Diagnosis not present

## 2022-10-04 DIAGNOSIS — R0609 Other forms of dyspnea: Secondary | ICD-10-CM | POA: Diagnosis not present

## 2022-10-04 DIAGNOSIS — R079 Chest pain, unspecified: Secondary | ICD-10-CM | POA: Diagnosis not present

## 2022-10-04 DIAGNOSIS — R0789 Other chest pain: Secondary | ICD-10-CM | POA: Diagnosis not present

## 2022-10-11 DIAGNOSIS — R339 Retention of urine, unspecified: Secondary | ICD-10-CM | POA: Diagnosis not present

## 2022-10-11 DIAGNOSIS — N3289 Other specified disorders of bladder: Secondary | ICD-10-CM | POA: Diagnosis not present

## 2022-10-11 DIAGNOSIS — N390 Urinary tract infection, site not specified: Secondary | ICD-10-CM | POA: Diagnosis not present

## 2022-10-15 DIAGNOSIS — K5900 Constipation, unspecified: Secondary | ICD-10-CM | POA: Diagnosis not present

## 2022-10-15 DIAGNOSIS — G8929 Other chronic pain: Secondary | ICD-10-CM | POA: Diagnosis not present

## 2022-10-15 DIAGNOSIS — Z9849 Cataract extraction status, unspecified eye: Secondary | ICD-10-CM | POA: Diagnosis not present

## 2022-10-15 DIAGNOSIS — E876 Hypokalemia: Secondary | ICD-10-CM | POA: Diagnosis not present

## 2022-10-15 DIAGNOSIS — J309 Allergic rhinitis, unspecified: Secondary | ICD-10-CM | POA: Diagnosis not present

## 2022-10-15 DIAGNOSIS — E785 Hyperlipidemia, unspecified: Secondary | ICD-10-CM | POA: Diagnosis not present

## 2022-10-15 DIAGNOSIS — I10 Essential (primary) hypertension: Secondary | ICD-10-CM | POA: Diagnosis not present

## 2022-10-15 DIAGNOSIS — E669 Obesity, unspecified: Secondary | ICD-10-CM | POA: Diagnosis not present

## 2022-10-22 DIAGNOSIS — R0789 Other chest pain: Secondary | ICD-10-CM | POA: Diagnosis not present

## 2022-10-22 DIAGNOSIS — R0609 Other forms of dyspnea: Secondary | ICD-10-CM | POA: Diagnosis not present

## 2022-10-31 DIAGNOSIS — N390 Urinary tract infection, site not specified: Secondary | ICD-10-CM | POA: Diagnosis not present

## 2022-10-31 DIAGNOSIS — R0609 Other forms of dyspnea: Secondary | ICD-10-CM | POA: Diagnosis not present

## 2022-10-31 DIAGNOSIS — I080 Rheumatic disorders of both mitral and aortic valves: Secondary | ICD-10-CM | POA: Diagnosis not present

## 2022-10-31 DIAGNOSIS — I2510 Atherosclerotic heart disease of native coronary artery without angina pectoris: Secondary | ICD-10-CM | POA: Diagnosis not present

## 2022-11-24 DIAGNOSIS — R069 Unspecified abnormalities of breathing: Secondary | ICD-10-CM | POA: Diagnosis not present

## 2022-12-17 DIAGNOSIS — E663 Overweight: Secondary | ICD-10-CM | POA: Diagnosis not present

## 2022-12-17 DIAGNOSIS — T7840XS Allergy, unspecified, sequela: Secondary | ICD-10-CM | POA: Diagnosis not present

## 2022-12-17 DIAGNOSIS — T783XXS Angioneurotic edema, sequela: Secondary | ICD-10-CM | POA: Diagnosis not present

## 2022-12-17 DIAGNOSIS — Z6829 Body mass index (BMI) 29.0-29.9, adult: Secondary | ICD-10-CM | POA: Diagnosis not present

## 2022-12-17 DIAGNOSIS — I10 Essential (primary) hypertension: Secondary | ICD-10-CM | POA: Diagnosis not present

## 2022-12-31 DIAGNOSIS — L570 Actinic keratosis: Secondary | ICD-10-CM | POA: Diagnosis not present

## 2022-12-31 DIAGNOSIS — L578 Other skin changes due to chronic exposure to nonionizing radiation: Secondary | ICD-10-CM | POA: Diagnosis not present

## 2022-12-31 DIAGNOSIS — H35372 Puckering of macula, left eye: Secondary | ICD-10-CM | POA: Diagnosis not present

## 2022-12-31 DIAGNOSIS — L728 Other follicular cysts of the skin and subcutaneous tissue: Secondary | ICD-10-CM | POA: Diagnosis not present

## 2022-12-31 DIAGNOSIS — D044 Carcinoma in situ of skin of scalp and neck: Secondary | ICD-10-CM | POA: Diagnosis not present

## 2023-01-01 DIAGNOSIS — N390 Urinary tract infection, site not specified: Secondary | ICD-10-CM | POA: Diagnosis not present

## 2023-01-17 DIAGNOSIS — Z6829 Body mass index (BMI) 29.0-29.9, adult: Secondary | ICD-10-CM | POA: Diagnosis not present

## 2023-01-17 DIAGNOSIS — R0989 Other specified symptoms and signs involving the circulatory and respiratory systems: Secondary | ICD-10-CM | POA: Diagnosis not present

## 2023-01-17 DIAGNOSIS — T783XXS Angioneurotic edema, sequela: Secondary | ICD-10-CM | POA: Diagnosis not present

## 2023-01-17 DIAGNOSIS — E663 Overweight: Secondary | ICD-10-CM | POA: Diagnosis not present

## 2023-01-17 DIAGNOSIS — I10 Essential (primary) hypertension: Secondary | ICD-10-CM | POA: Diagnosis not present

## 2023-01-21 DIAGNOSIS — I10 Essential (primary) hypertension: Secondary | ICD-10-CM | POA: Diagnosis not present

## 2023-01-21 DIAGNOSIS — R252 Cramp and spasm: Secondary | ICD-10-CM | POA: Diagnosis not present

## 2023-01-21 DIAGNOSIS — I358 Other nonrheumatic aortic valve disorders: Secondary | ICD-10-CM | POA: Diagnosis not present

## 2023-01-21 DIAGNOSIS — R0789 Other chest pain: Secondary | ICD-10-CM | POA: Diagnosis not present

## 2023-01-21 DIAGNOSIS — E785 Hyperlipidemia, unspecified: Secondary | ICD-10-CM | POA: Diagnosis not present

## 2023-01-24 DIAGNOSIS — J329 Chronic sinusitis, unspecified: Secondary | ICD-10-CM | POA: Diagnosis not present

## 2023-01-24 DIAGNOSIS — Z6829 Body mass index (BMI) 29.0-29.9, adult: Secondary | ICD-10-CM | POA: Diagnosis not present

## 2023-01-24 DIAGNOSIS — J40 Bronchitis, not specified as acute or chronic: Secondary | ICD-10-CM | POA: Diagnosis not present

## 2023-01-24 DIAGNOSIS — R062 Wheezing: Secondary | ICD-10-CM | POA: Diagnosis not present

## 2023-01-24 DIAGNOSIS — Z6828 Body mass index (BMI) 28.0-28.9, adult: Secondary | ICD-10-CM | POA: Diagnosis not present

## 2023-01-27 DIAGNOSIS — I209 Angina pectoris, unspecified: Secondary | ICD-10-CM | POA: Diagnosis not present

## 2023-01-27 DIAGNOSIS — X58XXXA Exposure to other specified factors, initial encounter: Secondary | ICD-10-CM | POA: Diagnosis not present

## 2023-01-27 DIAGNOSIS — L508 Other urticaria: Secondary | ICD-10-CM | POA: Diagnosis not present

## 2023-01-27 DIAGNOSIS — T50905A Adverse effect of unspecified drugs, medicaments and biological substances, initial encounter: Secondary | ICD-10-CM | POA: Diagnosis not present

## 2023-01-27 DIAGNOSIS — R079 Chest pain, unspecified: Secondary | ICD-10-CM | POA: Diagnosis not present

## 2023-01-27 DIAGNOSIS — T7840XA Allergy, unspecified, initial encounter: Secondary | ICD-10-CM | POA: Diagnosis not present

## 2023-01-27 DIAGNOSIS — R21 Rash and other nonspecific skin eruption: Secondary | ICD-10-CM | POA: Diagnosis not present

## 2023-01-27 DIAGNOSIS — R918 Other nonspecific abnormal finding of lung field: Secondary | ICD-10-CM | POA: Diagnosis not present

## 2023-01-27 DIAGNOSIS — I10 Essential (primary) hypertension: Secondary | ICD-10-CM | POA: Diagnosis not present

## 2023-01-27 DIAGNOSIS — J811 Chronic pulmonary edema: Secondary | ICD-10-CM | POA: Diagnosis not present

## 2023-01-27 DIAGNOSIS — R0600 Dyspnea, unspecified: Secondary | ICD-10-CM | POA: Diagnosis not present

## 2023-01-29 DIAGNOSIS — T7840XD Allergy, unspecified, subsequent encounter: Secondary | ICD-10-CM | POA: Diagnosis not present

## 2023-01-29 DIAGNOSIS — Z889 Allergy status to unspecified drugs, medicaments and biological substances status: Secondary | ICD-10-CM | POA: Diagnosis not present

## 2023-01-29 DIAGNOSIS — Z6829 Body mass index (BMI) 29.0-29.9, adult: Secondary | ICD-10-CM | POA: Diagnosis not present

## 2023-01-29 DIAGNOSIS — T783XXS Angioneurotic edema, sequela: Secondary | ICD-10-CM | POA: Diagnosis not present

## 2023-01-29 DIAGNOSIS — I10 Essential (primary) hypertension: Secondary | ICD-10-CM | POA: Diagnosis not present

## 2023-01-29 DIAGNOSIS — E663 Overweight: Secondary | ICD-10-CM | POA: Diagnosis not present

## 2023-01-30 DIAGNOSIS — R5381 Other malaise: Secondary | ICD-10-CM | POA: Diagnosis not present

## 2023-02-07 DIAGNOSIS — E876 Hypokalemia: Secondary | ICD-10-CM | POA: Diagnosis not present

## 2023-02-07 DIAGNOSIS — Z6829 Body mass index (BMI) 29.0-29.9, adult: Secondary | ICD-10-CM | POA: Diagnosis not present

## 2023-02-07 DIAGNOSIS — E2740 Unspecified adrenocortical insufficiency: Secondary | ICD-10-CM | POA: Diagnosis not present

## 2023-02-07 DIAGNOSIS — E663 Overweight: Secondary | ICD-10-CM | POA: Diagnosis not present

## 2023-02-07 DIAGNOSIS — T783XXS Angioneurotic edema, sequela: Secondary | ICD-10-CM | POA: Diagnosis not present

## 2023-02-07 DIAGNOSIS — T7840XD Allergy, unspecified, subsequent encounter: Secondary | ICD-10-CM | POA: Diagnosis not present

## 2023-02-07 DIAGNOSIS — Z889 Allergy status to unspecified drugs, medicaments and biological substances status: Secondary | ICD-10-CM | POA: Diagnosis not present

## 2023-02-11 DIAGNOSIS — Z881 Allergy status to other antibiotic agents status: Secondary | ICD-10-CM | POA: Diagnosis not present

## 2023-02-11 DIAGNOSIS — M1990 Unspecified osteoarthritis, unspecified site: Secondary | ICD-10-CM | POA: Diagnosis not present

## 2023-02-11 DIAGNOSIS — E7800 Pure hypercholesterolemia, unspecified: Secondary | ICD-10-CM | POA: Diagnosis not present

## 2023-02-11 DIAGNOSIS — Z91041 Radiographic dye allergy status: Secondary | ICD-10-CM | POA: Diagnosis not present

## 2023-02-11 DIAGNOSIS — R0989 Other specified symptoms and signs involving the circulatory and respiratory systems: Secondary | ICD-10-CM | POA: Diagnosis not present

## 2023-02-11 DIAGNOSIS — R918 Other nonspecific abnormal finding of lung field: Secondary | ICD-10-CM | POA: Diagnosis not present

## 2023-02-11 DIAGNOSIS — J9811 Atelectasis: Secondary | ICD-10-CM | POA: Diagnosis not present

## 2023-02-11 DIAGNOSIS — J90 Pleural effusion, not elsewhere classified: Secondary | ICD-10-CM | POA: Diagnosis not present

## 2023-02-11 DIAGNOSIS — F419 Anxiety disorder, unspecified: Secondary | ICD-10-CM | POA: Diagnosis not present

## 2023-02-11 DIAGNOSIS — B9620 Unspecified Escherichia coli [E. coli] as the cause of diseases classified elsewhere: Secondary | ICD-10-CM | POA: Diagnosis not present

## 2023-02-11 DIAGNOSIS — Z87440 Personal history of urinary (tract) infections: Secondary | ICD-10-CM | POA: Diagnosis not present

## 2023-02-11 DIAGNOSIS — Z888 Allergy status to other drugs, medicaments and biological substances status: Secondary | ICD-10-CM | POA: Diagnosis not present

## 2023-02-11 DIAGNOSIS — I4891 Unspecified atrial fibrillation: Secondary | ICD-10-CM | POA: Diagnosis not present

## 2023-02-11 DIAGNOSIS — Z792 Long term (current) use of antibiotics: Secondary | ICD-10-CM | POA: Diagnosis not present

## 2023-02-11 DIAGNOSIS — N3001 Acute cystitis with hematuria: Secondary | ICD-10-CM | POA: Diagnosis not present

## 2023-02-11 DIAGNOSIS — Z7901 Long term (current) use of anticoagulants: Secondary | ICD-10-CM | POA: Diagnosis not present

## 2023-02-11 DIAGNOSIS — R0602 Shortness of breath: Secondary | ICD-10-CM | POA: Diagnosis not present

## 2023-02-11 DIAGNOSIS — Z79899 Other long term (current) drug therapy: Secondary | ICD-10-CM | POA: Diagnosis not present

## 2023-02-11 DIAGNOSIS — K219 Gastro-esophageal reflux disease without esophagitis: Secondary | ICD-10-CM | POA: Diagnosis not present

## 2023-02-11 DIAGNOSIS — D519 Vitamin B12 deficiency anemia, unspecified: Secondary | ICD-10-CM | POA: Diagnosis not present

## 2023-02-11 DIAGNOSIS — Z7982 Long term (current) use of aspirin: Secondary | ICD-10-CM | POA: Diagnosis not present

## 2023-02-11 DIAGNOSIS — Z91013 Allergy to seafood: Secondary | ICD-10-CM | POA: Diagnosis not present

## 2023-02-11 DIAGNOSIS — Z886 Allergy status to analgesic agent status: Secondary | ICD-10-CM | POA: Diagnosis not present

## 2023-02-11 DIAGNOSIS — Z91030 Bee allergy status: Secondary | ICD-10-CM | POA: Diagnosis not present

## 2023-02-11 DIAGNOSIS — I10 Essential (primary) hypertension: Secondary | ICD-10-CM | POA: Diagnosis not present

## 2023-02-11 DIAGNOSIS — N390 Urinary tract infection, site not specified: Secondary | ICD-10-CM | POA: Diagnosis not present

## 2023-02-12 DIAGNOSIS — I4891 Unspecified atrial fibrillation: Secondary | ICD-10-CM | POA: Diagnosis not present

## 2023-02-13 DIAGNOSIS — I4891 Unspecified atrial fibrillation: Secondary | ICD-10-CM | POA: Diagnosis not present

## 2023-02-15 DIAGNOSIS — I358 Other nonrheumatic aortic valve disorders: Secondary | ICD-10-CM | POA: Diagnosis not present

## 2023-02-15 DIAGNOSIS — G8929 Other chronic pain: Secondary | ICD-10-CM | POA: Diagnosis not present

## 2023-02-15 DIAGNOSIS — E7800 Pure hypercholesterolemia, unspecified: Secondary | ICD-10-CM | POA: Diagnosis not present

## 2023-02-15 DIAGNOSIS — K449 Diaphragmatic hernia without obstruction or gangrene: Secondary | ICD-10-CM | POA: Diagnosis not present

## 2023-02-15 DIAGNOSIS — J811 Chronic pulmonary edema: Secondary | ICD-10-CM | POA: Diagnosis not present

## 2023-02-15 DIAGNOSIS — J9811 Atelectasis: Secondary | ICD-10-CM | POA: Diagnosis not present

## 2023-02-15 DIAGNOSIS — B9620 Unspecified Escherichia coli [E. coli] as the cause of diseases classified elsewhere: Secondary | ICD-10-CM | POA: Diagnosis not present

## 2023-02-15 DIAGNOSIS — G720 Drug-induced myopathy: Secondary | ICD-10-CM | POA: Diagnosis not present

## 2023-02-15 DIAGNOSIS — K219 Gastro-esophageal reflux disease without esophagitis: Secondary | ICD-10-CM | POA: Diagnosis not present

## 2023-02-15 DIAGNOSIS — I4892 Unspecified atrial flutter: Secondary | ICD-10-CM | POA: Diagnosis not present

## 2023-02-15 DIAGNOSIS — E663 Overweight: Secondary | ICD-10-CM | POA: Diagnosis not present

## 2023-02-15 DIAGNOSIS — M1991 Primary osteoarthritis, unspecified site: Secondary | ICD-10-CM | POA: Diagnosis not present

## 2023-02-15 DIAGNOSIS — I1310 Hypertensive heart and chronic kidney disease without heart failure, with stage 1 through stage 4 chronic kidney disease, or unspecified chronic kidney disease: Secondary | ICD-10-CM | POA: Diagnosis not present

## 2023-02-15 DIAGNOSIS — I4891 Unspecified atrial fibrillation: Secondary | ICD-10-CM | POA: Diagnosis not present

## 2023-02-15 DIAGNOSIS — N1832 Chronic kidney disease, stage 3b: Secondary | ICD-10-CM | POA: Diagnosis not present

## 2023-02-15 DIAGNOSIS — M8580 Other specified disorders of bone density and structure, unspecified site: Secondary | ICD-10-CM | POA: Diagnosis not present

## 2023-02-15 DIAGNOSIS — I672 Cerebral atherosclerosis: Secondary | ICD-10-CM | POA: Diagnosis not present

## 2023-02-15 DIAGNOSIS — E2740 Unspecified adrenocortical insufficiency: Secondary | ICD-10-CM | POA: Diagnosis not present

## 2023-02-15 DIAGNOSIS — I872 Venous insufficiency (chronic) (peripheral): Secondary | ICD-10-CM | POA: Diagnosis not present

## 2023-02-15 DIAGNOSIS — M1A0720 Idiopathic chronic gout, left ankle and foot, without tophus (tophi): Secondary | ICD-10-CM | POA: Diagnosis not present

## 2023-02-15 DIAGNOSIS — N3001 Acute cystitis with hematuria: Secondary | ICD-10-CM | POA: Diagnosis not present

## 2023-02-15 DIAGNOSIS — D519 Vitamin B12 deficiency anemia, unspecified: Secondary | ICD-10-CM | POA: Diagnosis not present

## 2023-02-15 DIAGNOSIS — R911 Solitary pulmonary nodule: Secondary | ICD-10-CM | POA: Diagnosis not present

## 2023-02-15 DIAGNOSIS — F419 Anxiety disorder, unspecified: Secondary | ICD-10-CM | POA: Diagnosis not present

## 2023-02-20 ENCOUNTER — Other Ambulatory Visit: Payer: Self-pay

## 2023-02-21 ENCOUNTER — Ambulatory Visit: Payer: PPO | Attending: Cardiology | Admitting: Cardiology

## 2023-02-21 ENCOUNTER — Encounter: Payer: Self-pay | Admitting: Cardiology

## 2023-02-21 VITALS — BP 152/82 | HR 83 | Ht 65.0 in | Wt 186.6 lb

## 2023-02-21 DIAGNOSIS — I10 Essential (primary) hypertension: Secondary | ICD-10-CM

## 2023-02-21 DIAGNOSIS — E785 Hyperlipidemia, unspecified: Secondary | ICD-10-CM | POA: Diagnosis not present

## 2023-02-21 DIAGNOSIS — I4891 Unspecified atrial fibrillation: Secondary | ICD-10-CM | POA: Insufficient documentation

## 2023-02-21 DIAGNOSIS — R0609 Other forms of dyspnea: Secondary | ICD-10-CM

## 2023-02-21 DIAGNOSIS — I358 Other nonrheumatic aortic valve disorders: Secondary | ICD-10-CM | POA: Diagnosis not present

## 2023-02-21 DIAGNOSIS — G4733 Obstructive sleep apnea (adult) (pediatric): Secondary | ICD-10-CM | POA: Diagnosis not present

## 2023-02-21 HISTORY — DX: Unspecified atrial fibrillation: I48.91

## 2023-02-21 MED ORDER — METOPROLOL TARTRATE 25 MG PO TABS: ORAL_TABLET | ORAL | 0 refills | Status: DC

## 2023-02-21 MED ORDER — METOLAZONE 2.5 MG PO TABS: 2.5000 mg | ORAL_TABLET | Freq: Every day | ORAL | 3 refills | Status: DC

## 2023-02-21 MED ORDER — PREDNISONE 50 MG PO TABS: ORAL_TABLET | ORAL | 0 refills | Status: DC

## 2023-02-21 NOTE — Progress Notes (Signed)
Cardiology Office Note:    Date:  02/21/2023   ID:  Tina Curry, DOB Dec 12, 1940, MRN [id number]  PCP:  Street, Stephanie Coup, MD  Cardiologist:  Garwin Brothers, MD   Referring MD: Desmond Dike, MD    ASSESSMENT:    1. Essential hypertension   2. Aortic valve sclerosis   3. OSA (obstructive sleep apnea)   4. Dyslipidemia   5. Atrial fibrillation, unspecified type (HCC)   6. DOE (dyspnea on exertion)    PLAN:    In order of problems listed above:  Primary prevention stressed with the patient.  Importance of compliance with diet medication stressed and patient verbalized standing. Dyspnea on exertion: This is complex and concerning.  I reviewed records from the other cardiology group at the hospital admission.  My strategy to improve her symptoms would be to change her diuretic at this point.  I would start Zaroxolyn instead of furosemide.  This will be for the next few days.  I also want to rule out ischemic substrate as a cause of her symptoms.  She has multiple risk factors for coronary artery disease.  Will get her a CT scan on our new scan which can scan patients with atrial fibrillation with well-controlled ventricular rates.  If she has obstructive coronary artery disease we will address this.  If not we will pursue that for rate control and rhythm control.  Hopefully by that time she would have received adequate anticoagulation.  She has not missed a dose of Eliquis in the past several days.  She promises not to miss her dose.Marland Kitchen  She will be seen in the next 1 to 2 weeks and follow-up primary. Newly diagnosed atrial fibrillation:I discussed with the patient atrial fibrillation, disease process. Management and therapy including rate and rhythm control, anticoagulation benefits and potential risks were discussed extensively with the patient. Patient had multiple questions which were answered to patient's satisfaction. Essential hypertension: She is going to keep a track of her blood  pressures at home and get a log for me when she comes to see me again.  This will be in the next few days. Patient had multiple questions which were answered to her satisfaction.   Medication Adjustments/Labs and Tests Ordered: Current medicines are reviewed at length with the patient today.  Concerns regarding medicines are outlined above.  Orders Placed This Encounter  Procedures   CT CORONARY MORPH W/CTA COR W/SCORE W/CA W/CM &/OR WO/CM   Basic metabolic panel   EKG 12-Lead   Meds ordered this encounter  Medications   metolazone (ZAROXOLYN) 2.5 MG tablet    Sig: Take 1 tablet (2.5 mg total) by mouth daily.    Dispense:  90 tablet    Refill:  3   metoprolol tartrate (LOPRESSOR) 25 MG tablet    Sig: Take 25 mg (1 tablet) Lopressor 2 hours prior to your CT scan for a heart rate greater than 55. This is in addition to your 100 mg dose.    Dispense:  1 tablet    Refill:  0     Chief Complaint  Patient presents with   Hospitalization Follow-up    Seen at [facility] for fluid retention Dx with CHF     History of Present Illness:    Tina Curry is a 82 y.o. female.  Patient has past medical history of newly diagnosed atrial fibrillation, essential hypertension, congestive heart failure and dyslipidemia.  She is a pleasant 82 year old lady.  She is accompanied by her daughter  and her husband.  She recently was admitted to the hospital with congestive heart failure treated and released.  Echocardiogram reveals preserved ejection fraction.  Left atrial size was not noted.  She gives history of dyspnea on exertion.  At the time of my evaluation, the patient is alert awake oriented and in no distress.  Past Medical History:  Diagnosis Date   Aortic valve sclerosis 05/12/2021   Chronic cough 11/20/2021   Dyslipidemia 12/26/2016   Episodic lightheadedness 08/26/2017   Essential hypertension 07/13/2009   Qualifier: Diagnosis of   By: Roxan Hockey CMA, Jessica      IMO SNOMED Dx Update Oct 2024      Goiter 12/15/2009   Qualifier: Diagnosis of   By: Vassie Loll MD, Comer Locket      IMO SNOMED Dx Update Oct 2024     Leg pain, bilateral 01/15/2018   Malaise and fatigue 11/03/2018   Murmur 03/06/2017   OSA (obstructive sleep apnea) 11/08/2011   PSG 10/12 mild, 9/h     Other and unspecified hyperlipidemia    Other chest pain 05/12/2021   Palpitations 12/26/2016   Peripheral vascular disease (HCC) 12/26/2016   Viral upper respiratory tract infection 12/26/2016      Current Medications: Current Meds  Medication Sig   cloNIDine (CATAPRES) 0.2 MG tablet Take 0.2 mg by mouth daily.   cyanocobalamin 1000 MCG tablet Take 2,500 mcg by mouth daily.   ELIQUIS 5 MG TABS tablet Take 5 mg by mouth 2 (two) times daily.   hydrOXYzine (ATARAX) 25 MG tablet Take 25 mg by mouth 3 (three) times daily.   magnesium oxide (MAG-OX) 400 (240 Mg) MG tablet Take 400 mg by mouth daily.   methenamine (HIPREX) 1 g tablet Take 1 g by mouth daily.   metolazone (ZAROXOLYN) 2.5 MG tablet Take 1 tablet (2.5 mg total) by mouth daily.   metoprolol tartrate (LOPRESSOR) 100 MG tablet Take 100 mg by mouth 2 (two) times daily.   metoprolol tartrate (LOPRESSOR) 25 MG tablet Take 25 mg (1 tablet) Lopressor 2 hours prior to your CT scan for a heart rate greater than 55. This is in addition to your 100 mg dose.   montelukast (SINGULAIR) 10 MG tablet Take 10 mg by mouth at bedtime.   NIACINAMIDE PO Take 1,000 mg by mouth daily.   omeprazole (PRILOSEC) 40 MG capsule Take 40 mg by mouth 2 (two) times daily.     potassium chloride (KLOR-CON) 10 MEQ tablet Take 10 mEq by mouth 2 (two) times daily.   pravastatin (PRAVACHOL) 20 MG tablet Take 20 mg by mouth daily.   Probiotic Product (PROBIOTIC FORMULA PO) Take 1 capsule by mouth daily.   vitamin C (ASCORBIC ACID) 500 MG tablet Take 1,000 mg by mouth daily.   Vitamin D, Ergocalciferol, (DRISDOL) 50000 UNITS CAPS Take 1,000 Units by mouth daily.   Zinc Acetate 50 MG CAPS Take 1 capsule by  mouth daily.   [DISCONTINUED] amLODipine (NORVASC) 2.5 MG tablet Take 2.5 mg by mouth daily.   [DISCONTINUED] Calcium Carbonate-Vitamin D (CALCIUM 600+D) 600-200 MG-UNIT TABS Take 1 tablet by mouth daily.   [DISCONTINUED] furosemide (LASIX) 20 MG tablet Take 20 mg by mouth daily.   [DISCONTINUED] ibuprofen (ADVIL,MOTRIN) 400 MG tablet Take 200 mg by mouth every 6 (six) hours as needed for headache, mild pain (pain score 1-3) or moderate pain (pain score 4-6).   [DISCONTINUED] losartan (COZAAR) 25 MG tablet Take 25 mg by mouth 2 (two) times daily.   [DISCONTINUED] metoprolol  tartrate (LOPRESSOR) 25 MG tablet Take 25 mg by mouth daily as needed.   [DISCONTINUED] potassium chloride (K-DUR,KLOR-CON) 10 MEQ tablet Take 10 mEq by mouth 2 (two) times daily.    [DISCONTINUED] psyllium (METAMUCIL) 58.6 % powder Take 1 packet by mouth daily.     [DISCONTINUED] triamterene-hydrochlorothiazide (DYAZIDE) 37.5-25 MG per capsule Take 1 capsule by mouth daily.       Allergies:   Losartan, Clarithromycin, Iodine, Levofloxacin, Povidone-iodine, Ramipril, Atorvastatin, Bee venom, and Kiwi extract   Social History   Socioeconomic History   Marital status: Married    Spouse name: Not on file   Number of children: Not on file   Years of education: Not on file   Highest education level: Not on file  Occupational History   Occupation: housewife    Employer: RETIRED  Tobacco Use   Smoking status: Never    Passive exposure: Past   Smokeless tobacco: Former    Types: Snuff  Substance and Sexual Activity   Alcohol use: No   Drug use: No   Sexual activity: Not on file  Other Topics Concern   Not on file  Social History Narrative   Not on file   Social Determinants of Health   Financial Resource Strain: Not on file  Food Insecurity: Not on file  Transportation Needs: Not on file  Physical Activity: Not on file  Stress: Not on file  Social Connections: Not on file     Family History: The patient's  family history includes Heart attack in her mother.  ROS:   Please see the history of present illness.    All other systems reviewed and are negative.  EKGs/Labs/Other Studies Reviewed:    The following studies were reviewed today: .Marland KitchenEKG Interpretation Date/Time:  Thursday February 21 2023 10:01:35 EDT Ventricular Rate:  80 PR Interval:    QRS Duration:  68 QT Interval:  370 QTC Calculation: 426 R Axis:   48  Text Interpretation: Atrial fibrillation Low voltage QRS Cannot rule out Anterior infarct , age undetermined Abnormal ECG No previous ECGs available Confirmed by Belva Crome [phone]) on 02/21/2023 10:11:35 AM  Running behind   Recent Labs: No results found for requested labs within last 365 days.  Recent Lipid Panel No results found for: "CHOL", "TRIG", "HDL", "CHOLHDL", "VLDL", "LDLCALC", "LDLDIRECT"  Physical Exam:    VS:  BP (!) 152/82 (BP Location: Left Arm, Patient Position: Sitting)   Pulse 83   Ht 5\' 5"  (1.651 m)   Wt 186 lb 9.6 oz (84.6 kg)   SpO2 98%   BMI 31.05 kg/m     Wt Readings from Last 3 Encounters:  02/21/23 186 lb 9.6 oz (84.6 kg)  11/20/21 186 lb 6.4 oz (84.6 kg)  12/16/12 156 lb (70.8 kg)     GEN: Patient is in no acute distress HEENT: Normal NECK: No JVD; No carotid bruits LYMPHATICS: No lymphadenopathy CARDIAC: Hear sounds regular, 2/6 systolic murmur at the apex. RESPIRATORY:  Clear to auscultation without rales, wheezing or rhonchi  ABDOMEN: Soft, non-tender, non-distended MUSCULOSKELETAL:  No edema; No deformity  SKIN: Warm and dry NEUROLOGIC:  Alert and oriented x 3 PSYCHIATRIC:  Normal affect   Signed, Garwin Brothers, MD  02/21/2023 11:04 AM    [facility]

## 2023-02-21 NOTE — Patient Instructions (Addendum)
Please keep a BP log for 2 weeks and send by MyChart or mail.  Blood Pressure Record Sheet To take your blood pressure, you will need a blood pressure machine. You can buy a blood pressure machine (blood pressure monitor) at your clinic, drug store, or online. When choosing one, consider: An automatic monitor that has an arm cuff. A cuff that wraps snugly around your upper arm. You should be able to fit only one finger between your arm and the cuff. A device that stores blood pressure reading results. Do not choose a monitor that measures your blood pressure from your wrist or finger. Follow your health care provider's instructions for how to take your blood pressure. To use this form: Get one reading in the morning (a.m.) 1-2 hours after you take any medicines. Get one reading in the evening (p.m.) before supper.   Blood pressure log Date: _______________________  a.m. _____________________(1st reading) HR___________            p.m. _____________________(2nd reading) HR__________  Date: _______________________  a.m. _____________________(1st reading) HR___________            p.m. _____________________(2nd reading) HR__________  Date: _______________________  a.m. _____________________(1st reading) HR___________            p.m. _____________________(2nd reading) HR__________  Date: _______________________  a.m. _____________________(1st reading) HR___________            p.m. _____________________(2nd reading) HR__________  Date: _______________________  a.m. _____________________(1st reading) HR___________            p.m. _____________________(2nd reading) HR__________  Date: _______________________  a.m. _____________________(1st reading) HR___________            p.m. _____________________(2nd reading) HR__________  Date: _______________________  a.m. _____________________(1st reading) HR___________            p.m. _____________________(2nd reading)  HR__________   This information is not intended to replace advice given to you by your health care provider. Make sure you discuss any questions you have with your health care provider. Document Revised: 07/29/2019 Document Reviewed: 07/29/2019 Elsevier Patient Education  2021 Elsevier Inc.   Medication Instructions:  Your physician has recommended you make the following change in your medication:   Stop Furosemide (Lasix)  Start Zaroxolyn (Metolazone) 2.5 mg daily.  *If you need a refill on your cardiac medications before your next appointment, please call your pharmacy*   Lab Work: Your physician recommends that you have a BMP today in the office.  Your physician recommends that you return for lab work in: 1 week for BMP and nurse visit. You can come Monday through Friday 8:30 am to 12:00 pm and 1:15 to 4:30. You do not need to make an appointment as the order has already been placed.   If you have labs (blood work) drawn today and your tests are completely normal, you will receive your results only by: MyChart Message (if you have MyChart) OR A paper copy in the mail If you have any lab test that is abnormal or we need to change your treatment, we will call you to review the results.   Testing/Procedures:   Your cardiac CT will be scheduled at the below locations:   [facility] at [facility] [address] Floor, [address] [place],  [place]  [address]  Main: [phone]  Please follow these instructions carefully (unless otherwise directed):  On the Night Before the Test: Be sure to Drink plenty of water. Do not consume any caffeinated/decaffeinated beverages or chocolate  12 hours prior to your test. Do not take any antihistamines 12 hours prior to your test. If the patient has contrast allergy: Patient will need a prescription for Prednisone and very clear instructions (as follows): Prednisone 50 mg - take 13 hours prior to test Take  another Prednisone 50 mg 7 hours prior to test Take another Prednisone 50 mg 1 hour prior to test Take Benadryl 50 mg 1 hour prior to test Patient must complete all four doses of above prophylactic medications. Patient will need a ride after test due to Benadryl.  On the Day of the Test: Drink plenty of water until 1 hour prior to the test. Do not eat any food 1 hour prior to test. You may take your regular medications prior to the test.  Take metoprolol (Lopressor) two hours prior to test. Take 125 mg the day of the test. HOLD metolazone morning of the test. FEMALES- please wear underwire-free bra if available, avoid dresses & tight clothing      After the Test: Drink plenty of water. After receiving IV contrast, you may experience a mild flushed feeling. This is normal. On occasion, you may experience a mild rash up to 24 hours after the test. This is not dangerous. If this occurs, you can take Benadryl 25 mg and increase your fluid intake. If you experience trouble breathing, this can be serious. If it is severe call 911 IMMEDIATELY. If it is mild, please call our office. If you take any of these medications: Glipizide/Metformin, Avandament, Glucavance, please do not take 48 hours after completing test unless otherwise instructed.  We will call to schedule your test 2-4 weeks out understanding that some insurance companies will need an authorization prior to the service being performed.   For non-scheduling related questions, please contact the cardiac imaging nurse navigator should you have any questions/concerns: Rockwell Alexandria, Cardiac Imaging Nurse Navigator Larey Brick, Cardiac Imaging Nurse Navigator  Heart and Vascular Services Direct Office Dial: [phone]   For scheduling needs, including cancellations and rescheduling, please call [place], [phone].   Your next appointment:   2 week(s)  The format for your next appointment:   In Person  Provider:    Belva Crome, MD   Other Instructions Cardiac CT Angiogram A cardiac CT angiogram is a procedure to look at the heart and the area around the heart. It may be done to help find the cause of chest pains or other symptoms of heart disease. During this procedure, a substance called contrast dye is injected into the blood vessels in the area to be checked. A large X-ray machine, called a CT scanner, then takes detailed pictures of the heart and the surrounding area. The procedure is also sometimes called a coronary CT angiogram, coronary artery scanning, or CTA. A cardiac CT angiogram allows the health care provider to see how well blood is flowing to and from the heart. The health care provider will be able to see if there are any problems, such as: Blockage or narrowing of the coronary arteries in the heart. Fluid around the heart. Signs of weakness or disease in the muscles, valves, and tissues of the heart. Tell a health care provider about: Any allergies you have. This is especially important if you have had a previous allergic reaction to contrast dye. All medicines you are taking, including vitamins, herbs, eye drops, creams, and over-the-counter medicines. Any blood disorders you have. Any surgeries you have had. Any medical conditions you have. Whether you are  pregnant or may be pregnant. Any anxiety disorders, chronic pain, or other conditions you have that may increase your stress or prevent you from lying still. What are the risks? Generally, this is a safe procedure. However, problems may occur, including: Bleeding. Infection. Allergic reactions to medicines or dyes. Damage to other structures or organs. Kidney damage from the contrast dye that is used. Increased risk of cancer from radiation exposure. This risk is low. Talk with your health care provider about: The risks and benefits of testing. How you can receive the lowest dose of radiation. What happens before the  procedure? Wear comfortable clothing and remove any jewelry, glasses, dentures, and hearing aids. Follow instructions from your health care provider about eating and drinking. This may include: For 12 hours before the procedure -- avoid caffeine. This includes tea, coffee, soda, energy drinks, and diet pills. Drink plenty of water or other fluids that do not have caffeine in them. Being well hydrated can prevent complications. For 4-6 hours before the procedure -- stop eating and drinking. The contrast dye can cause nausea, but this is less likely if your stomach is empty. Ask your health care provider about changing or stopping your regular medicines. This is especially important if you are taking diabetes medicines, blood thinners, or medicines to treat problems with erections (erectile dysfunction). What happens during the procedure?  Hair on your chest may need to be removed so that small sticky patches called electrodes can be placed on your chest. These will transmit information that helps to monitor your heart during the procedure. An IV will be inserted into one of your veins. You might be given a medicine to control your heart rate during the procedure. This will help to ensure that good images are obtained. You will be asked to lie on an exam table. This table will slide in and out of the CT machine during the procedure. Contrast dye will be injected into the IV. You might feel warm, or you may get a metallic taste in your mouth. You will be given a medicine called nitroglycerin. This will relax or dilate the arteries in your heart. The table that you are lying on will move into the CT machine tunnel for the scan. The person running the machine will give you instructions while the scans are being done. You may be asked to: Keep your arms above your head. Hold your breath. Stay very still, even if the table is moving. When the scanning is complete, you will be moved out of the  machine. The IV will be removed. The procedure may vary among health care providers and hospitals. What can I expect after the procedure? After your procedure, it is common to have: A metallic taste in your mouth from the contrast dye. A feeling of warmth. A headache from the nitroglycerin. Follow these instructions at home: Take over-the-counter and prescription medicines only as told by your health care provider. If you are told, drink enough fluid to keep your urine pale yellow. This will help to flush the contrast dye out of your body. Most people can return to their normal activities right after the procedure. Ask your health care provider what activities are safe for you. It is up to you to get the results of your procedure. Ask your health care provider, or the department that is doing the procedure, when your results will be ready. Keep all follow-up visits as told by your health care provider. This is important. Contact a health care  provider if: You have any symptoms of allergy to the contrast dye. These include: Shortness of breath. Rash or hives. A racing heartbeat. Summary A cardiac CT angiogram is a procedure to look at the heart and the area around the heart. It may be done to help find the cause of chest pains or other symptoms of heart disease. During this procedure, a large X-ray machine, called a CT scanner, takes detailed pictures of the heart and the surrounding area after a contrast dye has been injected into blood vessels in the area. Ask your health care provider about changing or stopping your regular medicines before the procedure. This is especially important if you are taking diabetes medicines, blood thinners, or medicines to treat erectile dysfunction. If you are told, drink enough fluid to keep your urine pale yellow. This will help to flush the contrast dye out of your body. This information is not intended to replace advice given to you by your health care  provider. Make sure you discuss any questions you have with your health care provider. Document Revised: 12/03/2018 Document Reviewed: 12/03/2018 Elsevier Patient Education  2020 [employer].

## 2023-02-21 NOTE — Addendum Note (Signed)
Addended by: Eleonore Chiquito on: 02/21/2023 11:20 AM   Modules accepted: Orders

## 2023-02-22 DIAGNOSIS — I480 Paroxysmal atrial fibrillation: Secondary | ICD-10-CM | POA: Diagnosis not present

## 2023-02-22 DIAGNOSIS — D6869 Other thrombophilia: Secondary | ICD-10-CM | POA: Diagnosis not present

## 2023-02-22 DIAGNOSIS — N390 Urinary tract infection, site not specified: Secondary | ICD-10-CM | POA: Diagnosis not present

## 2023-02-22 DIAGNOSIS — E663 Overweight: Secondary | ICD-10-CM | POA: Diagnosis not present

## 2023-02-22 DIAGNOSIS — I10 Essential (primary) hypertension: Secondary | ICD-10-CM | POA: Diagnosis not present

## 2023-02-22 DIAGNOSIS — H60391 Other infective otitis externa, right ear: Secondary | ICD-10-CM | POA: Diagnosis not present

## 2023-02-22 DIAGNOSIS — Z6829 Body mass index (BMI) 29.0-29.9, adult: Secondary | ICD-10-CM | POA: Diagnosis not present

## 2023-02-22 DIAGNOSIS — Z889 Allergy status to unspecified drugs, medicaments and biological substances status: Secondary | ICD-10-CM | POA: Diagnosis not present

## 2023-02-22 DIAGNOSIS — H6121 Impacted cerumen, right ear: Secondary | ICD-10-CM | POA: Diagnosis not present

## 2023-02-22 LAB — BASIC METABOLIC PANEL
BUN/Creatinine Ratio: 19 (ref 12–28)
BUN: 16 mg/dL (ref 8–27)
CO2: 25 mmol/L (ref 20–29)
Calcium: 9.5 mg/dL (ref 8.7–10.3)
Chloride: 99 mmol/L (ref 96–106)
Creatinine, Ser: 0.84 mg/dL (ref 0.57–1.00)
Glucose: 103 mg/dL — ABNORMAL HIGH (ref 70–99)
Potassium: 4.2 mmol/L (ref 3.5–5.2)
Sodium: 139 mmol/L (ref 134–144)
eGFR: 69 mL/min/{1.73_m2} (ref >59)

## 2023-02-25 ENCOUNTER — Telehealth (HOSPITAL_COMMUNITY): Payer: Self-pay | Admitting: Emergency Medicine

## 2023-02-25 NOTE — Telephone Encounter (Signed)
Reaching out to patient to offer assistance regarding upcoming cardiac imaging study; pt verbalizes understanding of appt date/time, parking situation and where to check in, pre-test NPO status and medications ordered, and verified current allergies; name and call back number provided for further questions should they arise Rockwell Alexandria RN Navigator Cardiac Imaging [facility] [phone] office [phone] cell  [phone] prednisone

## 2023-02-26 ENCOUNTER — Ambulatory Visit (HOSPITAL_BASED_OUTPATIENT_CLINIC_OR_DEPARTMENT_OTHER)
Admission: RE | Admit: 2023-02-26 | Discharge: 2023-02-26 | Disposition: A | Discharge status: Home / Self Care | Payer: PPO | Source: Ambulatory Visit | Attending: Cardiology | Admitting: Cardiology

## 2023-02-26 ENCOUNTER — Other Ambulatory Visit: Payer: Self-pay

## 2023-02-26 ENCOUNTER — Emergency Department (HOSPITAL_BASED_OUTPATIENT_CLINIC_OR_DEPARTMENT_OTHER)
Admission: EM | Admit: 2023-02-26 | Discharge: 2023-02-26 | Disposition: A | Discharge status: Home / Self Care | Payer: PPO | Attending: Emergency Medicine | Admitting: Emergency Medicine

## 2023-02-26 ENCOUNTER — Encounter (HOSPITAL_BASED_OUTPATIENT_CLINIC_OR_DEPARTMENT_OTHER): Payer: Self-pay | Admitting: Urology

## 2023-02-26 ENCOUNTER — Encounter (HOSPITAL_BASED_OUTPATIENT_CLINIC_OR_DEPARTMENT_OTHER): Payer: Self-pay

## 2023-02-26 DIAGNOSIS — R0609 Other forms of dyspnea: Secondary | ICD-10-CM | POA: Insufficient documentation

## 2023-02-26 DIAGNOSIS — I10 Essential (primary) hypertension: Secondary | ICD-10-CM | POA: Diagnosis not present

## 2023-02-26 DIAGNOSIS — S81811A Laceration without foreign body, right lower leg, initial encounter: Secondary | ICD-10-CM | POA: Diagnosis not present

## 2023-02-26 DIAGNOSIS — W268XXA Contact with other sharp object(s), not elsewhere classified, initial encounter: Secondary | ICD-10-CM | POA: Insufficient documentation

## 2023-02-26 DIAGNOSIS — I4891 Unspecified atrial fibrillation: Secondary | ICD-10-CM | POA: Insufficient documentation

## 2023-02-26 DIAGNOSIS — S8991XA Unspecified injury of right lower leg, initial encounter: Secondary | ICD-10-CM | POA: Diagnosis present

## 2023-02-26 DIAGNOSIS — Z23 Encounter for immunization: Secondary | ICD-10-CM | POA: Insufficient documentation

## 2023-02-26 MED ORDER — METOPROLOL TARTRATE 5 MG/5ML IV SOLN
5.0000 mg | INTRAVENOUS | Status: DC | PRN
  Administered 2023-02-26 (×2): 10 mg via INTRAVENOUS

## 2023-02-26 MED ORDER — NITROGLYCERIN 0.4 MG SL SUBL
0.8000 mg | SUBLINGUAL_TABLET | Freq: Once | SUBLINGUAL | Status: AC
  Administered 2023-02-26: 0.8 mg via SUBLINGUAL

## 2023-02-26 MED ORDER — TETANUS-DIPHTH-ACELL PERTUSSIS 5-2.5-18.5 LF-MCG/0.5 IM SUSY
0.5000 mL | PREFILLED_SYRINGE | Freq: Once | INTRAMUSCULAR | Status: AC
  Administered 2023-02-26: 0.5 mL via INTRAMUSCULAR
  Filled 2023-02-26: qty 0.5

## 2023-02-26 MED ORDER — DILTIAZEM HCL 25 MG/5ML IV SOLN
10.0000 mg | Freq: Once | INTRAVENOUS | Status: AC
  Administered 2023-02-26: 10 mg via INTRAVENOUS

## 2023-02-26 MED ORDER — IOHEXOL 350 MG/ML SOLN
100.0000 mL | Freq: Once | INTRAVENOUS | Status: AC | PRN
  Administered 2023-02-26: 95 mL via INTRAVENOUS

## 2023-02-26 NOTE — Progress Notes (Signed)
Pt arrived to [facility] having taken all meds as prescribed.   13 hr prep compliant 100mg  metoprolol compliant  HR on arrival afib 75-108  20mg  max dose metoprolol IV given 20mg  max dose diltiazem IV given  HR at scan 89bpm.   Pt denies allergic reaction symtpoms  Pt reported orthostatic dizziness but resolved after a few seconds sitting on side of CT scanner.   Pt taken to lobby via wheelchair to family.  Encouraged to eat, drink plenty of water.  Rockwell Alexandria RN Navigator Cardiac Imaging [facility] [phone] Office  [phone] Cell

## 2023-02-26 NOTE — ED Provider Notes (Signed)
[facility] [place] Provider Note   CSN: [id number] Arrival date & time: 02/26/23  1005     History  Chief Complaint  Patient presents with  . Laceration    Tina Curry is a 82 y.o. female with PMHx chronic cough, PVD, HLD, HTN who presents to ED concerned for laceration. Patient stating that she hit right shin on metal part of furniture last night and this area has been bleeding a lot since. Patient is on blood thinners and states that she did take blood thinner last night and this morning. Denying numbness/paresthesias. Denies difficulty ambulating.   HPI     Home Medications Prior to Admission medications   Medication Sig Start Date End Date Taking? Authorizing Provider  cloNIDine (CATAPRES) 0.2 MG tablet Take 0.2 mg by mouth daily. 12/15/22   [provider]  cyanocobalamin 1000 MCG tablet Take 2,500 mcg by mouth daily.    [provider]  ELIQUIS 5 MG TABS tablet Take 5 mg by mouth 2 (two) times daily. 02/13/23   [provider]  hydrOXYzine (ATARAX) 25 MG tablet Take 25 mg by mouth 3 (three) times daily. 02/10/23   [provider]  magnesium oxide (MAG-OX) 400 (240 Mg) MG tablet Take 400 mg by mouth daily.    [provider]  methenamine (HIPREX) 1 g tablet Take 1 g by mouth daily. 11/30/22   [provider]  metolazone (ZAROXOLYN) 2.5 MG tablet Take 1 tablet (2.5 mg total) by mouth daily. 02/21/23 05/22/23  Revankar, Aundra Dubin, MD  metoprolol tartrate (LOPRESSOR) 100 MG tablet Take 100 mg by mouth 2 (two) times daily. 02/13/23   [provider]  metoprolol tartrate (LOPRESSOR) 25 MG tablet Take 25 mg (1 tablet) Lopressor 2 hours prior to your CT scan for a heart rate greater than 55. This is in addition to your 100 mg dose. 02/21/23   Revankar, Aundra Dubin, MD  montelukast (SINGULAIR) 10 MG tablet Take 10 mg by mouth at bedtime. 12/15/22   [provider]  NIACINAMIDE PO Take  1,000 mg by mouth daily.    [provider]  omeprazole (PRILOSEC) 40 MG capsule Take 40 mg by mouth 2 (two) times daily.      [provider]  potassium chloride (KLOR-CON) 10 MEQ tablet Take 10 mEq by mouth 2 (two) times daily. 10/29/22   [provider]  pravastatin (PRAVACHOL) 20 MG tablet Take 20 mg by mouth daily. 02/06/23   [provider]  predniSONE (DELTASONE) 50 MG tablet Prednisone 50 mg - take 13 hours prior to test Take another Prednisone 50 mg 7 hours prior to test Take another Prednisone 50 mg 1 hour prior to test Take Benadryl 50 mg 1 hour prior to test Patient must complete all four doses of above prophylactic medications. Patient will need a ride after test due to Benadryl. 02/21/23   Revankar, Aundra Dubin, MD  Probiotic Product (PROBIOTIC FORMULA PO) Take 1 capsule by mouth daily.    [provider]  vitamin C (ASCORBIC ACID) 500 MG tablet Take 1,000 mg by mouth daily.    [provider]  Vitamin D, Ergocalciferol, (DRISDOL) 50000 UNITS CAPS Take 1,000 Units by mouth daily.    [provider]  Zinc Acetate 50 MG CAPS Take 1 capsule by mouth daily.    [provider]      Allergies    Losartan, Clarithromycin, Iodine, Levofloxacin, Povidone-iodine, Ramipril, Atorvastatin, Bee venom, Iodinated contrast media,  and Kiwi extract    Review of Systems   Review of Systems  Skin:        laceration    Physical Exam Updated Vital Signs BP (!) 164/77   Pulse 64   Temp 97.6 F (36.4 C) (Oral)   Resp 18   Ht 5\' 5"  (1.651 m)   Wt 84.6 kg   SpO2 94%   BMI 31.04 kg/m  Physical Exam Vitals and nursing note reviewed.  Constitutional:      General: She is not in acute distress.    Appearance: She is not ill-appearing or toxic-appearing.  HENT:     Head: Normocephalic and atraumatic.  Eyes:     General: No scleral icterus.       Right eye: No discharge.        Left eye: No discharge.     Conjunctiva/sclera:  Conjunctivae normal.  Cardiovascular:     Rate and Rhythm: Normal rate.  Pulmonary:     Effort: Pulmonary effort is normal.  Abdominal:     General: Abdomen is flat.  Skin:    General: Skin is warm and dry.     Comments: 4cm superficial laceration on right anterior shin with slow oozing of blood from the superior part of laceration. Skin was avulsed and very thin/delicate around laceration. No obvious contamination or foreign body. No surrounding erythema, increased warmth, swelling, or purulence. +2 pedal pulse. Sensation to light touch intact. Patient ambulatory without complaint.   Neurological:     General: No focal deficit present.     Mental Status: She is alert. Mental status is at baseline.  Psychiatric:        Mood and Affect: Mood normal.        Behavior: Behavior normal.    ED Results / Procedures / Treatments   Labs (all labs ordered are listed, but only abnormal results are displayed) Labs Reviewed - No data to display  EKG None  Radiology CT CORONARY MORPH W/CTA COR W/SCORE W/CA W/CM &/OR WO/CM  Result Date: 02/26/2023 CLINICAL DATA:  CP EXAM: Cardiac/Coronary  CTA TECHNIQUE: The patient was scanned on a GE Revolution scanner. FINDINGS: A 120 kV prospective scan was triggered in the descending thoracic aorta at 111 HU's. Axial non-contrast 3 mm slices were carried out through the heart. The data set was analyzed on a dedicated work station and scored using the Agatson method. Gantry rotation speed was 250 msecs and collimation was .6 mm. No beta blockade and 0.8 mg of sl NTG was given. The 3D data set was reconstructed in 5% intervals of the 67-82 % of the R-R cycle. Diastolic phases were analyzed on a dedicated work station using MPR, MIP and VRT modes. The patient received 80 cc of contrast. Aorta:  Normal size.  Mild calcifications.  No dissection. Aortic Valve:  Trileaflet.  Mild calcifications. Coronary Arteries:  Normal coronary origin.  Right dominance. RCA is a  large dominant artery that gives rise to PDA and PLA. There is no plaque. Left main is a large artery that gives rise to LAD and LCX arteries. Small calcified plaque with minimal stenosis of 0-24%. LAD is a large vessel that has multiple, small, calcified plaques noted in proximal and mid portion of this artery with mild stenosis of 25-49%. In the distal portion of LAD there is calcified plaque with mild stenosis of 25-49%. Very small D1 and D2 is noted. Moderate size D3 has mild stenosis in the proximal portion with 25-49% stenosis.  LCX is a non-dominant artery that gives rise to one large OM1 branch. There is small, calcified plaque in the proximal portion of this artery with minimal stenosis of 0-24%. Other findings: Normal pulmonary vein drainage into the left atrium. Small rt middle pulmonary vein noted - normal variant. Normal left atrial appendage without a thrombus. Normal size of the pulmonary artery. IMPRESSION: 1. Coronary calcium score of 332. This was 57 percentile for age and sex matched control. 2. Normal coronary origin with right dominance. 3. CAD-RADS 2. Mild non-obstructive CAD (25-49%) LAD. Consider non-atherosclerotic causes of chest pain. Consider preventive therapy and risk factor modification. 4. Plaque analysis: Total volume 362 mm3 - 40th percentile, calcified plaque 55 mm3, non calcified plaque 307 mm3 Electronically Signed   By: Gypsy Balsam M.D.   On: 02/26/2023 12:19    Procedures .Marland KitchenLaceration Repair  Date/Time: 02/26/2023 5:53 PM  Performed by: Dorthy Cooler, PA-C Authorized by: Dorthy Cooler, PA-C   Consent:    Consent obtained:  Verbal   Consent given by:  Patient   Risks, benefits, and alternatives were discussed: yes     Alternatives discussed:  No treatment Universal protocol:    Procedure explained and questions answered to patient or proxy's satisfaction: yes     Patient identity confirmed:  Verbally with patient Anesthesia:    Anesthesia  method:  None Laceration details:    Length (cm):  4 Exploration:    Hemostasis achieved with:  Direct pressure Treatment:    Area cleansed with:  Saline Skin repair:    Repair method:  Steri-Strips and tissue adhesive Approximation:    Approximation:  Loose Post-procedure details:    Dressing:  Sterile dressing   Procedure completion:  Tolerated well, no immediate complications Comments:     Patient on blood thinners. Multiple attempts to stop the constant slow oozing of blood to include combat gauze, steri strips, dermabond, and held pressure x3 attempts. At least 45 minutes were spent caring to patient's laceration to stop the bleeding. Sutures were not appropriate d/t the delicate nature of patient's skin and superficial nature of the bleed.     Medications Ordered in ED Medications  Tdap (BOOSTRIX) injection 0.5 mL (0.5 mLs Intramuscular Given 02/26/23 1137)    ED Course/ Medical Decision Making/ A&P                                 Medical Decision Making Risk Prescription drug management.   This patient presents to the ED for concern of a  4 cm laceration to their right shin, this involves an extensive number of treatment options, and is a complaint that carries with it a high risk of complications and morbidity.  The differential diagnosis includes foreign body, fracture, NV compromise. These are considered less likely due to history of present illness and physical exam findings.   Co morbidities that complicate the patient evaluation  Afib on blood thinners BID   Additional history obtained:  Dr. Casper Harrison PCP   Problem List / ED Course / Critical interventions / Medication management  Patient presented for a 4cm cm laceration to their right shin. They are neurovascularly intact. Tetanus is provided in ED today. Patient is in no distress. Laceration is through very thin skin and I do not believe that sutures would be appropriate for this laceration. I applied  steri-strips to help hold the laceration closed. Blood continued to slowly ooze from laceration  site so I applied combat gauze, applied pressure, and waited 20 minutes. Upon reevaluation blood was still slowly oozing past gauze site and down leg.  Applied Dermabond to oozing site which was on the more superior part of the laceration.  Then placed combat gauze onto wound and applied pressure again for 20 minutes.  Bleeding appeared to slow.  Got patient up and walking around the ED without concern for further bleeding. Recommend that patient hold her blood thinner dose tonight and call her cardiologist earlier in the morning to assess her blood thinner dosage.  Patient verbalized understanding of plan.  Also recommended following up with PCP.  There was some mild oozing of blood coming through gauze upon discharge.  I applied more gauze and educated patient to keep the right leg elevated tonight to help promote clotting.  Educated patient and family to leave gauze in place for the next 24 hours. Patient/family educated about specific return precautions for given chief complaint and symptoms.  Laceration repaired without complication and with pulse, motor, sensation intact. Patient is stable for discharge at this time. Patient/family expressed understanding of return precautions and need for follow-up. education provided in verbal form with additional information in written form. Time was allowed for answering of patient questions. Patient discharged. I have reviewed the patients home medicines and have made adjustments as needed Patient was given return precautions. Patient stable for discharge at this time.  Patient verbalized understanding of plan.   Social Determinants of Health:  none            Final Clinical Impression(s) / ED Diagnoses Final diagnoses:  Laceration of right lower extremity, initial encounter    Rx / DC Orders ED Discharge Orders     None         Dorthy Cooler, [place] 02/26/23 1801    Virgina Norfolk, DO 03/01/23 [phone]

## 2023-02-26 NOTE — ED Triage Notes (Signed)
Large laceration/skin tear to right lower leg last night Bleeding controlled  On eliquis  Tdap unknown

## 2023-02-26 NOTE — Discharge Instructions (Addendum)
It was a pleasure caring for you today.  Please call your cardiologist tomorrow to follow-up on your blood thinners.  Seek emergency care if experiencing any new or worsening symptoms.

## 2023-02-27 ENCOUNTER — Telehealth: Payer: Self-pay

## 2023-02-27 ENCOUNTER — Telehealth: Payer: Self-pay | Admitting: Cardiology

## 2023-02-27 DIAGNOSIS — I2510 Atherosclerotic heart disease of native coronary artery without angina pectoris: Secondary | ICD-10-CM

## 2023-02-27 NOTE — Telephone Encounter (Signed)
-----   Message from Aundra Dubin Revankar sent at 02/26/2023  6:58 PM EST ----- Please get her in for bmp and LL check. Cc pcp Garwin Brothers, MD 02/26/2023 6:58 PM

## 2023-02-27 NOTE — Telephone Encounter (Signed)
Results reviewed with pt as per Dr. Revankar's note.  Pt verbalized understanding and had no additional questions. Routed to PCP.  

## 2023-02-27 NOTE — Telephone Encounter (Signed)
Please advise on Eliquis and wound.  When I spoke with pt regarding her CT results she was mentioning her shortness of breath. Is there anything I need to tell her to do or other test for her shortness of breath since her results are mild non obstructive CAD?  IMPRESSION: 1. Coronary calcium score of 332. This was 37 percentile for age and sex matched control.   2. Normal coronary origin with right dominance.   3. CAD-RADS 2. Mild non-obstructive CAD (25-49%) LAD. Consider non-atherosclerotic causes of chest pain. Consider preventive therapy and risk factor modification.   4. Plaque analysis: Total volume 362 mm3 - 40th percentile, calcified plaque 55 mm3, non calcified plaque 307 mm3

## 2023-02-27 NOTE — Telephone Encounter (Signed)
Advised that she is at a slim risk of stroke if she holds her Eliquis for 2 days as to not start bleeding when taking her dressing off tonight. Pt states that she will hold Eliquis. Pt verbalized understanding and had no additional questions.

## 2023-02-27 NOTE — Telephone Encounter (Signed)
Pt c/o medication issue:  1. Name of Medication: ELIQUIS 5 MG TABS tablet   2. How are you currently taking this medication (dosage and times per day)? Take 5 mg by mouth 2 (two) times daily.   3. Are you having a reaction (difficulty breathing--STAT)? No  4. What is your medication issue? Patient cut her leg the other day and it continued to bleed. Patient stated the ER was able to wrap her leg last night from the bleeding. Patient is requesting to know if she should take her Eliquis. Patient stated she takes it in the morning and will be waiting to take it until further notice. Please advise.

## 2023-02-28 ENCOUNTER — Ambulatory Visit: Payer: PPO | Attending: Cardiology

## 2023-02-28 DIAGNOSIS — I2510 Atherosclerotic heart disease of native coronary artery without angina pectoris: Secondary | ICD-10-CM | POA: Diagnosis not present

## 2023-02-28 DIAGNOSIS — Z6828 Body mass index (BMI) 28.0-28.9, adult: Secondary | ICD-10-CM | POA: Diagnosis not present

## 2023-02-28 DIAGNOSIS — Z5181 Encounter for therapeutic drug level monitoring: Secondary | ICD-10-CM

## 2023-02-28 DIAGNOSIS — I10 Essential (primary) hypertension: Secondary | ICD-10-CM

## 2023-02-28 DIAGNOSIS — S81811A Laceration without foreign body, right lower leg, initial encounter: Secondary | ICD-10-CM | POA: Diagnosis not present

## 2023-02-28 NOTE — Progress Notes (Signed)
   Nurse Visit   Date of Encounter: 02/28/2023 ID: Tina Curry, DOB [birth year], MRN [id number]  PCP:  Street, Stephanie Coup, MD   [facility] Providers Cardiologist:  None      Visit Details   VS:  BP (!) 160/84 (BP Location: Left Arm, Patient Position: Sitting)   Pulse 77   Ht 5\' 5"  (1.651 m)   Wt 182 lb 6.4 oz (82.7 kg)   SpO2 96%   BMI 30.35 kg/m  , BMI Body mass index is 30.35 kg/m.  Wt Readings from Last 3 Encounters:  02/28/23 182 lb 6.4 oz (82.7 kg)  02/26/23 186 lb 8.2 oz (84.6 kg)  02/21/23 186 lb 9.6 oz (84.6 kg)     Reason for visit: Stopped Lasix, started Zaroxolyn. Stopped Eliquis Tuesday due to leg injury. Was in ED and will follow up with PCP today. Per Dr. Kem Parkinson note she will resume Eliquis tonight.  Performed today:  Changes (medications, testing, etc.) : NO changes Length of Visit: 15 minutes Will send to Dr. Tomie China for review.    Medications Adjustments/Labs and Tests Ordered: No orders of the defined types were placed in this encounter.  No orders of the defined types were placed in this encounter.    Signed, Neena Rhymes, RN  02/28/2023 9:04 AM

## 2023-03-01 LAB — COMPREHENSIVE METABOLIC PANEL
ALT: 26 [IU]/L (ref 0–32)
AST: 23 [IU]/L (ref 0–40)
Albumin: 3.2 g/dL — ABNORMAL LOW (ref 3.7–4.7)
Alkaline Phosphatase: 93 [IU]/L (ref 44–121)
BUN/Creatinine Ratio: 23 (ref 12–28)
BUN: 21 mg/dL (ref 8–27)
Bilirubin Total: 0.3 mg/dL (ref 0.0–1.2)
CO2: 31 mmol/L — ABNORMAL HIGH (ref 20–29)
Calcium: 9.5 mg/dL (ref 8.7–10.3)
Chloride: 96 mmol/L (ref 96–106)
Creatinine, Ser: 0.93 mg/dL (ref 0.57–1.00)
Globulin, Total: 2.7 g/dL (ref 1.5–4.5)
Glucose: 89 mg/dL (ref 70–99)
Potassium: 3.9 mmol/L (ref 3.5–5.2)
Sodium: 139 mmol/L (ref 134–144)
Total Protein: 5.9 g/dL — ABNORMAL LOW (ref 6.0–8.5)
eGFR: 61 mL/min/{1.73_m2} (ref >59)

## 2023-03-01 LAB — LIPID PANEL
Chol/HDL Ratio: 4.2 ratio (ref 0.0–4.4)
Cholesterol, Total: 174 mg/dL (ref 100–199)
HDL: 41 mg/dL (ref >39)
LDL Chol Calc (NIH): 107 mg/dL — ABNORMAL HIGH (ref 0–99)
Triglycerides: 147 mg/dL (ref 0–149)
VLDL Cholesterol Cal: 26 mg/dL (ref 5–40)

## 2023-03-01 MED ORDER — RIVAROXABAN 20 MG PO TABS: 20.0000 mg | ORAL_TABLET | Freq: Every day | ORAL | 12 refills | Status: DC

## 2023-03-01 NOTE — Telephone Encounter (Signed)
Called pt and advised as Tina Curry, [facility]. Pt states that when she was off of it for 2 days she felt great and once she restarted it yesterday she was very jittery and short of breath. Please advise

## 2023-03-01 NOTE — Telephone Encounter (Signed)
Pt states that she started Eliquis on 02/13/23 and has been having these same sx since. Pt states that she went off of the medication for 2 days and felt great. Pt states that she restarted the medication and the sx returned. Pt did not take her medication this am and is starting to feel better.

## 2023-03-01 NOTE — Telephone Encounter (Signed)
Recommendations reviewed with pt as per Dr. Revankar's note.  Pt verbalized understanding and had no additional questions.   

## 2023-03-01 NOTE — Telephone Encounter (Signed)
Pt called in stating since restarting Eliquis she has been jittery and not breathing well. Please advise.

## 2023-03-01 NOTE — Addendum Note (Signed)
Addended by: Eleonore Chiquito on: 03/01/2023 11:01 AM   Modules accepted: Orders

## 2023-03-06 ENCOUNTER — Other Ambulatory Visit: Payer: Self-pay

## 2023-03-07 ENCOUNTER — Encounter: Payer: Self-pay | Admitting: Allergy and Immunology

## 2023-03-07 ENCOUNTER — Ambulatory Visit: Payer: PPO | Admitting: Allergy and Immunology

## 2023-03-07 ENCOUNTER — Encounter: Payer: Self-pay | Admitting: Cardiology

## 2023-03-07 ENCOUNTER — Ambulatory Visit: Payer: PPO | Attending: Cardiology | Admitting: Cardiology

## 2023-03-07 VITALS — BP 126/72 | HR 68 | Resp 18 | Ht 64.5 in | Wt 176.8 lb

## 2023-03-07 VITALS — BP 138/78 | HR 72 | Ht 65.0 in | Wt 176.6 lb

## 2023-03-07 DIAGNOSIS — I480 Paroxysmal atrial fibrillation: Secondary | ICD-10-CM

## 2023-03-07 DIAGNOSIS — L500 Allergic urticaria: Secondary | ICD-10-CM

## 2023-03-07 DIAGNOSIS — T781XXD Other adverse food reactions, not elsewhere classified, subsequent encounter: Secondary | ICD-10-CM | POA: Diagnosis not present

## 2023-03-07 DIAGNOSIS — J310 Chronic rhinitis: Secondary | ICD-10-CM | POA: Diagnosis not present

## 2023-03-07 DIAGNOSIS — I4819 Other persistent atrial fibrillation: Secondary | ICD-10-CM

## 2023-03-07 DIAGNOSIS — I10 Essential (primary) hypertension: Secondary | ICD-10-CM | POA: Diagnosis not present

## 2023-03-07 DIAGNOSIS — I2510 Atherosclerotic heart disease of native coronary artery without angina pectoris: Secondary | ICD-10-CM

## 2023-03-07 HISTORY — DX: Paroxysmal atrial fibrillation: I48.0

## 2023-03-07 HISTORY — DX: Atherosclerotic heart disease of native coronary artery without angina pectoris: I25.10

## 2023-03-07 HISTORY — DX: Other persistent atrial fibrillation: I48.19

## 2023-03-07 MED ORDER — IPRATROPIUM BROMIDE 0.06 % NA SOLN
NASAL | 5 refills | Status: DC
Start: 2023-03-07 — End: 2023-10-21

## 2023-03-07 NOTE — Progress Notes (Signed)
[place] - [place] - [place] - [place] - [place]   Dear Dr. Casper Harrison,  Thank you for referring Tina Curry to the [facility] [place] on 03/07/2023.   Below is a summation of this patient's evaluation and recommendations.  Thank you for your referral. I will keep you informed about this patient's response to treatment.   If you have any questions please do not hesitate to contact me.   Sincerely,  Jessica Priest, MD Allergy / Immunology [place] [facility]   ______________________________________________________________________    NEW PATIENT NOTE  Referring Provider: Street, Stephanie Coup, * Primary Provider: Street, Stephanie Coup, MD Date of office visit: 03/07/2023    Subjective:   Chief Complaint:  Tina Curry (DOB: Jan 18, 1941) is a 82 y.o. female who presents to the clinic on 03/07/2023 with a chief complaint of Allergic Reaction .     HPI: [name] presents to the clinic in evaluation of "allergic reactions".  She is here today with her husband.  Apparently sometime in July 2024 she started to develop red raised itchy lesions across her torso that would last several days and never heal with scar / hyperpigmentation and occasionally they were associated with lip swelling and eye swelling but no other associated systemic or constitutional symptoms.  It sounds as though this was a recurrent issue until her losartan was discontinued and she has not had any of the skin issues since her losartan was discontinued.  And there is a history of possibly having a problem with egg consumption.  She might of had some additional lip and eye swelling when consuming egg based product such as mayonnaise and she has been avoiding all egg consumption.  She does have a history of having an irritated mouth when eating kiwi, a history of radiocontrast media dye hypersensitivity reaction for which she is not eating  shellfish, and she has been avoiding the administration of the flu vaccine because of her possible egg sensitivity.  She is taking montelukast for a continuous runny nose.  She still continues to have a runny nose while using montelukast.  She has had a summer and fall of recurrent episodes of A-fib that have given rise to significant dyspnea on occasion but that sounds as though is under much better control at this point in time since she has been evaluated and treated by cardiology and been given a beta-blocker and she is using anticoagulation.  Past Medical History:  Diagnosis Date   Aortic valve sclerosis 05/12/2021   Atrial fibrillation (HCC) 02/21/2023   Chronic cough 11/20/2021   Dyslipidemia 12/26/2016   Episodic lightheadedness 08/26/2017   Essential hypertension 07/13/2009   Qualifier: Diagnosis of   By: Roxan Hockey CMA, Milas Kocher SNOMED Dx Update Oct 2024     Goiter 12/15/2009   Qualifier: Diagnosis of   By: Vassie Loll MD, Comer Locket      IMO SNOMED Dx Update Oct 2024     Leg pain, bilateral 01/15/2018   Malaise and fatigue 11/03/2018   Meningitis    Murmur 03/06/2017   OSA (obstructive sleep apnea) 11/08/2011   PSG 10/12 mild, 9/h     Other and unspecified hyperlipidemia    Other chest pain 05/12/2021   Palpitations 12/26/2016   Peripheral vascular disease (HCC) 12/26/2016   Viral upper respiratory tract infection 12/26/2016    Past Surgical History:  Procedure Laterality Date   BACK SURGERY  1990 and  2001   bladdr tack  03/06/2004   CATARACT EXTRACTION Left 2011   CATARACT EXTRACTION Right 2017   CHOLECYSTECTOMY  02/18/2009   EYE SURGERY     left   FOOT SURGERY Left 1992   left   TUBAL LIGATION  04/23/1972   VESICOVAGINAL FISTULA CLOSURE W/ TAH  04/23/1992   VITRECTOMY Left 2010   left eye    Allergies as of 03/07/2023       Reactions   Eliquis [apixaban] Shortness Of Breath   Losartan Swelling   Cephalexin    Clarithromycin    REACTION: metalic taste  in mouth   Iodine    REACTION: rash,itch   Levofloxacin    Multiple side effects   Povidone-iodine    REACTION: "turned red"   Ramipril Cough   Tequin [gatifloxacin] Other (See Comments)   AWFUL TASTE   Atorvastatin Itching, Other (See Comments)   Generalized weakness Generalized weakness  Generalized weakness Generalized weakness, Generalized weakness   Bee Venom Rash   Iodinated Contrast Media Rash   Kiwi Extract Rash        Medication List    ascorbic acid 500 MG tablet Commonly known as: VITAMIN C Take 1,000 mg by mouth daily.   cloNIDine 0.2 MG tablet Commonly known as: CATAPRES Take 0.2 mg by mouth daily.   cyanocobalamin 1000 MCG tablet Take 2,500 mcg by mouth daily.   D3 25 MCG (1000 UT) capsule Generic drug: Cholecalciferol Take 1,000 Units by mouth daily.   hydrOXYzine 25 MG tablet Commonly known as: ATARAX Take 25 mg by mouth 3 (three) times daily.   magnesium oxide 400 (240 Mg) MG tablet Commonly known as: MAG-OX Take 400 mg by mouth daily.   methenamine 1 g tablet Commonly known as: HIPREX Take 1 g by mouth daily.   metolazone 2.5 MG tablet Commonly known as: ZAROXOLYN Take 1 tablet (2.5 mg total) by mouth daily.   metoprolol tartrate 25 MG tablet Commonly known as: LOPRESSOR Take 100 mg by mouth 2 (two) times daily.   montelukast 10 MG tablet Commonly known as: SINGULAIR Take 10 mg by mouth at bedtime.   NIACINAMIDE PO Take 1,000 mg by mouth daily.   omeprazole 40 MG capsule Commonly known as: PRILOSEC Take 40 mg by mouth 2 (two) times daily.   potassium chloride 10 MEQ tablet Commonly known as: KLOR-CON Take 10 mEq by mouth 2 (two) times daily.   pravastatin 20 MG tablet Commonly known as: PRAVACHOL Take 20 mg by mouth daily.   PROBIOTIC FORMULA PO Take 1 capsule by mouth daily.   rivaroxaban 20 MG Tabs tablet Commonly known as: XARELTO Take 1 tablet (20 mg total) by mouth daily with supper.   Zinc Acetate 50 MG  Caps Take 1 capsule by mouth daily.    Review of systems negative except as noted in HPI / PMHx or noted below:  Review of Systems  Constitutional: Negative.   HENT: Negative.    Eyes: Negative.   Respiratory: Negative.    Cardiovascular: Negative.   Gastrointestinal: Negative.   Genitourinary: Negative.   Musculoskeletal: Negative.   Skin: Negative.   Neurological: Negative.   Endo/Heme/Allergies: Negative.   Psychiatric/Behavioral: Negative.      Family History  Problem Relation Age of Onset   Heart attack Mother    Heart attack Father    Stroke Father    Other Sister        Brain tumor   Other Brother  Brain tumor    Social History   Socioeconomic History   Marital status: Married    Spouse name: Not on file   Number of children: Not on file   Years of education: Not on file   Highest education level: Not on file  Occupational History   Occupation: housewife    Employer: RETIRED  Tobacco Use   Smoking status: Never    Passive exposure: Past   Smokeless tobacco: Former    Types: Snuff  Substance and Sexual Activity   Alcohol use: No   Drug use: No   Sexual activity: Not on file  Other Topics Concern   Not on file  Social History Narrative   Not on file   Environmental and Social history  Lives in a house with a dry environment, dogs located inside the household, no plastic on the bed, no plastic on the pillow, no smoking ongoing with inside the household.  Objective:   Vitals:   03/07/23 0824  BP: 126/72  Pulse: 68  Resp: 18  SpO2: 97%   Height: 5' 4.5" (163.8 cm) Weight: 176 lb 12.8 oz (80.2 kg)  Physical Exam Constitutional:      Appearance: She is not diaphoretic.  HENT:     Head: Normocephalic.     Right Ear: Tympanic membrane, ear canal and external ear normal.     Left Ear: Tympanic membrane, ear canal and external ear normal.     Nose: Nose normal. No mucosal edema or rhinorrhea.     Mouth/Throat:     Pharynx: Uvula  midline. No oropharyngeal exudate.  Eyes:     Conjunctiva/sclera: Conjunctivae normal.  Neck:     Thyroid: No thyromegaly.     Trachea: Trachea normal. No tracheal tenderness or tracheal deviation.  Cardiovascular:     Rate and Rhythm: Normal rate and regular rhythm.     Heart sounds: Normal heart sounds, S1 normal and S2 normal. No murmur heard. Pulmonary:     Effort: No respiratory distress.     Breath sounds: Normal breath sounds. No stridor. No wheezing or rales.  Lymphadenopathy:     Head:     Right side of head: No tonsillar adenopathy.     Left side of head: No tonsillar adenopathy.     Cervical: No cervical adenopathy.  Skin:    Findings: No erythema or rash.     Nails: There is no clubbing.  Neurological:     Mental Status: She is alert.     Diagnostics: Allergy skin tests were not performed.   Results of blood tests obtained 28 February 2023 identifies creatinine 0.93 MGs/DL, AST 16X/W, ALT 96E/A,  Assessment and Plan:    1. Adverse food reaction, subsequent encounter   2. Allergic urticaria   3. Non-allergic rhinitis    1. Blood - CBC w/d, alpha-gal panel, shellfish panel, egg components, kiwi IgE  2. Can continue montelukast 10 mg - 1 tablet 1 time per day  3. Can use nasal ipratropium 0.06% - 2 sprays each nostril every 6 hours to dry nose  4. Further evaluation??? Yes, if recurrent reactions  5. Obtain flu vaccine  Maddisyn has several issues and we will see if she does indeed have a food hypersensitivity state with the blood test noted above but fortunately her recurrent episodes urticaria appear to have resolved at this point in time.  She was has a component of rhinitis mostly with fluid production from her upper airway and she can use nasal  ipratropium.  I will contact her with the results of her blood test and we will make a decision about how to proceed based upon her response to the therapy noted above.  I did inform her that there are now several  published study showing the safety of receiving the flu vaccine and people who have severe egg allergy.  As well, and informed her that the association between radiocontrast media dye hypersensitivity and shellfish allergy is tenuous at best and has never been shown to exist within a causal relationship.  Jessica Priest, MD Allergy / Immunology [place] [place]

## 2023-03-07 NOTE — Progress Notes (Signed)
Cardiology Office Note:    Date:  03/07/2023   ID:  Tina Curry, DOB [birth year], MRN [id number]  PCP:  Street, Tina Coup, MD  Cardiologist:  Garwin Brothers, MD   Referring MD: [address], Tina Curry, *    ASSESSMENT:    1. Coronary artery disease involving native heart, unspecified vessel or lesion type, unspecified whether angina present   2. Essential hypertension   3. Paroxysmal atrial fibrillation (HCC)    PLAN:    In order of problems listed above:  Coronary artery disease: Nonobstructive in nature: I discussed my findings with the patient and family at extensive length and questions were answered to their satisfaction. Newly diagnosed atrial fibrillation: She changed her anticoagulant.  She is now taking Xarelto.  It has been about 5 or 6 days.  She has symptoms and wants to feel better.  I discussed TEE guided cardioversion.  She is agreeable.  I discussed this with my partner Dr.Madireddy and he is agreeable.  I discussed procedure, benefits and potential risks with the patient.  Questions were answered to their satisfaction.  Will set her up for this for December 3 as the first available slot for my partner.  Patient was advised to take uninterrupted anticoagulation.  And she promises to do so. Essential hypertension: Blood pressure stable and diet was emphasized. Mixed dyslipidemia: Diet was emphasized.  This is managed by primary care. Patient will be seen in follow-up appointment in 6 months or earlier if the patient has any concerns.    Medication Adjustments/Labs and Tests Ordered: Current medicines are reviewed at length with the patient today.  Concerns regarding medicines are outlined above.  No orders of the defined types were placed in this encounter.  No orders of the defined types were placed in this encounter.    No chief complaint on file.    History of Present Illness:    Tina Curry is a 82 y.o. female.  Patient was evaluated for shortness of  breath.  She has atrial fibrillation, now diagnosed to have nonobstructive coronary artery disease and mixed dyslipidemia.  She denies any problems except some shortness of breath and some orthopnea.  She is on diuretic.  At the time of my evaluation, the patient is alert awake oriented and in no distress.  Patient complains of some orthopnea and PND.  Overall she is doing well.  Past Medical History:  Diagnosis Date   Aortic valve sclerosis 05/12/2021   Atrial fibrillation (HCC) 02/21/2023   Chronic cough 11/20/2021   Dyslipidemia 12/26/2016   Episodic lightheadedness 08/26/2017   Essential hypertension 07/13/2009   Qualifier: Diagnosis of   By: Roxan Hockey CMA, Milas Kocher SNOMED Dx Update Oct 2024     Goiter 12/15/2009   Qualifier: Diagnosis of   By: Vassie Loll MD, Comer Locket      IMO SNOMED Dx Update Oct 2024     Leg pain, bilateral 01/15/2018   Malaise and fatigue 11/03/2018   Meningitis    Murmur 03/06/2017   OSA (obstructive sleep apnea) 11/08/2011   PSG 10/12 mild, 9/h     Other and unspecified hyperlipidemia    Other chest pain 05/12/2021   Palpitations 12/26/2016   Peripheral vascular disease (HCC) 12/26/2016   Viral upper respiratory tract infection 12/26/2016    Past Surgical History:  Procedure Laterality Date   BACK SURGERY  1990 and 2001   bladdr tack  03/06/2004   CATARACT EXTRACTION Left 2011   CATARACT EXTRACTION Right  2017   CHOLECYSTECTOMY  02/18/2009   EYE SURGERY     left   FOOT SURGERY Left 1992   left   TUBAL LIGATION  04/23/1972   VESICOVAGINAL FISTULA CLOSURE W/ TAH  04/23/1992   VITRECTOMY Left 2010   left eye    Current Medications: Current Meds  Medication Sig   Cholecalciferol (D3) 25 MCG (1000 UT) capsule Take 1,000 Units by mouth daily.   cloNIDine (CATAPRES) 0.2 MG tablet Take 0.2 mg by mouth daily.   cyanocobalamin 1000 MCG tablet Take 2,500 mcg by mouth daily.   ipratropium (ATROVENT) 0.06 % nasal spray Can use two sprays in each nostril  every six hours to dry up runny nose.   magnesium oxide (MAG-OX) 400 (240 Mg) MG tablet Take 400 mg by mouth daily.   methenamine (HIPREX) 1 g tablet Take 1 g by mouth daily.   metolazone (ZAROXOLYN) 2.5 MG tablet Take 1 tablet (2.5 mg total) by mouth daily.   metoprolol tartrate (LOPRESSOR) 25 MG tablet Take 100 mg by mouth 2 (two) times daily.   montelukast (SINGULAIR) 10 MG tablet Take 10 mg by mouth at bedtime.   NIACINAMIDE PO Take 1,000 mg by mouth daily.   omeprazole (PRILOSEC) 40 MG capsule Take 40 mg by mouth 2 (two) times daily.     potassium chloride (KLOR-CON) 10 MEQ tablet Take 10 mEq by mouth 2 (two) times daily.   pravastatin (PRAVACHOL) 20 MG tablet Take 20 mg by mouth daily.   rivaroxaban (XARELTO) 20 MG TABS tablet Take 1 tablet (20 mg total) by mouth daily with supper.   vitamin C (ASCORBIC ACID) 500 MG tablet Take 1,000 mg by mouth daily.   Zinc Acetate 50 MG CAPS Take 1 capsule by mouth daily.     Allergies:   Eliquis [apixaban], Losartan, Cephalexin, Clarithromycin, Iodine, Levofloxacin, Povidone-iodine, Ramipril, Tequin [gatifloxacin], Atorvastatin, Bee venom, Iodinated contrast media, and Kiwi extract   Social History   Socioeconomic History   Marital status: Married    Spouse name: Not on file   Number of children: Not on file   Years of education: Not on file   Highest education level: Not on file  Occupational History   Occupation: housewife    Employer: RETIRED  Tobacco Use   Smoking status: Never    Passive exposure: Past   Smokeless tobacco: Former    Types: Snuff  Substance and Sexual Activity   Alcohol use: No   Drug use: No   Sexual activity: Not on file  Other Topics Concern   Not on file  Social History Narrative   Not on file   Social Determinants of Health   Financial Resource Strain: Not on file  Food Insecurity: Not on file  Transportation Needs: Not on file  Physical Activity: Not on file  Stress: Not on file  Social  Connections: Not on file     Family History: The patient's family history includes Heart attack in her father and mother; Other in her brother and sister; Stroke in her father.  ROS:   Please see the history of present illness.    All other systems reviewed and are negative.  EKGs/Labs/Other Studies Reviewed:    The following studies were reviewed today: I discussed my findings with the patient at length   Recent Labs: 02/28/2023: ALT 26; BUN 21; Creatinine, Ser 0.93; Potassium 3.9; Sodium 139  Recent Lipid Panel    Component Value Date/Time   CHOL 174 02/28/2023 0916  TRIG 147 02/28/2023 0916   HDL 41 02/28/2023 0916   CHOLHDL 4.2 02/28/2023 0916   LDLCALC 107 (H) 02/28/2023 0916    Physical Exam:    VS:  BP 138/78   Pulse 72   Ht 5\' 5"  (1.651 m)   Wt 176 lb 9.6 oz (80.1 kg)   SpO2 97%   BMI 29.39 kg/m     Wt Readings from Last 3 Encounters:  03/07/23 176 lb 9.6 oz (80.1 kg)  03/07/23 176 lb 12.8 oz (80.2 kg)  02/28/23 182 lb 6.4 oz (82.7 kg)     GEN: Patient is in no acute distress HEENT: Normal NECK: No JVD; No carotid bruits LYMPHATICS: No lymphadenopathy CARDIAC: Hear sounds irregular, 2/6 systolic murmur at the apex. RESPIRATORY:  Clear to auscultation without rales, wheezing or rhonchi  ABDOMEN: Soft, non-tender, non-distended MUSCULOSKELETAL:  No edema; No deformity  SKIN: Warm and dry NEUROLOGIC:  Alert and oriented x 3 PSYCHIATRIC:  Normal affect   Signed, Garwin Brothers, MD  03/07/2023 2:31 PM     [facility]

## 2023-03-07 NOTE — Patient Instructions (Addendum)
  1. Blood - CBC w/d, alpha-gal panel, shellfish panel, egg components, kiwi IgE  2. Can continue montelukast 10 mg - 1 tablet 1 time per day  3. Can use nasal ipratropium 0.06% - 2 sprays each nostril every 6 hours to dry nose  4. Further evaluation??? Yes, if recurrent reactions  5. Obtain flu vaccine

## 2023-03-07 NOTE — Patient Instructions (Signed)
Medication Instructions:  Your physician recommends that you continue on your current medications as directed. Please refer to the Current Medication list given to you today.  *If you need a refill on your cardiac medications before your next appointment, please call your pharmacy*   Lab Work: BMET, CBC, and LFT done today If you have labs (blood work) drawn today and your tests are completely normal, you will receive your results only by: MyChart Message (if you have MyChart) OR A paper copy in the mail If you have any lab test that is abnormal or we need to change your treatment, we will call you to review the results.   Testing/Procedures:    Dear Tina Curry  You are scheduled for a TEE (Transesophageal Echocardiogram) Guided Cardioversion  with Dr. Vincent Gros At [facility] and/or our office will call with the time and day of the procedure.  DIET:  Nothing to eat or drink after midnight except a sip of water with medications (see medication instructions below)  Continue taking your anticoagulant (blood thinner): Rivaroxaban (Xarelto).  You will need to continue this after your procedure until you are told by your provider that it is safe to stop.     LABS: Drawn 03-07-23  FYI:  For your safety, and to allow [place] to monitor your vital signs accurately during the surgery/procedure we request: If you have artificial nails, gel coating, SNS etc, please have those removed prior to your surgery/procedure. Not having the nail coverings /polish removed may result in cancellation or delay of your surgery/procedure.  Your support person will be asked to wait in the waiting room during your procedure.  It is OK to have someone drop you off and come back when you are ready to be discharged.  You cannot drive after the procedure and will need someone to drive you home.  Bring your insurance cards.  *Special Note: Every effort is made to have your procedure done on time.  Occasionally there are emergencies that occur at the hospital that may cause delays. Please be patient if a delay does occur.       Follow-Up: At [facility], you and your health needs are our priority.  As part of our continuing mission to provide you with exceptional heart care, we have created designated Provider Care Teams.  These Care Teams include your primary Cardiologist (physician) and Advanced Practice Providers (APPs -  Physician Assistants and Nurse Practitioners) who all work together to provide you with the care you need, when you need it.  We recommend signing up for the patient portal called "MyChart".  Sign up information is provided on this After Visit Summary.  MyChart is used to connect with patients for Virtual Visits (Telemedicine).  Patients are able to view lab/test results, encounter notes, upcoming appointments, etc.  Non-urgent messages can be sent to your provider as well.   To learn more about what you can do with MyChart, go to [email or web address].    Your next appointment:   2 week(s) after cardioversion at [facility] on 03-26-23 with Dr. Vincent Gros  Provider:   Belva Crome, MD    Other Instructions

## 2023-03-08 LAB — CBC
Hematocrit: 40.8 % (ref 34.0–46.6)
Hemoglobin: 13 g/dL (ref 11.1–15.9)
MCH: 26.7 pg (ref 26.6–33.0)
MCHC: 31.9 g/dL (ref 31.5–35.7)
MCV: 84 fL (ref 79–97)
Platelets: 457 10*3/uL — ABNORMAL HIGH (ref 150–450)
RBC: 4.86 x10E6/uL (ref 3.77–5.28)
RDW: 14.4 % (ref 11.7–15.4)
WBC: 7.9 10*3/uL (ref 3.4–10.8)

## 2023-03-08 LAB — BASIC METABOLIC PANEL
BUN/Creatinine Ratio: 17 (ref 12–28)
BUN: 18 mg/dL (ref 8–27)
CO2: 25 mmol/L (ref 20–29)
Calcium: 9.5 mg/dL (ref 8.7–10.3)
Chloride: 96 mmol/L (ref 96–106)
Creatinine, Ser: 1.06 mg/dL — ABNORMAL HIGH (ref 0.57–1.00)
Glucose: 124 mg/dL — ABNORMAL HIGH (ref 70–99)
Potassium: 3.7 mmol/L (ref 3.5–5.2)
Sodium: 137 mmol/L (ref 134–144)
eGFR: 52 mL/min/{1.73_m2} — ABNORMAL LOW (ref >59)

## 2023-03-08 LAB — HEPATIC FUNCTION PANEL
ALT: 24 [IU]/L (ref 0–32)
AST: 23 [IU]/L (ref 0–40)
Albumin: 3.7 g/dL (ref 3.7–4.7)
Alkaline Phosphatase: 100 [IU]/L (ref 44–121)
Bilirubin Total: 0.3 mg/dL (ref 0.0–1.2)
Bilirubin, Direct: 0.13 mg/dL (ref 0.00–0.40)
Total Protein: 6.4 g/dL (ref 6.0–8.5)

## 2023-03-11 ENCOUNTER — Encounter: Payer: Self-pay | Admitting: Allergy and Immunology

## 2023-03-11 ENCOUNTER — Other Ambulatory Visit: Payer: Self-pay

## 2023-03-11 DIAGNOSIS — N3001 Acute cystitis with hematuria: Secondary | ICD-10-CM | POA: Diagnosis not present

## 2023-03-11 DIAGNOSIS — I480 Paroxysmal atrial fibrillation: Secondary | ICD-10-CM | POA: Diagnosis not present

## 2023-03-11 DIAGNOSIS — Z6828 Body mass index (BMI) 28.0-28.9, adult: Secondary | ICD-10-CM | POA: Diagnosis not present

## 2023-03-11 DIAGNOSIS — Z889 Allergy status to unspecified drugs, medicaments and biological substances status: Secondary | ICD-10-CM | POA: Diagnosis not present

## 2023-03-11 DIAGNOSIS — D6869 Other thrombophilia: Secondary | ICD-10-CM | POA: Diagnosis not present

## 2023-03-11 DIAGNOSIS — I4891 Unspecified atrial fibrillation: Secondary | ICD-10-CM | POA: Diagnosis not present

## 2023-03-11 DIAGNOSIS — S81811D Laceration without foreign body, right lower leg, subsequent encounter: Secondary | ICD-10-CM | POA: Diagnosis not present

## 2023-03-11 DIAGNOSIS — I10 Essential (primary) hypertension: Secondary | ICD-10-CM | POA: Diagnosis not present

## 2023-03-11 DIAGNOSIS — N1832 Chronic kidney disease, stage 3b: Secondary | ICD-10-CM | POA: Diagnosis not present

## 2023-03-11 MED ORDER — METOPROLOL TARTRATE 25 MG PO TABS: 100.0000 mg | ORAL_TABLET | Freq: Two times a day (BID) | ORAL | 3 refills | Status: DC

## 2023-03-12 ENCOUNTER — Telehealth: Payer: Self-pay | Admitting: Cardiology

## 2023-03-12 ENCOUNTER — Encounter: Payer: Self-pay | Admitting: *Deleted

## 2023-03-12 LAB — ALLERGEN PROFILE, SHELLFISH
Clam IgE: 0.26 kU/L — AB
F023-IgE Crab: <0.1 kU/L
F080-IgE Lobster: <0.1 kU/L
F290-IgE Oyster: <0.1 kU/L
Scallop IgE: <0.1 kU/L
Shrimp IgE: <0.1 kU/L

## 2023-03-12 LAB — ALLERGEN, KIWI FRUIT, F84: Kiwi Fruit: <0.1 kU/L

## 2023-03-12 LAB — CBC WITH DIFFERENTIAL/PLATELET
Basophils Absolute: 0.1 10*3/uL (ref 0.0–0.2)
Basos: 1 %
EOS (ABSOLUTE): 0.2 10*3/uL (ref 0.0–0.4)
Eos: 3 %
Hematocrit: 38.7 % (ref 34.0–46.6)
Hemoglobin: 12.5 g/dL (ref 11.1–15.9)
Immature Grans (Abs): 0 10*3/uL (ref 0.0–0.1)
Immature Granulocytes: 1 %
Lymphocytes Absolute: 1.9 10*3/uL (ref 0.7–3.1)
Lymphs: 24 %
MCH: 26.7 pg (ref 26.6–33.0)
MCHC: 32.3 g/dL (ref 31.5–35.7)
MCV: 83 fL (ref 79–97)
Monocytes Absolute: 1.1 10*3/uL — ABNORMAL HIGH (ref 0.1–0.9)
Monocytes: 14 %
Neutrophils Absolute: 4.7 10*3/uL (ref 1.4–7.0)
Neutrophils: 57 %
Platelets: 420 10*3/uL (ref 150–450)
RBC: 4.68 x10E6/uL (ref 3.77–5.28)
RDW: 14.2 % (ref 11.7–15.4)
WBC: 8 10*3/uL (ref 3.4–10.8)

## 2023-03-12 LAB — EGG COMPONENT PANEL
F232-IgE Ovalbumin: <0.1 kU/L
F233-IgE Ovomucoid: <0.1 kU/L

## 2023-03-12 LAB — ALPHA-GAL PANEL
Allergen Lamb IgE: 4.38 kU/L — AB
Beef IgE: 7.37 kU/L — AB
IgE (Immunoglobulin E), Serum: 175 [IU]/mL (ref 6–495)
O215-IgE Alpha-Gal: 23.5 kU/L — AB
Pork IgE: 1.39 kU/L — AB

## 2023-03-12 NOTE — Telephone Encounter (Signed)
Katie from [facility] called and stated patient is scheduled for cardioversion/TEE on 03/25/23 at 9:00 AM.  Please advise.

## 2023-03-19 ENCOUNTER — Telehealth: Payer: Self-pay

## 2023-03-19 DIAGNOSIS — N390 Urinary tract infection, site not specified: Secondary | ICD-10-CM | POA: Diagnosis not present

## 2023-03-19 DIAGNOSIS — I2510 Atherosclerotic heart disease of native coronary artery without angina pectoris: Secondary | ICD-10-CM

## 2023-03-19 DIAGNOSIS — E785 Hyperlipidemia, unspecified: Secondary | ICD-10-CM

## 2023-03-19 DIAGNOSIS — I358 Other nonrheumatic aortic valve disorders: Secondary | ICD-10-CM

## 2023-03-19 MED ORDER — PRAVASTATIN SODIUM 40 MG PO TABS: 40.0000 mg | ORAL_TABLET | Freq: Every day | ORAL | 3 refills | Status: AC

## 2023-03-19 NOTE — Telephone Encounter (Signed)
-----   Message from [place] R Revankar sent at 03/19/2023  9:00 AM EST ----- Diet, double statin and liver lipid check in 6 weeks.  Copy primary care Garwin Brothers, MD 03/19/2023 8:59 AM

## 2023-03-19 NOTE — Addendum Note (Signed)
Addended by: Eleonore Chiquito on: 03/19/2023 02:33 PM   Modules accepted: Orders

## 2023-03-19 NOTE — Telephone Encounter (Signed)
Left vm to return call.    

## 2023-03-25 DIAGNOSIS — I361 Nonrheumatic tricuspid (valve) insufficiency: Secondary | ICD-10-CM | POA: Diagnosis not present

## 2023-03-25 DIAGNOSIS — I10 Essential (primary) hypertension: Secondary | ICD-10-CM | POA: Diagnosis not present

## 2023-03-25 DIAGNOSIS — I371 Nonrheumatic pulmonary valve insufficiency: Secondary | ICD-10-CM | POA: Diagnosis not present

## 2023-03-25 DIAGNOSIS — I440 Atrioventricular block, first degree: Secondary | ICD-10-CM | POA: Diagnosis not present

## 2023-03-25 DIAGNOSIS — I340 Nonrheumatic mitral (valve) insufficiency: Secondary | ICD-10-CM | POA: Diagnosis not present

## 2023-03-25 DIAGNOSIS — I4891 Unspecified atrial fibrillation: Secondary | ICD-10-CM | POA: Diagnosis not present

## 2023-03-26 DIAGNOSIS — Z6828 Body mass index (BMI) 28.0-28.9, adult: Secondary | ICD-10-CM | POA: Diagnosis not present

## 2023-03-26 DIAGNOSIS — S81811D Laceration without foreign body, right lower leg, subsequent encounter: Secondary | ICD-10-CM | POA: Diagnosis not present

## 2023-04-03 DIAGNOSIS — S81811A Laceration without foreign body, right lower leg, initial encounter: Secondary | ICD-10-CM | POA: Diagnosis not present

## 2023-04-04 ENCOUNTER — Other Ambulatory Visit: Payer: Self-pay

## 2023-04-04 DIAGNOSIS — G039 Meningitis, unspecified: Secondary | ICD-10-CM | POA: Insufficient documentation

## 2023-04-04 DIAGNOSIS — S81811A Laceration without foreign body, right lower leg, initial encounter: Secondary | ICD-10-CM | POA: Diagnosis not present

## 2023-04-09 ENCOUNTER — Encounter: Payer: Self-pay | Admitting: Cardiology

## 2023-04-09 ENCOUNTER — Ambulatory Visit: Payer: PPO

## 2023-04-09 ENCOUNTER — Ambulatory Visit: Payer: PPO | Attending: Cardiology | Admitting: Cardiology

## 2023-04-09 VITALS — BP 140/74 | HR 74 | Ht 65.0 in | Wt 176.4 lb

## 2023-04-09 DIAGNOSIS — G4733 Obstructive sleep apnea (adult) (pediatric): Secondary | ICD-10-CM | POA: Diagnosis not present

## 2023-04-09 DIAGNOSIS — I10 Essential (primary) hypertension: Secondary | ICD-10-CM

## 2023-04-09 DIAGNOSIS — E785 Hyperlipidemia, unspecified: Secondary | ICD-10-CM | POA: Diagnosis not present

## 2023-04-09 DIAGNOSIS — I2510 Atherosclerotic heart disease of native coronary artery without angina pectoris: Secondary | ICD-10-CM | POA: Diagnosis not present

## 2023-04-09 DIAGNOSIS — I480 Paroxysmal atrial fibrillation: Secondary | ICD-10-CM | POA: Diagnosis not present

## 2023-04-09 MED ORDER — AMLODIPINE BESYLATE 2.5 MG PO TABS: 2.5000 mg | ORAL_TABLET | Freq: Every day | ORAL | 3 refills | Status: DC

## 2023-04-09 NOTE — Progress Notes (Signed)
Cardiology Office Note:    Date:  04/09/2023   ID:  Tina Curry, DOB May 01, 1940, MRN [id number]  PCP:  Street, Tina Coup, MD  Cardiologist:  Garwin Brothers, MD   Referring MD: [address], Tina Curry, *    ASSESSMENT:    1. Essential hypertension   2. Paroxysmal atrial fibrillation (HCC)   3. Coronary artery disease involving native coronary artery of native heart without angina pectoris   4. OSA (obstructive sleep apnea)   5. Dyslipidemia    PLAN:    In order of problems listed above:  Primary prevention stressed with the patient.  Importance of compliance with diet medication stressed and patient verbalized standing. She was advised to be active and ambulate to the best of her ability. Paroxysmal atrial fibrillation: Post TEE guided cardioversion: This was successful.  She is in sinus rhythm.  Because of multiple medications that she is on and history of weakness will do blood work.I discussed with the patient atrial fibrillation, disease process. Management and therapy including rate and rhythm control, anticoagulation benefits and potential risks were discussed extensively with the patient. Patient had multiple questions which were answered to patient's satisfaction. Elevated blood pressure: Essential hypertension: I introduced amlodipine 2.5 mg daily.  She has brought blood pressure readings from home and they are similar.  She will be back in 1 week with blood pressure log. Mixed dyslipidemia: On lipid-lowering medications followed by primary care. Patient will be seen in follow-up appointment in 6 months or earlier if the patient has any concerns.    Medication Adjustments/Labs and Tests Ordered: Current medicines are reviewed at length with the patient today.  Concerns regarding medicines are outlined above.  Orders Placed This Encounter  Procedures   EKG 12-Lead   No orders of the defined types were placed in this encounter.    No chief complaint on file.     History of Present Illness:    Tina Curry is a 82 y.o. female.  Patient has past medical history of essential hypertension and mixed dyslipidemia and paroxysmal atrial fibrillation.  She underwent TEE guided cardioversion.  Subsequently she feels much better.  No chest pain orthopnea or PND.  At the time of my evaluation, the patient is alert awake oriented and in no distress.  She gives history of some weakness.  Her family accompanies her for this visit.  Past Medical History:  Diagnosis Date   Aortic valve sclerosis 05/12/2021   Atrial fibrillation (HCC) 02/21/2023   Chronic cough 11/20/2021   Coronary artery disease 03/07/2023   Dyslipidemia 12/26/2016   Episodic lightheadedness 08/26/2017   Essential hypertension 07/13/2009   Qualifier: Diagnosis of   By: Roxan Hockey CMA, Milas Kocher SNOMED Dx Update Oct 2024     Goiter 12/15/2009   Qualifier: Diagnosis of   By: Vassie Loll MD, Comer Locket      IMO SNOMED Dx Update Oct 2024     Leg pain, bilateral 01/15/2018   Malaise and fatigue 11/03/2018   Meningitis    Murmur 03/06/2017   OSA (obstructive sleep apnea) 11/08/2011   PSG 10/12 mild, 9/h     Other and unspecified hyperlipidemia    Other chest pain 05/12/2021   Palpitations 12/26/2016   Paroxysmal atrial fibrillation (HCC) 03/07/2023   Peripheral vascular disease (HCC) 12/26/2016   Viral upper respiratory tract infection 12/26/2016    Past Surgical History:  Procedure Laterality Date   BACK SURGERY  1990 and 2001   bladdr tack  03/06/2004   CATARACT EXTRACTION Left 2011   CATARACT EXTRACTION Right 2017   CHOLECYSTECTOMY  02/18/2009   EYE SURGERY     left   FOOT SURGERY Left 1992   left   TUBAL LIGATION  04/23/1972   VESICOVAGINAL FISTULA CLOSURE W/ TAH  04/23/1992   VITRECTOMY Left 2010   left eye    Current Medications: Current Meds  Medication Sig   Cholecalciferol (D3) 25 MCG (1000 UT) capsule Take 1,000 Units by mouth daily.   cloNIDine (CATAPRES) 0.2 MG tablet  Take 0.2 mg by mouth daily.   cyanocobalamin 1000 MCG tablet Take 2,500 mcg by mouth daily.   EPINEPHrine 0.3 mg/0.3 mL IJ SOAJ injection Inject 0.3 mg into the muscle as needed for anaphylaxis.   ipratropium (ATROVENT) 0.06 % nasal spray Can use two sprays in each nostril every six hours to dry up runny nose.   magnesium oxide (MAG-OX) 400 (240 Mg) MG tablet Take 400 mg by mouth daily.   methenamine (HIPREX) 1 g tablet Take 1 g by mouth daily.   metolazone (ZAROXOLYN) 2.5 MG tablet Take 1 tablet (2.5 mg total) by mouth daily.   metoprolol tartrate (LOPRESSOR) 25 MG tablet Take 4 tablets (100 mg total) by mouth 2 (two) times daily.   montelukast (SINGULAIR) 10 MG tablet Take 10 mg by mouth at bedtime.   NIACINAMIDE PO Take 1,000 mg by mouth daily.   omeprazole (PRILOSEC) 40 MG capsule Take 40 mg by mouth 2 (two) times daily.     potassium chloride (KLOR-CON M) 10 MEQ tablet Take 10 mEq by mouth daily.   potassium chloride (KLOR-CON) 10 MEQ tablet Take 10 mEq by mouth 2 (two) times daily.   pravastatin (PRAVACHOL) 40 MG tablet Take 1 tablet (40 mg total) by mouth daily.   predniSONE (DELTASONE) 20 MG tablet Take 20 mg by mouth 2 (two) times daily.   rivaroxaban (XARELTO) 20 MG TABS tablet Take 1 tablet (20 mg total) by mouth daily with supper.   SANTYL 250 UNIT/GM ointment Apply 1 Application topically daily.   vitamin C (ASCORBIC ACID) 500 MG tablet Take 1,000 mg by mouth daily.   Zinc Acetate 50 MG CAPS Take 1 capsule by mouth daily.     Allergies:   Eliquis [apixaban], Losartan, Cephalexin, Clarithromycin, Iodine, Levofloxacin, Povidone-iodine, Ramipril, Tequin [gatifloxacin], Atorvastatin, Bee venom, Iodinated contrast media, and Kiwi extract   Social History   Socioeconomic History   Marital status: Married    Spouse name: Not on file   Number of children: Not on file   Years of education: Not on file   Highest education level: Not on file  Occupational History   Occupation:  housewife    Employer: RETIRED  Tobacco Use   Smoking status: Never    Passive exposure: Past   Smokeless tobacco: Former    Types: Snuff  Substance and Sexual Activity   Alcohol use: No   Drug use: No   Sexual activity: Not on file  Other Topics Concern   Not on file  Social History Narrative   Not on file   Social Drivers of Health   Financial Resource Strain: Not on file  Food Insecurity: Not on file  Transportation Needs: Not on file  Physical Activity: Not on file  Stress: Not on file  Social Connections: Not on file     Family History: The patient's family history includes Heart attack in her father and mother; Other in her brother and sister; Stroke in  her father.  ROS:   Please see the history of present illness.    All other systems reviewed and are negative.  EKGs/Labs/Other Studies Reviewed:    The following studies were reviewed today:    .Marland KitchenEKG Interpretation Date/Time:  Tuesday April 09 2023 16:50:08 EST Ventricular Rate:  74 PR Interval:  258 QRS Duration:  68 QT Interval:  392 QTC Calculation: 435 R Axis:   -9  Text Interpretation: Sinus rhythm with sinus arrhythmia with 1st degree A-V block Low voltage QRS Possible Anterolateral infarct (cited on or before 21-Feb-2023) Abnormal ECG When compared with ECG of 21-Feb-2023 10:01, Sinus rhythm has replaced Atrial fibrillation Questionable change in initial forces of Lateral leads Confirmed by Belva Crome [phone]) on 04/09/2023 4:19:24 PM     Recent Labs: 03/07/2023: ALT 24; BUN 18; Creatinine, Ser 1.06; Hemoglobin 13.0; Platelets 457; Potassium 3.7; Sodium 137  Recent Lipid Panel    Component Value Date/Time   CHOL 174 02/28/2023 0916   TRIG 147 02/28/2023 0916   HDL 41 02/28/2023 0916   CHOLHDL 4.2 02/28/2023 0916   LDLCALC 107 (H) 02/28/2023 0916    Physical Exam:    VS:  There were no vitals taken for this visit.    Wt Readings from Last 3 Encounters:  03/07/23 176 lb 9.6 oz (80.1  kg)  03/07/23 176 lb 12.8 oz (80.2 kg)  02/28/23 182 lb 6.4 oz (82.7 kg)     GEN: Patient is in no acute distress HEENT: Normal NECK: No JVD; No carotid bruits LYMPHATICS: No lymphadenopathy CARDIAC: Hear sounds regular, 2/6 systolic murmur at the apex. RESPIRATORY:  Clear to auscultation without rales, wheezing or rhonchi  ABDOMEN: Soft, non-tender, non-distended MUSCULOSKELETAL:  No edema; No deformity  SKIN: Warm and dry NEUROLOGIC:  Alert and oriented x 3 PSYCHIATRIC:  Normal affect   Signed, Garwin Brothers, MD  04/09/2023 3:46 PM    [facility]

## 2023-04-09 NOTE — Patient Instructions (Addendum)
Medication Instructions:  Your physician has recommended you make the following change in your medication:   Start Norvasc 2.5 mg daily  *If you need a refill on your cardiac medications before your next appointment, please call your pharmacy*   Lab Work: Your physician recommends that you have a CMP and magnesium today in the office.  If you have labs (blood work) drawn today and your tests are completely normal, you will receive your results only by: MyChart Message (if you have MyChart) OR A paper copy in the mail If you have any lab test that is abnormal or we need to change your treatment, we will call you to review the results.   Testing/Procedures: None ordered   Follow-Up: At [facility], you and your health needs are our priority.  As part of our continuing mission to provide you with exceptional heart care, we have created designated Provider Care Teams.  These Care Teams include your primary Cardiologist (physician) and Advanced Practice Providers (APPs -  Physician Assistants and Nurse Practitioners) who all work together to provide you with the care you need, when you need it.  We recommend signing up for the patient portal called "MyChart".  Sign up information is provided on this After Visit Summary.  MyChart is used to connect with patients for Virtual Visits (Telemedicine).  Patients are able to view lab/test results, encounter notes, upcoming appointments, etc.  Non-urgent messages can be sent to your provider as well.   To learn more about what you can do with MyChart, go to [email or web address].    Your next appointment:   3 month(s)  The format for your next appointment:   In Person  Provider:   Belva Crome, MD    Other Instructions Amlodipine Tablets What is this medication? AMLODIPINE (am LOE di peen) treats high blood pressure and prevents chest pain (angina). It works by relaxing the blood vessels, which helps decrease the amount of work  your heart has to do. It belongs to a group of medications called calcium channel blockers. This medicine may be used for other purposes; ask your health care provider or pharmacist if you have questions. COMMON BRAND NAME(S): Norvasc What should I tell my care team before I take this medication? They need to know if you have any of these conditions: Heart disease Liver disease An unusual or allergic reaction to amlodipine, other medications, foods, dyes, or preservatives Pregnant or trying to get pregnant Breastfeeding How should I use this medication? Take this medication by mouth. Take it as directed on the prescription label at the same time every day. You can take it with or without food. If it upsets your stomach, take it with food. Keep taking it unless your care team tells you to stop. Talk to your care team about the use of this medication in children. While it may be prescribed for children as young as 6 for selected conditions, precautions do apply. Overdosage: If you think you have taken too much of this medicine contact a poison control center or emergency room at once. NOTE: This medicine is only for you. Do not share this medicine with others. What if I miss a dose? If you miss a dose, take it as soon as you can. If it is almost time for your next dose, take only that dose. Do not take double or extra doses. What may interact with this medication? Clarithromycin Cyclosporine Diltiazem Itraconazole Simvastatin Tacrolimus This list may not describe all possible interactions. Give  your health care provider a list of all the medicines, herbs, non-prescription drugs, or dietary supplements you use. Also tell them if you smoke, drink alcohol, or use illegal drugs. Some items may interact with your medicine. What should I watch for while using this medication? Visit your care team for regular checks on your progress. Check your blood pressure as directed. Know what your blood  pressure should be and when to contact your care team. Do not treat yourself for coughs, colds, or pain while you are using this medication without asking your care team for advice. Some medications may increase your blood pressure. This medication may affect your coordination, reaction time, or judgment. Do not drive or operate machinery until you know how this medication affects you. Sit up or stand slowly to reduce the risk of dizzy or fainting spells. Drinking alcohol with this medication can increase the risk of these side effects. What side effects may I notice from receiving this medication? Side effects that you should report to your care team as soon as possible: Allergic reactions--skin rash, itching, hives, swelling of the face, lips, tongue, or throat Heart attack--pain or tightness in the chest, shoulders, arms, or jaw, nausea, shortness of breath, cold or clammy skin, feeling faint or lightheaded Low blood pressure--dizziness, feeling faint or lightheaded, blurry vision Worsening chest pain (angina)--pain, pressure, or tightness in the chest, neck, back, or arms Side effects that usually do not require medical attention (report these to your care team if they continue or are bothersome): Facial flushing, redness Heart palpitations--rapid, pounding, or irregular heartbeat Nausea Stomach pain Swelling of the ankles, hands, or feet This list may not describe all possible side effects. Call your doctor for medical advice about side effects. You may report side effects to FDA at 1-800-FDA-1088. Where should I keep my medication? Keep out of the reach of children and pets. Store at room temperature between 20 and 25 degrees C (68 and 77 degrees F). Protect from light and moisture. Keep the container tightly closed. Get rid of any unused medication after the expiration date. To get rid of medications that are no longer needed or have expired: Take the medication to a medication take-back  program. Check with your pharmacy or law enforcement to find a location. If you cannot return the medication, check the label or package insert to see if the medication should be thrown out in the garbage or flushed down the toilet. If you are not sure, ask your care team. If it is safe to put in the trash, empty the medication out of the container. Mix the medication with cat litter, dirt, coffee grounds, or other unwanted substance. Seal the mixture in a bag or container. Put it in the trash. NOTE: This sheet is a summary. It may not cover all possible information. If you have questions about this medicine, talk to your doctor, pharmacist, or health care provider.  2024 Elsevier/Gold Standard (2021-10-30 00:00:00)   Important Information About Sugar

## 2023-04-10 DIAGNOSIS — S81811A Laceration without foreign body, right lower leg, initial encounter: Secondary | ICD-10-CM | POA: Diagnosis not present

## 2023-04-10 DIAGNOSIS — M25512 Pain in left shoulder: Secondary | ICD-10-CM | POA: Diagnosis not present

## 2023-04-10 DIAGNOSIS — M7542 Impingement syndrome of left shoulder: Secondary | ICD-10-CM | POA: Diagnosis not present

## 2023-04-10 LAB — COMPREHENSIVE METABOLIC PANEL
ALT: 11 [IU]/L (ref 0–32)
AST: 20 [IU]/L (ref 0–40)
Albumin: 3.5 g/dL — ABNORMAL LOW (ref 3.7–4.7)
Alkaline Phosphatase: 127 [IU]/L — ABNORMAL HIGH (ref 44–121)
BUN/Creatinine Ratio: 15 (ref 12–28)
BUN: 11 mg/dL (ref 8–27)
Bilirubin Total: 0.4 mg/dL (ref 0.0–1.2)
CO2: 28 mmol/L (ref 20–29)
Calcium: 9.1 mg/dL (ref 8.7–10.3)
Chloride: 93 mmol/L — ABNORMAL LOW (ref 96–106)
Creatinine, Ser: 0.75 mg/dL (ref 0.57–1.00)
Globulin, Total: 2.6 g/dL (ref 1.5–4.5)
Glucose: 89 mg/dL (ref 70–99)
Potassium: 3.3 mmol/L — ABNORMAL LOW (ref 3.5–5.2)
Sodium: 136 mmol/L (ref 134–144)
Total Protein: 6.1 g/dL (ref 6.0–8.5)
eGFR: 79 mL/min/{1.73_m2} (ref >59)

## 2023-04-10 LAB — MAGNESIUM: Magnesium: 1.8 mg/dL (ref 1.6–2.3)

## 2023-04-11 ENCOUNTER — Telehealth: Payer: Self-pay

## 2023-04-11 DIAGNOSIS — E876 Hypokalemia: Secondary | ICD-10-CM

## 2023-04-11 MED ORDER — POTASSIUM CHLORIDE ER 10 MEQ PO TBCR: 20.0000 meq | EXTENDED_RELEASE_TABLET | Freq: Two times a day (BID) | ORAL | Status: DC

## 2023-04-11 NOTE — Telephone Encounter (Signed)
-----   Message from [place] R Revankar sent at 04/10/2023  8:45 AM EST ----- Double up on potassium dose and recheck in 1 week.  Copy primary Garwin Brothers, MD 04/10/2023 8:45 AM

## 2023-04-19 DIAGNOSIS — E876 Hypokalemia: Secondary | ICD-10-CM | POA: Diagnosis not present

## 2023-04-20 LAB — BASIC METABOLIC PANEL
BUN/Creatinine Ratio: 20 (ref 12–28)
BUN: 16 mg/dL (ref 8–27)
CO2: 27 mmol/L (ref 20–29)
Calcium: 9.4 mg/dL (ref 8.7–10.3)
Chloride: 99 mmol/L (ref 96–106)
Creatinine, Ser: 0.82 mg/dL (ref 0.57–1.00)
Glucose: 86 mg/dL (ref 70–99)
Potassium: 3.8 mmol/L (ref 3.5–5.2)
Sodium: 140 mmol/L (ref 134–144)
eGFR: 71 mL/min/{1.73_m2} (ref >59)

## 2023-04-22 ENCOUNTER — Encounter: Payer: Self-pay | Admitting: Allergy and Immunology

## 2023-04-22 ENCOUNTER — Ambulatory Visit: Payer: PPO | Admitting: Allergy and Immunology

## 2023-04-22 VITALS — BP 150/76 | HR 76 | Resp 18

## 2023-04-22 DIAGNOSIS — J310 Chronic rhinitis: Secondary | ICD-10-CM

## 2023-04-22 DIAGNOSIS — T781XXD Other adverse food reactions, not elsewhere classified, subsequent encounter: Secondary | ICD-10-CM

## 2023-04-22 NOTE — Patient Instructions (Addendum)
  1.  Avoid mammal meat consumption, seafood consumption  2. Can continue montelukast 10 mg - 1 tablet 1 time per day  3. Can use nasal ipratropium 0.06% - 2 sprays each nostril every 6 hours to dry nose  4. Can use Epi-pen, benadryl, MD/ER evaluation for allergic reaction  5. Obtain flu vaccine  6. Return to clinic in 1 year or earlier if problem

## 2023-04-23 ENCOUNTER — Telehealth: Payer: Self-pay

## 2023-04-23 ENCOUNTER — Encounter: Payer: Self-pay | Admitting: Allergy and Immunology

## 2023-04-23 NOTE — Telephone Encounter (Signed)
 Left message for patient to call back

## 2023-04-23 NOTE — Telephone Encounter (Signed)
-----   Message from Aundra Dubin Revankar sent at 04/20/2023  2:20 PM EST ----- She needs to get potassium checked by primary care in the next couple of weeks and follow-up with primary care for this. Garwin Brothers, MD 04/20/2023 2:19 PM

## 2023-04-25 DIAGNOSIS — Z23 Encounter for immunization: Secondary | ICD-10-CM | POA: Diagnosis not present

## 2023-04-25 DIAGNOSIS — S81811A Laceration without foreign body, right lower leg, initial encounter: Secondary | ICD-10-CM | POA: Diagnosis not present

## 2023-04-30 DIAGNOSIS — E785 Hyperlipidemia, unspecified: Secondary | ICD-10-CM | POA: Diagnosis not present

## 2023-04-30 DIAGNOSIS — I2510 Atherosclerotic heart disease of native coronary artery without angina pectoris: Secondary | ICD-10-CM | POA: Diagnosis not present

## 2023-04-30 DIAGNOSIS — I358 Other nonrheumatic aortic valve disorders: Secondary | ICD-10-CM | POA: Diagnosis not present

## 2023-05-01 LAB — HEPATIC FUNCTION PANEL
ALT: 15 [IU]/L (ref 0–32)
AST: 15 [IU]/L (ref 0–40)
Albumin: 3.4 g/dL — ABNORMAL LOW (ref 3.7–4.7)
Alkaline Phosphatase: 97 [IU]/L (ref 44–121)
Bilirubin Total: 0.3 mg/dL (ref 0.0–1.2)
Bilirubin, Direct: 0.1 mg/dL (ref 0.00–0.40)
Total Protein: 5.9 g/dL — ABNORMAL LOW (ref 6.0–8.5)

## 2023-05-01 LAB — LIPID PANEL
Chol/HDL Ratio: 3.6 {ratio} (ref 0.0–4.4)
Cholesterol, Total: 199 mg/dL (ref 100–199)
HDL: 55 mg/dL (ref >39)
LDL Chol Calc (NIH): 113 mg/dL — ABNORMAL HIGH (ref 0–99)
Triglycerides: 181 mg/dL — ABNORMAL HIGH (ref 0–149)
VLDL Cholesterol Cal: 31 mg/dL (ref 5–40)

## 2023-05-02 DIAGNOSIS — S81811A Laceration without foreign body, right lower leg, initial encounter: Secondary | ICD-10-CM | POA: Diagnosis not present

## 2023-05-16 DIAGNOSIS — S81811A Laceration without foreign body, right lower leg, initial encounter: Secondary | ICD-10-CM | POA: Diagnosis not present

## 2023-05-24 DIAGNOSIS — N390 Urinary tract infection, site not specified: Secondary | ICD-10-CM | POA: Diagnosis not present

## 2023-05-28 ENCOUNTER — Telehealth: Payer: Self-pay | Admitting: Cardiology

## 2023-05-28 DIAGNOSIS — Z91018 Allergy to other foods: Secondary | ICD-10-CM | POA: Insufficient documentation

## 2023-05-28 NOTE — Telephone Encounter (Signed)
Pt states that she has been weak, no energy and feels like her heartbeat is "off". She says that she has been feeling like this for the last week. Pt would like a c/b regarding this matter. Please advise

## 2023-05-28 NOTE — Telephone Encounter (Signed)
Spoke with pt who reports that she is having increased fatigue, weakness and irregular heart rate. Appointment made with Dr. Vincent Gros for 2/5

## 2023-05-29 ENCOUNTER — Ambulatory Visit: Payer: PPO

## 2023-05-29 VITALS — BP 146/78 | HR 54 | Ht 65.0 in | Wt 180.2 lb

## 2023-05-29 DIAGNOSIS — I4819 Other persistent atrial fibrillation: Secondary | ICD-10-CM | POA: Diagnosis not present

## 2023-05-29 DIAGNOSIS — I2510 Atherosclerotic heart disease of native coronary artery without angina pectoris: Secondary | ICD-10-CM

## 2023-05-29 DIAGNOSIS — I480 Paroxysmal atrial fibrillation: Secondary | ICD-10-CM | POA: Diagnosis not present

## 2023-05-29 DIAGNOSIS — R002 Palpitations: Secondary | ICD-10-CM | POA: Diagnosis not present

## 2023-05-29 MED ORDER — METOPROLOL TARTRATE 25 MG PO TABS: 50.0000 mg | ORAL_TABLET | Freq: Two times a day (BID) | ORAL | Status: AC

## 2023-05-29 NOTE — Patient Instructions (Signed)
 Medication Instructions:  Your physician has recommended you make the following change in your medication:   Decrease your Metoprolol  to 50 mg twice daily. (Take 2 tablets twice daily)  *If you need a refill on your cardiac medications before your next appointment, please call your pharmacy*   Lab Work: Your physician recommends that you have a CBC, TSH with free T3/T4 today in the office.  If you have labs (blood work) drawn today and your tests are completely normal, you will receive your results only by: MyChart Message (if you have MyChart) OR A paper copy in the mail If you have any lab test that is abnormal or we need to change your treatment, we will call you to review the results.   Testing/Procedures: A zio monitor was ordered today. It will remain on for 7 days. Remove 06/05/23. You will then return monitor and event diary in provided box. It takes 1-2 weeks for report to be downloaded and returned to us . We will call you with the results. If monitor falls off or has orange flashing light, please call Zio for further instructions.    Follow-Up: At [facility], you and your health needs are our priority.  As part of our continuing mission to provide you with exceptional heart care, we have created designated Provider Care Teams.  These Care Teams include your primary Cardiologist (physician) and Advanced Practice Providers (APPs -  Physician Assistants and Nurse Practitioners) who all work together to provide you with the care you need, when you need it.  We recommend signing up for the patient portal called MyChart.  Sign up information is provided on this After Visit Summary.  MyChart is used to connect with patients for Virtual Visits (Telemedicine).  Patients are able to view lab/test results, encounter notes, upcoming appointments, etc.  Non-urgent messages can be sent to your provider as well.   To learn more about what you can do with MyChart, go to  [email or web address].    Your next appointment:   Keep appointment as scheduled with Dr. Edwyna  The format for your next appointment:   In Person  Provider:   Jennifer Edwyna, MD    Other Instructions Low-Sodium Eating Plan Salt (sodium) helps you keep a healthy balance of fluids in your body. Too much sodium can raise your blood pressure. It can also cause fluid and waste to be held in your body. Your health care provider or dietitian may recommend a low-sodium eating plan if you have high blood pressure (hypertension), kidney disease, liver disease, or heart failure. Eating less sodium can help lower your blood pressure and reduce swelling. It can also protect your heart, liver, and kidneys. What are tips for following this plan? Reading food labels  Check food labels for the amount of sodium per serving. If you eat more than one serving, you must multiply the listed amount by the number of servings. Choose foods with less than 140 milligrams (mg) of sodium per serving. Avoid foods with 300 mg of sodium or more per serving. Always check how much sodium is in a product, even if the label says unsalted or no salt added. Shopping  Buy products labeled as low-sodium or no salt added. Buy fresh foods. Avoid canned foods and pre-made or frozen meals. Avoid canned, cured, or processed meats. Buy breads that have less than 80 mg of sodium per slice. Cooking  Eat more home-cooked food. Try to eat less restaurant, buffet, and fast food. Try not to  add salt when you cook. Use salt-free seasonings or herbs instead of table salt or sea salt. Check with your provider or pharmacist before using salt substitutes. Cook with plant-based oils, such as canola, sunflower, or olive oil. Meal planning When eating at a restaurant, ask if your food can be made with less salt or no salt. Avoid dishes labeled as brined, pickled, cured, or smoked. Avoid dishes made with soy sauce, miso, or  teriyaki sauce. Avoid foods that have monosodium glutamate (MSG) in them. MSG may be added to some restaurant food, sauces, soups, bouillon, and canned foods. Make meals that can be grilled, baked, poached, roasted, or steamed. These are often made with less sodium. General information Try to limit your sodium intake to 1,500-2,300 mg each day, or the amount told by your provider. What foods should I eat? Fruits Fresh, frozen, or canned fruit. Fruit juice. Vegetables Fresh or frozen vegetables. No salt added canned vegetables. No salt added tomato sauce and paste. Low-sodium or reduced-sodium tomato and vegetable juice. Grains Low-sodium cereals, such as oats, puffed wheat and rice, and shredded wheat. Low-sodium crackers. Unsalted rice. Unsalted pasta. Low-sodium bread. Whole grain breads and whole grain pasta. Meats and other proteins Fresh or frozen meat, poultry, seafood, and fish. These should have no added salt. Low-sodium canned tuna and salmon. Unsalted nuts. Dried peas, beans, and lentils without added salt. Unsalted canned beans. Eggs. Unsalted nut butters. Dairy Milk. Soy milk. Cheese that is naturally low in sodium, such as ricotta cheese, fresh mozzarella, or Swiss cheese. Low-sodium or reduced-sodium cheese. Cream cheese. Yogurt. Seasonings and condiments Fresh and dried herbs and spices. Salt-free seasonings. Low-sodium mustard and ketchup. Sodium-free salad dressing. Sodium-free light mayonnaise. Fresh or refrigerated horseradish. Lemon juice. Vinegar. Other foods Homemade, reduced-sodium, or low-sodium soups. Unsalted popcorn and pretzels. Low-salt or salt-free chips. The items listed above may not be all the foods and drinks you can have. Talk to a dietitian to learn more. What foods should I avoid? Vegetables Sauerkraut, pickled vegetables, and relishes. Olives. French fries. Onion rings. Regular canned vegetables, except low-sodium or reduced-sodium items. Regular  canned tomato sauce and paste. Regular tomato and vegetable juice. Frozen vegetables in sauces. Grains Instant hot cereals. Bread stuffing, pancake, and biscuit mixes. Croutons. Seasoned rice or pasta mixes. Noodle soup cups. Boxed or frozen macaroni and cheese. Regular salted crackers. Self-rising flour. Meats and other proteins Meat or fish that is salted, canned, smoked, spiced, or pickled. Precooked or cured meat, such as sausages or meat loaves. [place]. Ham. Pepperoni. Hot dogs. Corned beef. Chipped beef. Salt pork. Jerky. Pickled herring, anchovies, and sardines. Regular canned tuna. Salted nuts. Dairy Processed cheese and cheese spreads. Hard cheeses. Cheese curds. Blue cheese. Feta cheese. String cheese. Regular cottage cheese. Buttermilk. Canned milk. Fats and oils Salted butter. Regular margarine. Ghee. Bacon fat. Seasonings and condiments Onion salt, garlic salt, seasoned salt, table salt, and sea salt. Canned and packaged gravies. Worcestershire sauce. Tartar sauce. Barbecue sauce. Teriyaki sauce. Soy sauce, including reduced-sodium soy sauce. Steak sauce. Fish sauce. Oyster sauce. Cocktail sauce. Horseradish that you find on the shelf. Regular ketchup and mustard. Meat flavorings and tenderizers. Bouillon cubes. Hot sauce. Pre-made or packaged marinades. Pre-made or packaged taco seasonings. Relishes. Regular salad dressings. Salsa. Other foods Salted popcorn and pretzels. Corn chips and puffs. Potato and tortilla chips. Canned or dried soups. Pizza. Frozen entrees and pot pies. The items listed above may not be all the foods and drinks you should avoid. Talk to a  dietitian to learn more. This information is not intended to replace advice given to you by your health care provider. Make sure you discuss any questions you have with your health care provider. Document Revised: 04/26/2022 Document Reviewed: 04/26/2022 Elsevier Patient Education  2024 Elsevier Inc.  Important Information  About Sugar

## 2023-05-29 NOTE — Assessment & Plan Note (Addendum)
 Mild nonobstructive coronary artery disease based on CT coronary angiogram November 2024 with CAD RADS 2 study with calcium score of 332 and plaque volume 362 mm cube. On anticoagulation with Xarelto  hence not on antiplatelet therapy. On statin therapy with pravastatin  40 mg once daily. Will hold off on escalating statin therapy at this time.

## 2023-05-29 NOTE — Assessment & Plan Note (Signed)
S/p TEE cardioversion 03/25/2023 at [facility]. Elevated CHA2DS2-VASc score at least 5. Remains on anticoagulation with Xarelto 20 mg once daily. Remains in sinus rhythm today in the office.

## 2023-05-29 NOTE — Assessment & Plan Note (Signed)
 Sense of skipped beat and an extra beat.  Occurring intermittently. Sinus arrhythmia on EKG today with a sinus pause less than 2 seconds. Baseline prolonged PR interval On metoprolol  tartrate 100 mg 2 times daily at present.  Will lower the dose of metoprolol  tartrate to 50 mg twice daily. Will obtain 7-day Zio patch monitor to assess for any significant cardiac arrhythmias contributing to her symptoms. Will obtain thyroid  panel. Given her symptoms of fatigue association with the above symptoms we will also obtain a CBC to rule out any significant drop in hemoglobin and hematocrit as she is on anticoagulation for A-fib as reviewed above.

## 2023-05-29 NOTE — Progress Notes (Signed)
 Cardiology Consultation:    Date:  05/29/2023   ID:  Tina Curry, DOB [birth year], MRN [id number]  PCP:  Street, Tina HERO, MD  Cardiologist:  Alean SAUNDERS Marye Eagen, MD   Referring MD: Street, Tina Curry, *   No chief complaint on file.    ASSESSMENT AND PLAN:   [name] 83 year old woman with history of mild nonobstructive coronary artery disease based on CT coronary angiogram November 2024 [CAD RADS 2 study], persistent atrial fibrillation s/p TEE cardioversion 03/25/2023, hypertension, dyslipidemia, first-degree AV block, obstructive sleep apnea, follows up with Dr. Edwyna and here for earlier follow-up in the setting of symptoms of fatigue with a sense of irregular heartbeat Problem List Items Addressed This Visit     Palpitations   Sense of skipped beat and an extra beat.  Occurring intermittently. Sinus arrhythmia on EKG today with a sinus pause less than 2 seconds. Baseline prolonged PR interval On metoprolol  tartrate 100 mg 2 times daily at present.  Will lower the dose of metoprolol  tartrate to 50 mg twice daily. Will obtain 7-day Zio patch monitor to assess for any significant cardiac arrhythmias contributing to her symptoms. Will obtain thyroid  panel. Given her symptoms of fatigue association with the above symptoms we will also obtain a CBC to rule out any significant drop in hemoglobin and hematocrit as she is on anticoagulation for A-fib as reviewed above.       Relevant Orders   LONG TERM MONITOR (3-14 DAYS)   CBC   TSH+T4F+T3Free   Persistent atrial fibrillation (HCC) - Primary   S/p TEE cardioversion 03/25/2023 at [facility]. Elevated CHA2DS2-VASc score at least 5. Remains on anticoagulation with Xarelto  20 mg once daily. Remains in sinus rhythm today in the office.        Relevant Medications   metolazone  (ZAROXOLYN ) 2.5 MG tablet   Other Relevant Orders   LONG TERM MONITOR (3-14 DAYS)   CBC   TSH+T4F+T3Free   Coronary artery disease    Mild nonobstructive coronary artery disease based on CT coronary angiogram November 2024 with CAD RADS 2 study with calcium score of 332 and plaque volume 362 mm cube. On anticoagulation with Xarelto  hence not on antiplatelet therapy. On statin therapy with pravastatin  40 mg once daily. Will hold off on escalating statin therapy at this time.        Relevant Medications   metolazone  (ZAROXOLYN ) 2.5 MG tablet   She has follow-up visit scheduled with Dr. Edwyna in March. Will review the results from tests we are ordering today.  Further follow-up based on the test results.    History of Present Illness:    Tina Curry is a 83 y.o. female who is being seen today for follow-up. Follows up with Dr. Edwyna at our office, last visit April 09, 2023. PCP is Street, [place], *.  Has history of persistent atrial fibrillation s/p TEE cardioversion recently at [facility], coronary artery disease [coronary CT imaging from 02/26/2023 noted a CAD RADS 2 study with mild nonobstructive disease and calcium score of 332 and plaque volume 362 mm cube], hypertension, dyslipidemia, obstructive sleep apnea  Here for the visit today accompanied by her husband in the setting of reaching out to the office for symptoms of fatigue, weakness and sense of irregular heartbeat.  She describes for the last few weeks she has been sensing slow heart rates and at times feels an extra beat.  Mentions this does not appear to occur back-to-back.  Can occur on  and off multiple times in a day.  No obvious trigger or relieving factor.  Feels a general lack of energy.  Denies any chest pain denies any shortness of breath or orthopnea.  Denies any syncopal episode.  Denies any significantly low blood pressures.  Has her home log of blood pressure readings which fluctuates between 130s to 150s systolic.  Mentions her heart rate ranges between 50s to 80s at home.  Mentions no blood in urine or stools. No  significant pedal edema and she uses compression socks regularly.  Mentions good compliance with all her medications. Continues to take metoprolol  tartrate 100 mg 2 times daily.  EKG in the clinic today shows sinus rhythm with heart rate 54/min, with sinus arrhythmia.  Prolonged PR interval 248 ms.  Anterior Q waves.  Recent TEE at the time of cardioversion at [facility] 03/25/2023 reported LVEF 55 to 60%, dilated left atrium without any clot, mild mitral regurgitation, mild pulmonary insufficiency, mild tricuspid regurgitation.  Underwent successful cardioversion with 200 J 1 time synchronized shock.  Recent CT coronary imaging from 02/26/2023 with CAD RADS 2 study mild nonobstructive disease, calcium score 332, plaque volume 362 mm cube, extracardiac findings of bronchial wall thickening suggestive of air trapping and right hilar and shotty mediastinal lymph nodes suspected to be reactive.  Recent lipid panel from 04/30/2023 total cholesterol 199, HDL 55, LDL 113, triglycerides 181. Liver function panel with normal transaminases and alkaline phosphatase.  Low total protein and albumin levels. Last basic metabolic panel available from 87/72/7975 BUN 20, creatinine 0.82 and EGFR 71. Last CBC available is from 03/07/2023 hemoglobin 13, hematocrit 40.8, platelets 457 and WBC 7.9.  Past Medical History:  Diagnosis Date   Allergy to alpha-gal    Aortic valve sclerosis 05/12/2021   Atrial fibrillation (HCC) 02/21/2023   Chronic cough 11/20/2021   Coronary artery disease 03/07/2023   Dyslipidemia 12/26/2016   Episodic lightheadedness 08/26/2017   Essential hypertension 07/13/2009   Qualifier: Diagnosis of   By: Lang CMA, Jessica      IMO SNOMED Dx Update Oct 2024     Goiter 12/15/2009   Qualifier: Diagnosis of   By: Jude MD, Harden ROCKFORD      IMO SNOMED Dx Update Oct 2024     Leg pain, bilateral 01/15/2018   Malaise and fatigue 11/03/2018   Meningitis    Murmur 03/06/2017   OSA  (obstructive sleep apnea) 11/08/2011   PSG 10/12 mild, 9/h     Other and unspecified hyperlipidemia    Other chest pain 05/12/2021   Palpitations 12/26/2016   Paroxysmal atrial fibrillation (HCC) 03/07/2023   Peripheral vascular disease (HCC) 12/26/2016   Viral upper respiratory tract infection 12/26/2016    Past Surgical History:  Procedure Laterality Date   BACK SURGERY  1990 and 2001   bladdr tack  03/06/2004   CATARACT EXTRACTION Left 2011   CATARACT EXTRACTION Right 2017   CHOLECYSTECTOMY  02/18/2009   EYE SURGERY     left   FOOT SURGERY Left 1992   left   TUBAL LIGATION  04/23/1972   VESICOVAGINAL FISTULA CLOSURE W/ TAH  04/23/1992   VITRECTOMY Left 2010   left eye    Current Medications: Current Meds  Medication Sig   amLODipine  (NORVASC ) 2.5 MG tablet Take 1 tablet (2.5 mg total) by mouth daily.   Cholecalciferol (D3) 25 MCG (1000 UT) capsule Take 1,000 Units by mouth daily.   cloNIDine  (CATAPRES ) 0.2 MG tablet Take 0.2 mg by mouth daily.  cyanocobalamin  1000 MCG tablet Take 2,500 mcg by mouth daily.   EPINEPHrine 0.3 mg/0.3 mL IJ SOAJ injection Inject 0.3 mg into the muscle as needed for anaphylaxis.   ipratropium (ATROVENT ) 0.06 % nasal spray Can use two sprays in each nostril every six hours to dry up runny nose.   magnesium oxide (MAG-OX) 400 (240 Mg) MG tablet Take 400 mg by mouth daily.   methenamine  (HIPREX ) 1 g tablet Take 1 g by mouth daily.   metolazone  (ZAROXOLYN ) 2.5 MG tablet Take 2.5 mg by mouth daily.   metoprolol  tartrate (LOPRESSOR ) 25 MG tablet Take 4 tablets (100 mg total) by mouth 2 (two) times daily.   montelukast (SINGULAIR) 10 MG tablet Take 10 mg by mouth at bedtime.   NIACINAMIDE PO Take 1,000 mg by mouth daily.   omeprazole (PRILOSEC) 40 MG capsule Take 40 mg by mouth 2 (two) times daily.     potassium chloride  (KLOR-CON ) 10 MEQ tablet Take 2 tablets (20 mEq total) by mouth 2 (two) times daily.   pravastatin  (PRAVACHOL ) 40 MG tablet Take  1 tablet (40 mg total) by mouth daily.   rivaroxaban  (XARELTO ) 20 MG TABS tablet Take 1 tablet (20 mg total) by mouth daily with supper.   SANTYL 250 UNIT/GM ointment Apply 1 Application topically daily.   vitamin C (ASCORBIC ACID) 500 MG tablet Take 1,000 mg by mouth daily.   Zinc Acetate 50 MG CAPS Take 1 capsule by mouth daily.     Allergies:   Eliquis [apixaban], Losartan, Alpha-gal, Cephalexin, Clarithromycin, Iodine, Levofloxacin, Povidone-iodine, Ramipril, Tequin [gatifloxacin], Atorvastatin, Bee venom, Iodinated contrast media, and Kiwi extract   Social History   Socioeconomic History   Marital status: Married    Spouse name: Not on file   Number of children: Not on file   Years of education: Not on file   Highest education level: Not on file  Occupational History   Occupation: housewife    Employer: RETIRED  Tobacco Use   Smoking status: Never    Passive exposure: Past   Smokeless tobacco: Former    Types: Snuff  Substance and Sexual Activity   Alcohol use: No   Drug use: No   Sexual activity: Not on file  Other Topics Concern   Not on file  Social History Narrative   Not on file   Social Drivers of Health   Financial Resource Strain: Not on file  Food Insecurity: Not on file  Transportation Needs: Not on file  Physical Activity: Not on file  Stress: Not on file  Social Connections: Not on file     Family History: The patient's family history includes Heart attack in her father and mother; Other in her brother and sister; Stroke in her father. ROS:   Please see the history of present illness.    All 14 point review of systems negative except as described per history of present illness.  EKGs/Labs/Other Studies Reviewed:    The following studies were reviewed today:   EKG:  EKG Interpretation Date/Time:  Wednesday May 29 2023 08:24:52 EST Ventricular Rate:  54 PR Interval:  248 QRS Duration:  78 QT Interval:  412 QTC Calculation: 390 R  Axis:   90  Text Interpretation: Sinus bradycardia with marked sinus arrhythmia with 1st degree A-V block Rightward axis Anterior infarct (cited on or before 21-Feb-2023) Abnormal ECG When compared with ECG of 09-Apr-2023 16:50, Questionable change in initial forces of Lateral leads Confirmed by Liborio Hai reddy [id number]) on 05/29/2023 9:15:09  AM    Recent Labs: 03/07/2023: Hemoglobin 13.0; Platelets 457 04/09/2023: Magnesium 1.8 04/19/2023: BUN 16; Creatinine, Ser 0.82; Potassium 3.8; Sodium 140 04/30/2023: ALT 15  Recent Lipid Panel    Component Value Date/Time   CHOL 199 04/30/2023 0948   TRIG 181 (H) 04/30/2023 0948   HDL 55 04/30/2023 0948   CHOLHDL 3.6 04/30/2023 0948   LDLCALC 113 (H) 04/30/2023 0948    Physical Exam:    VS:  BP (!) 146/78   Pulse (!) 54   Ht 5' 5 (1.651 m)   Wt 180 lb 3.2 oz (81.7 kg)   SpO2 97%   BMI 29.99 kg/m     Wt Readings from Last 3 Encounters:  05/29/23 180 lb 3.2 oz (81.7 kg)  04/09/23 176 lb 6.4 oz (80 kg)  03/07/23 176 lb 9.6 oz (80.1 kg)     GENERAL:  Well nourished, well developed in no acute distress NECK: No JVD; No carotid bruits CARDIAC: RRR, S1 and S2 present, no murmurs, no rubs, no gallops CHEST:  Clear to auscultation without rales, wheezing or rhonchi  Extremities: No pitting pedal edema. Pulses bilaterally symmetric with radial 2+ and dorsalis pedis 2+ NEUROLOGIC:  Alert and oriented x 3  Medication Adjustments/Labs and Tests Ordered: Current medicines are reviewed at length with the patient today.  Concerns regarding medicines are outlined above.  Orders Placed This Encounter  Procedures   CBC   TSH+T4F+T3Free   LONG TERM MONITOR (3-14 DAYS)   EKG 12-Lead   No orders of the defined types were placed in this encounter.   Signed, Alean jess Kobus, MD, MPH, [facility]. 05/29/2023 9:38 AM    [facility]

## 2023-05-30 LAB — TSH+T4F+T3FREE
Free T4: 1.16 ng/dL (ref 0.82–1.77)
T3, Free: 2.7 pg/mL (ref 2.0–4.4)
TSH: 2.12 u[IU]/mL (ref 0.450–4.500)

## 2023-05-30 LAB — CBC
Hematocrit: 38.7 % (ref 34.0–46.6)
Hemoglobin: 12 g/dL (ref 11.1–15.9)
MCH: 25.4 pg — ABNORMAL LOW (ref 26.6–33.0)
MCHC: 31 g/dL — ABNORMAL LOW (ref 31.5–35.7)
MCV: 82 fL (ref 79–97)
Platelets: 328 10*3/uL (ref 150–450)
RBC: 4.72 x10E6/uL (ref 3.77–5.28)
RDW: 14.8 % (ref 11.7–15.4)
WBC: 9.6 10*3/uL (ref 3.4–10.8)

## 2023-06-04 ENCOUNTER — Telehealth: Payer: Self-pay

## 2023-06-04 NOTE — Telephone Encounter (Signed)
Left vm to return call.

## 2023-06-04 NOTE — Telephone Encounter (Signed)
-----   Message from Rogers R Madireddy sent at 06/04/2023  8:26 AM EST ----- Please inform her blood work results from last week show normal blood counts. Normal thyroid function parameters. Please forward the results to their PCP records. Will review heart monitor results once available.  Thank you

## 2023-06-12 DIAGNOSIS — I4819 Other persistent atrial fibrillation: Secondary | ICD-10-CM | POA: Diagnosis not present

## 2023-06-12 DIAGNOSIS — R002 Palpitations: Secondary | ICD-10-CM | POA: Diagnosis not present

## 2023-06-13 ENCOUNTER — Telehealth: Payer: Self-pay | Admitting: Emergency Medicine

## 2023-06-13 DIAGNOSIS — I4819 Other persistent atrial fibrillation: Secondary | ICD-10-CM

## 2023-06-13 DIAGNOSIS — R002 Palpitations: Secondary | ICD-10-CM | POA: Diagnosis not present

## 2023-06-13 NOTE — Telephone Encounter (Signed)
 Called and spoke to patient to review Dr. Madireddy's result note. Reviewed results with patient. Patient reports that she is currently taking Metoprolol Tartrate 50 mg twice a day as on her medication list. Patient reports she feels better since being on this dose. Advised patient I would clarify dose with Dr. Vincent Gros. She verbalized understanding and had no further questions.

## 2023-06-13 NOTE — Telephone Encounter (Signed)
-----   Message from Faith R Madireddy sent at 06/13/2023  3:56 PM EST ----- Please inform her of the heart monitor results show no significant burden of atrial fibrillation.  Average heart rate remains around 70 bpm and regular rhythm.  She does have frequent instances of extra beats occurring from top chambers of the heart [supraventricular ectopy].  Overall no changes at this time to her medications, continue with her current dose of metoprolol tartrate 100 mg twice daily. Further follow-up with Dr. Tomie China as scheduled for March 25. Thank you

## 2023-06-24 ENCOUNTER — Telehealth: Payer: Self-pay | Admitting: Cardiology

## 2023-06-24 NOTE — Telephone Encounter (Signed)
 Patient called requesting her lab results from 05/29/23 be faxed over to [facility], Dr. Casper Harrison.

## 2023-06-24 NOTE — Telephone Encounter (Signed)
Labs sent via [employer]

## 2023-06-27 DIAGNOSIS — Z131 Encounter for screening for diabetes mellitus: Secondary | ICD-10-CM | POA: Diagnosis not present

## 2023-06-27 DIAGNOSIS — E785 Hyperlipidemia, unspecified: Secondary | ICD-10-CM | POA: Diagnosis not present

## 2023-06-27 DIAGNOSIS — E559 Vitamin D deficiency, unspecified: Secondary | ICD-10-CM | POA: Diagnosis not present

## 2023-06-27 LAB — LAB REPORT - SCANNED
A1c: 6.4
EGFR: 60

## 2023-07-03 DIAGNOSIS — D6869 Other thrombophilia: Secondary | ICD-10-CM | POA: Diagnosis not present

## 2023-07-03 DIAGNOSIS — H60393 Other infective otitis externa, bilateral: Secondary | ICD-10-CM | POA: Diagnosis not present

## 2023-07-03 DIAGNOSIS — I10 Essential (primary) hypertension: Secondary | ICD-10-CM | POA: Diagnosis not present

## 2023-07-03 DIAGNOSIS — G720 Drug-induced myopathy: Secondary | ICD-10-CM | POA: Diagnosis not present

## 2023-07-03 DIAGNOSIS — E785 Hyperlipidemia, unspecified: Secondary | ICD-10-CM | POA: Diagnosis not present

## 2023-07-03 DIAGNOSIS — T466X5A Adverse effect of antihyperlipidemic and antiarteriosclerotic drugs, initial encounter: Secondary | ICD-10-CM | POA: Diagnosis not present

## 2023-07-03 DIAGNOSIS — Z0000 Encounter for general adult medical examination without abnormal findings: Secondary | ICD-10-CM | POA: Diagnosis not present

## 2023-07-03 DIAGNOSIS — I480 Paroxysmal atrial fibrillation: Secondary | ICD-10-CM | POA: Diagnosis not present

## 2023-07-03 DIAGNOSIS — Z889 Allergy status to unspecified drugs, medicaments and biological substances status: Secondary | ICD-10-CM | POA: Diagnosis not present

## 2023-07-03 DIAGNOSIS — Z91014 Allergy to mammalian meats: Secondary | ICD-10-CM | POA: Diagnosis not present

## 2023-07-03 DIAGNOSIS — E876 Hypokalemia: Secondary | ICD-10-CM | POA: Diagnosis not present

## 2023-07-04 DIAGNOSIS — N390 Urinary tract infection, site not specified: Secondary | ICD-10-CM | POA: Diagnosis not present

## 2023-07-04 DIAGNOSIS — R8290 Unspecified abnormal findings in urine: Secondary | ICD-10-CM | POA: Diagnosis not present

## 2023-07-10 ENCOUNTER — Other Ambulatory Visit: Payer: Self-pay

## 2023-07-16 ENCOUNTER — Encounter: Payer: Self-pay | Admitting: Cardiology

## 2023-07-16 ENCOUNTER — Ambulatory Visit: Payer: PPO | Attending: Cardiology | Admitting: Cardiology

## 2023-07-16 VITALS — BP 154/80 | HR 71 | Ht 65.0 in | Wt 181.2 lb

## 2023-07-16 DIAGNOSIS — E785 Hyperlipidemia, unspecified: Secondary | ICD-10-CM

## 2023-07-16 DIAGNOSIS — I2510 Atherosclerotic heart disease of native coronary artery without angina pectoris: Secondary | ICD-10-CM

## 2023-07-16 DIAGNOSIS — I480 Paroxysmal atrial fibrillation: Secondary | ICD-10-CM | POA: Diagnosis not present

## 2023-07-16 DIAGNOSIS — I10 Essential (primary) hypertension: Secondary | ICD-10-CM | POA: Diagnosis not present

## 2023-07-16 MED ORDER — AMLODIPINE BESYLATE 5 MG PO TABS: 5.0000 mg | ORAL_TABLET | Freq: Every day | ORAL | 3 refills | Status: AC

## 2023-07-16 NOTE — Patient Instructions (Signed)
 Medication Instructions:  Your physician has recommended you make the following change in your medication:  Increase Amlodipine to 5 mg once daily  *If you need a refill on your cardiac medications before your next appointment, please call your pharmacy*   Lab Work: Your physician recommends that you return for lab work in: Today for Basic Metabolic Panel  If you have labs (blood work) drawn today and your tests are completely normal, you will receive your results only by: MyChart Message (if you have MyChart) OR A paper copy in the mail If you have any lab test that is abnormal or we need to change your treatment, we will call you to review the results.   Testing/Procedures: NONE   Follow-Up: At [facility], you and your health needs are our priority.  As part of our continuing mission to provide you with exceptional heart care, we have created designated Provider Care Teams.  These Care Teams include your primary Cardiologist (physician) and Advanced Practice Providers (APPs -  Physician Assistants and Nurse Practitioners) who all work together to provide you with the care you need, when you need it.  We recommend signing up for the patient portal called "MyChart".  Sign up information is provided on this After Visit Summary.  MyChart is used to connect with patients for Virtual Visits (Telemedicine).  Patients are able to view lab/test results, encounter notes, upcoming appointments, etc.  Non-urgent messages can be sent to your provider as well.   To learn more about what you can do with MyChart, go to [email or web address].    Your next appointment:   6 month(s)  Provider:   Belva Crome, MD    Other Instructions

## 2023-07-16 NOTE — Progress Notes (Signed)
 Cardiology Office Note:    Date:  07/16/2023   ID:  Tina Curry, DOB February 07, 1941, MRN [id number]  PCP:  Street, Stephanie Coup, MD  Cardiologist:  Garwin Brothers, MD   Referring MD: [address] Stephanie Coup, *    ASSESSMENT:    1. Dyslipidemia   2. Coronary artery disease involving native coronary artery of native heart without angina pectoris   3. Essential hypertension   4. Paroxysmal atrial fibrillation (HCC)    PLAN:    In order of problems listed above:  Coronary artery disease: Secondary prevention stressed with the patient.  Importance of compliance with diet medication stressed and she vocalized understanding.  She was advised to walk to the best of her ability. Paroxysmal atrial fibrillation:I discussed with the patient atrial fibrillation, disease process. Management and therapy including rate and rhythm control, anticoagulation benefits and potential risks were discussed extensively with the patient. Patient had multiple questions which were answered to patient's satisfaction. Mixed dyslipidemia: On lipid-lowering medications followed by primary care.  Lipids reviewed and discussed with her.  Weight reduction stressed. Hypokalemia: Last blood work done earlier this month reveals hypokalemia and we will check Chem-7 today. Essential hypertension: Not under the best of control I increased amlodipine to 5 mg daily and she will keep a track of pulse blood pressure at home and send [place] a copy of the log in a week.  Diet emphasized.Patient will be seen in follow-up appointment in 6 months or earlier if the patient has any concerns.    Medication Adjustments/Labs and Tests Ordered: Current medicines are reviewed at length with the patient today.  Concerns regarding medicines are outlined above.  Orders Placed This Encounter  Procedures   EKG 12-Lead   No orders of the defined types were placed in this encounter.    No chief complaint on file.    History of Present Illness:     Tina Curry is a 83 y.o. female.  Patient has past medical history of essential hypertension, mixed dyslipidemia, paroxysmal atrial fibrillation and hypokalemia.  She denies any problems at this time and takes care of activities of daily living.  No chest pain orthopnea or PND.  At the time of my evaluation, the patient is alert awake oriented and in no distress.  Past Medical History:  Diagnosis Date   Allergy to alpha-gal    Aortic valve sclerosis 05/12/2021   Atrial fibrillation (HCC) 02/21/2023   Chronic cough 11/20/2021   Coronary artery disease 03/07/2023   Dyslipidemia 12/26/2016   Episodic lightheadedness 08/26/2017   Essential hypertension 07/13/2009   Qualifier: Diagnosis of   By: Roxan Hockey CMA, Milas Kocher SNOMED Dx Update Oct 2024     Goiter 12/15/2009   Qualifier: Diagnosis of   By: Vassie Loll MD, Comer Locket      IMO SNOMED Dx Update Oct 2024     Leg pain, bilateral 01/15/2018   Malaise and fatigue 11/03/2018   Meningitis    Murmur 03/06/2017   OSA (obstructive sleep apnea) 11/08/2011   PSG 10/12 mild, 9/h     Other and unspecified hyperlipidemia    Other chest pain 05/12/2021   Palpitations 12/26/2016   Paroxysmal atrial fibrillation (HCC) 03/07/2023   Peripheral vascular disease (HCC) 12/26/2016   Persistent atrial fibrillation (HCC) 03/07/2023   Viral upper respiratory tract infection 12/26/2016    Past Surgical History:  Procedure Laterality Date   BACK SURGERY  1990 and 2001   bladdr tack  03/06/2004  CATARACT EXTRACTION Left 2011   CATARACT EXTRACTION Right 2017   CHOLECYSTECTOMY  02/18/2009   EYE SURGERY     left   FOOT SURGERY Left 1992   left   TUBAL LIGATION  04/23/1972   VESICOVAGINAL FISTULA CLOSURE W/ TAH  04/23/1992   VITRECTOMY Left 2010   left eye    Current Medications: Current Meds  Medication Sig   Cholecalciferol (D3) 25 MCG (1000 UT) capsule Take 1,000 Units by mouth daily.   cloNIDine (CATAPRES) 0.2 MG tablet Take 0.2 mg by mouth  daily.   cyanocobalamin 1000 MCG tablet Take 2,500 mcg by mouth daily.   EPINEPHrine 0.3 mg/0.3 mL IJ SOAJ injection Inject 0.3 mg into the muscle as needed for anaphylaxis.   ipratropium (ATROVENT) 0.06 % nasal spray Can use two sprays in each nostril every six hours to dry up runny nose.   magnesium oxide (MAG-OX) 400 (240 Mg) MG tablet Take 400 mg by mouth daily.   methenamine (HIPREX) 1 g tablet Take 1 g by mouth daily.   metolazone (ZAROXOLYN) 2.5 MG tablet Take 2.5 mg by mouth daily.   metoprolol tartrate (LOPRESSOR) 25 MG tablet Take 2 tablets (50 mg total) by mouth 2 (two) times daily.   montelukast (SINGULAIR) 10 MG tablet Take 10 mg by mouth at bedtime.   NIACINAMIDE PO Take 1,000 mg by mouth daily.   omeprazole (PRILOSEC) 40 MG capsule Take 40 mg by mouth daily.   potassium chloride SA (KLOR-CON M) 20 MEQ tablet Take 20 mEq by mouth 2 (two) times daily.   pravastatin (PRAVACHOL) 40 MG tablet Take 1 tablet (40 mg total) by mouth daily.   rivaroxaban (XARELTO) 20 MG TABS tablet Take 1 tablet (20 mg total) by mouth daily with supper.   vitamin C (ASCORBIC ACID) 500 MG tablet Take 1,000 mg by mouth daily.   Zinc Acetate 50 MG CAPS Take 1 capsule by mouth daily.   [DISCONTINUED] amLODipine (NORVASC) 2.5 MG tablet Take 2.5 mg by mouth daily.     Allergies:   Eliquis [apixaban], Losartan, Alpha-gal, Cephalexin, Clarithromycin, Iodine, Levofloxacin, Povidone-iodine, Ramipril, Tequin [gatifloxacin], Atorvastatin, Bee venom, Iodinated contrast media, and Kiwi extract   Social History   Socioeconomic History   Marital status: Married    Spouse name: Not on file   Number of children: Not on file   Years of education: Not on file   Highest education level: Not on file  Occupational History   Occupation: housewife    Employer: RETIRED  Tobacco Use   Smoking status: Never    Passive exposure: Past   Smokeless tobacco: Former    Types: Snuff  Substance and Sexual Activity   Alcohol  use: No   Drug use: No   Sexual activity: Not on file  Other Topics Concern   Not on file  Social History Narrative   Not on file   Social Drivers of Health   Financial Resource Strain: Not on file  Food Insecurity: Not on file  Transportation Needs: Not on file  Physical Activity: Not on file  Stress: Not on file  Social Connections: Not on file     Family History: The patient's family history includes Heart attack in her father and mother; Other in her brother and sister; Stroke in her father.  ROS:   Please see the history of present illness.    All other systems reviewed and are negative.  EKGs/Labs/Other Studies Reviewed:    The following studies were reviewed today:  I discussed my findings with the patient at length.   Recent Labs: 04/09/2023: Magnesium 1.8 04/19/2023: BUN 16; Creatinine, Ser 0.82; Potassium 3.8; Sodium 140 04/30/2023: ALT 15 05/29/2023: Hemoglobin 12.0; Platelets 328; TSH 2.120  Recent Lipid Panel    Component Value Date/Time   CHOL 199 04/30/2023 0948   TRIG 181 (H) 04/30/2023 0948   HDL 55 04/30/2023 0948   CHOLHDL 3.6 04/30/2023 0948   LDLCALC 113 (H) 04/30/2023 0948    Physical Exam:    VS:  BP (!) 154/80   Pulse 71   Ht 5\' 5"  (1.651 m)   Wt 181 lb 3.2 oz (82.2 kg)   SpO2 95%   BMI 30.15 kg/m     Wt Readings from Last 3 Encounters:  07/16/23 181 lb 3.2 oz (82.2 kg)  05/29/23 180 lb 3.2 oz (81.7 kg)  04/09/23 176 lb 6.4 oz (80 kg)     GEN: Patient is in no acute distress HEENT: Normal NECK: No JVD; No carotid bruits LYMPHATICS: No lymphadenopathy CARDIAC: Hear sounds regular, 2/6 systolic murmur at the apex. RESPIRATORY:  Clear to auscultation without rales, wheezing or rhonchi  ABDOMEN: Soft, non-tender, non-distended MUSCULOSKELETAL:  No edema; No deformity  SKIN: Warm and dry NEUROLOGIC:  Alert and oriented x 3 PSYCHIATRIC:  Normal affect   Signed, Garwin Brothers, MD  07/16/2023 9:20 AM    [facility]

## 2023-07-17 ENCOUNTER — Telehealth: Payer: Self-pay

## 2023-07-17 DIAGNOSIS — E876 Hypokalemia: Secondary | ICD-10-CM

## 2023-07-17 LAB — BASIC METABOLIC PANEL
BUN/Creatinine Ratio: 14 (ref 12–28)
BUN: 12 mg/dL (ref 8–27)
CO2: 28 mmol/L (ref 20–29)
Calcium: 9.1 mg/dL (ref 8.7–10.3)
Chloride: 97 mmol/L (ref 96–106)
Creatinine, Ser: 0.83 mg/dL (ref 0.57–1.00)
Glucose: 122 mg/dL — ABNORMAL HIGH (ref 70–99)
Potassium: 3.4 mmol/L — ABNORMAL LOW (ref 3.5–5.2)
Sodium: 139 mmol/L (ref 134–144)
eGFR: 70 mL/min/{1.73_m2} (ref >59)

## 2023-07-17 NOTE — Telephone Encounter (Signed)
Message in My Chart

## 2023-07-17 NOTE — Telephone Encounter (Signed)
-----   Message from Page R Revankar sent at 07/17/2023  8:09 AM EDT ----- Double potassium and recheck in 10 days.  Copy primary Garwin Brothers, MD 07/17/2023 8:09 AM

## 2023-07-22 DIAGNOSIS — N390 Urinary tract infection, site not specified: Secondary | ICD-10-CM | POA: Diagnosis not present

## 2023-07-29 DIAGNOSIS — L578 Other skin changes due to chronic exposure to nonionizing radiation: Secondary | ICD-10-CM | POA: Diagnosis not present

## 2023-07-29 DIAGNOSIS — L570 Actinic keratosis: Secondary | ICD-10-CM | POA: Diagnosis not present

## 2023-07-29 DIAGNOSIS — C44622 Squamous cell carcinoma of skin of right upper limb, including shoulder: Secondary | ICD-10-CM | POA: Diagnosis not present

## 2023-07-29 DIAGNOSIS — L821 Other seborrheic keratosis: Secondary | ICD-10-CM | POA: Diagnosis not present

## 2023-07-29 DIAGNOSIS — D044 Carcinoma in situ of skin of scalp and neck: Secondary | ICD-10-CM | POA: Diagnosis not present

## 2023-08-15 DIAGNOSIS — E876 Hypokalemia: Secondary | ICD-10-CM | POA: Diagnosis not present

## 2023-08-16 LAB — BASIC METABOLIC PANEL WITH GFR
BUN/Creatinine Ratio: 13 (ref 12–28)
BUN: 11 mg/dL (ref 8–27)
CO2: 27 mmol/L (ref 20–29)
Calcium: 9.3 mg/dL (ref 8.7–10.3)
Chloride: 98 mmol/L (ref 96–106)
Creatinine, Ser: 0.82 mg/dL (ref 0.57–1.00)
Glucose: 115 mg/dL — ABNORMAL HIGH (ref 70–99)
Potassium: 3.7 mmol/L (ref 3.5–5.2)
Sodium: 139 mmol/L (ref 134–144)
eGFR: 71 mL/min/{1.73_m2} (ref >59)

## 2023-08-21 ENCOUNTER — Telehealth: Payer: Self-pay | Admitting: Emergency Medicine

## 2023-08-21 NOTE — Telephone Encounter (Addendum)
 Informed Dr.  Revankar that patient does not wish to increase amlodipine  at this time. He recommends that patient work on diet and exercise. Left patient a message to return call to review this recommendation.

## 2023-08-21 NOTE — Telephone Encounter (Signed)
 Dr. Lafayette Pierre reviewed blood pressure log that patient kept for two weeks. Dr. Lafayette Pierre recommends patient increasing Amlodipine  to 7.5 mg once a day and keeping a blood pressure log for two weeks after increasing medication.

## 2023-08-21 NOTE — Telephone Encounter (Signed)
 Called and spoke to patient regarding Dr. Lupie Salle recommendations on blood pressure log. Reviewed that Dr. Lafayette Pierre recommends increasing Amlodipine  to 7.5 mg once a day. She reports that she does not wish to do this at this time due to the medication causing swelling to her ankles. Informed patient I would let Dr. Lafayette Pierre know. She had no further questions at this time

## 2023-08-22 NOTE — Telephone Encounter (Signed)
 Called and spoke to patient.   Reviewed Dr. Barclay Leyden recommendation of working on improving diet and exercise, due to patient not wanting to increase amlodipine .   Patient verbalized understanding and had no further questions.

## 2023-08-26 DIAGNOSIS — M6281 Muscle weakness (generalized): Secondary | ICD-10-CM | POA: Diagnosis not present

## 2023-08-26 DIAGNOSIS — E538 Deficiency of other specified B group vitamins: Secondary | ICD-10-CM | POA: Diagnosis not present

## 2023-08-26 DIAGNOSIS — D509 Iron deficiency anemia, unspecified: Secondary | ICD-10-CM | POA: Diagnosis not present

## 2023-08-26 DIAGNOSIS — E559 Vitamin D deficiency, unspecified: Secondary | ICD-10-CM | POA: Diagnosis not present

## 2023-08-26 DIAGNOSIS — Z6826 Body mass index (BMI) 26.0-26.9, adult: Secondary | ICD-10-CM | POA: Diagnosis not present

## 2023-09-09 ENCOUNTER — Telehealth: Payer: Self-pay | Admitting: Cardiology

## 2023-09-09 NOTE — Telephone Encounter (Signed)
 Spoke with pt who states that she has been feeling fatigued and short of breath. Denies chest pain. Appt made for pt.

## 2023-09-09 NOTE — Telephone Encounter (Signed)
 Pt called in stating she has been fatigue for about a week. She states she doesn't think her heart is working right. She denies CP, SOB, palpitations, passing out. She also stated she hasn't taken her BP or HR. Please advise.

## 2023-09-12 ENCOUNTER — Encounter: Payer: Self-pay | Admitting: Cardiology

## 2023-09-12 ENCOUNTER — Ambulatory Visit: Attending: Cardiology | Admitting: Cardiology

## 2023-09-12 VITALS — BP 140/84 | HR 70 | Ht 65.0 in | Wt 181.2 lb

## 2023-09-12 DIAGNOSIS — E785 Hyperlipidemia, unspecified: Secondary | ICD-10-CM

## 2023-09-12 DIAGNOSIS — R002 Palpitations: Secondary | ICD-10-CM

## 2023-09-12 DIAGNOSIS — I10 Essential (primary) hypertension: Secondary | ICD-10-CM

## 2023-09-12 DIAGNOSIS — R0609 Other forms of dyspnea: Secondary | ICD-10-CM

## 2023-09-12 DIAGNOSIS — I480 Paroxysmal atrial fibrillation: Secondary | ICD-10-CM | POA: Diagnosis not present

## 2023-09-12 NOTE — Patient Instructions (Addendum)
 Medication Instructions:  Your physician recommends that you continue on your current medications as directed. Please refer to the Current Medication list given to you today.  *If you need a refill on your cardiac medications before your next appointment, please call your pharmacy*   Lab Work: BMP, ProBNP-today If you have labs (blood work) drawn today and your tests are completely normal, you will receive your results only by: MyChart Message (if you have MyChart) OR A paper copy in the mail If you have any lab test that is abnormal or we need to change your treatment, we will call you to review the results.   Testing/Procedures: Your physician has requested that you have an echocardiogram. Echocardiography is a painless test that uses sound waves to create images of your heart. It provides your doctor with information about the size and shape of your heart and how well your heart's chambers and valves are working. This procedure takes approximately one hour. There are no restrictions for this procedure. Please do NOT wear cologne, perfume, aftershave, or lotions (deodorant is allowed). Please arrive 15 minutes prior to your appointment time.  Please note: We ask at that you not bring children with you during ultrasound (echo/ vascular) testing. Due to room size and safety concerns, children are not allowed in the ultrasound rooms during exams. Our front office staff cannot provide observation of children in our lobby area while testing is being conducted. An adult accompanying a patient to their appointment will only be allowed in the ultrasound room at the discretion of the ultrasound technician under special circumstances. We apologize for any inconvenience.    Follow-Up: At [facility], you and your health needs are our priority.  As part of our continuing mission to provide you with exceptional heart care, we have created designated Provider Care Teams.  These Care Teams include your  primary Cardiologist (physician) and Advanced Practice Providers (APPs -  Physician Assistants and Nurse Practitioners) who all work together to provide you with the care you need, when you need it.  We recommend signing up for the patient portal called "MyChart".  Sign up information is provided on this After Visit Summary.  MyChart is used to connect with patients for Virtual Visits (Telemedicine).  Patients are able to view lab/test results, encounter notes, upcoming appointments, etc.  Non-urgent messages can be sent to your provider as well.   To learn more about what you can do with MyChart, go to [email or web address].    Your next appointment:   1 month(s)  The format for your next appointment:   In Person  Provider:   Ralene Burger, MD    Other Instructions NA

## 2023-09-12 NOTE — Progress Notes (Signed)
 Cardiology Office Note:    Date:  09/12/2023   ID:  Tina Curry, DOB 1941/03/03, MRN [id number]  PCP:  Street, Renford Cartwright, MD  Cardiologist:  Ralene Burger, MD    Referring MD: [address], Renford Cartwright, *   Chief Complaint  Patient presents with   Leg Swelling   Fatigue    History of Present Illness:    Tina Curry is a 83 y.o. female past medical history significant for coronary artery disease in November 2024 she got coronary CT angio which showed only mild disease, also atrial fibrillation, status post TEE and cardioversion March 25, 2023, hypertension, dyslipidemia, first-degree AV block, obstructive sleep apnea.  She comes today to my office for follow-up she is not doing well she is tired exhausted fatigue she also described to have some swelling of lower extremities which happen after dose of amlodipine  has been increased to 5 mg daily.  Denies have any chest pain tightness squeezing pressure burning chest.  Past Medical History:  Diagnosis Date   Allergy to alpha-gal    Aortic valve sclerosis 05/12/2021   Atrial fibrillation (HCC) 02/21/2023   Chronic cough 11/20/2021   Coronary artery disease 03/07/2023   Dyslipidemia 12/26/2016   Episodic lightheadedness 08/26/2017   Essential hypertension 07/13/2009   Qualifier: Diagnosis of   By: Verdia Glad CMA, Jessica      IMO SNOMED Dx Update Oct 2024     Goiter 12/15/2009   Qualifier: Diagnosis of   By: Villa Greaser MD, Jennifer Moellers      IMO SNOMED Dx Update Oct 2024     Leg pain, bilateral 01/15/2018   Malaise and fatigue 11/03/2018   Meningitis    Murmur 03/06/2017   OSA (obstructive sleep apnea) 11/08/2011   PSG 10/12 mild, 9/h     Other and unspecified hyperlipidemia    Other chest pain 05/12/2021   Palpitations 12/26/2016   Paroxysmal atrial fibrillation (HCC) 03/07/2023   Peripheral vascular disease (HCC) 12/26/2016   Persistent atrial fibrillation (HCC) 03/07/2023   Viral upper respiratory tract infection 12/26/2016    Past  Surgical History:  Procedure Laterality Date   BACK SURGERY  1990 and 2001   bladdr tack  03/06/2004   CATARACT EXTRACTION Left 2011   CATARACT EXTRACTION Right 2017   CHOLECYSTECTOMY  02/18/2009   EYE SURGERY     left   FOOT SURGERY Left 1992   left   TUBAL LIGATION  04/23/1972   VESICOVAGINAL FISTULA CLOSURE W/ TAH  04/23/1992   VITRECTOMY Left 2010   left eye    Current Medications: Current Meds  Medication Sig   amLODipine  (NORVASC ) 5 MG tablet Take 1 tablet (5 mg total) by mouth daily.   cloNIDine (CATAPRES) 0.2 MG tablet Take 0.2 mg by mouth daily.   EPINEPHrine 0.3 mg/0.3 mL IJ SOAJ injection Inject 0.3 mg into the muscle as needed for anaphylaxis.   Ferrous Sulfate (IRON) 325 (65 Fe) MG TABS Take 1 tablet by mouth daily.   ipratropium (ATROVENT ) 0.06 % nasal spray Can use two sprays in each nostril every six hours to dry up runny nose. (Patient taking differently: Place 2 sprays into both nostrils 4 (four) times daily. Can use two sprays in each nostril every six hours to dry up runny nose.)   magnesium oxide (MAG-OX) 400 (240 Mg) MG tablet Take 400 mg by mouth daily.   metolazone  (ZAROXOLYN ) 2.5 MG tablet Take 2.5 mg by mouth daily.   metoprolol  tartrate (LOPRESSOR ) 25 MG tablet Take 2 tablets (50  mg total) by mouth 2 (two) times daily.   montelukast (SINGULAIR) 10 MG tablet Take 10 mg by mouth at bedtime.   nitrofurantoin (MACRODANTIN) 100 MG capsule Take 100 mg by mouth at bedtime.   omeprazole (PRILOSEC) 40 MG capsule Take 40 mg by mouth daily.   potassium chloride  SA (KLOR-CON  M) 20 MEQ tablet Take 20 mEq by mouth 2 (two) times daily.   pravastatin  (PRAVACHOL ) 40 MG tablet Take 1 tablet (40 mg total) by mouth daily.   Probiotic Product (DAILY PROBIOTIC PO) Take 1 tablet by mouth daily. Women's Probiotic   rivaroxaban  (XARELTO ) 20 MG TABS tablet Take 1 tablet (20 mg total) by mouth daily with supper.   vitamin C (ASCORBIC ACID) 500 MG tablet Take 1,000 mg by mouth  daily.   Zinc Acetate 50 MG CAPS Take 1 capsule by mouth daily.   [DISCONTINUED] Cholecalciferol (D3) 25 MCG (1000 UT) capsule Take 1,000 Units by mouth daily.   [DISCONTINUED] cyanocobalamin  1000 MCG tablet Take 2,500 mcg by mouth daily.   [DISCONTINUED] methenamine (HIPREX) 1 g tablet Take 1 g by mouth daily.   [DISCONTINUED] NIACINAMIDE PO Take 1,000 mg by mouth daily.     Allergies:   Eliquis [apixaban], Losartan, Alpha-gal, Cephalexin, Clarithromycin, Iodine, Levofloxacin, Povidone-iodine, Ramipril, Tequin [gatifloxacin], Atorvastatin, Bee venom, Iodinated contrast media, and Kiwi extract   Social History   Socioeconomic History   Marital status: Married    Spouse name: Not on file   Number of children: Not on file   Years of education: Not on file   Highest education level: Not on file  Occupational History   Occupation: housewife    Employer: RETIRED  Tobacco Use   Smoking status: Never    Passive exposure: Past   Smokeless tobacco: Former    Types: Snuff  Substance and Sexual Activity   Alcohol use: No   Drug use: No   Sexual activity: Not on file  Other Topics Concern   Not on file  Social History Narrative   Not on file   Social Drivers of Health   Financial Resource Strain: Not on file  Food Insecurity: Not on file  Transportation Needs: Not on file  Physical Activity: Not on file  Stress: Not on file  Social Connections: Not on file     Family History: The patient's family history includes Heart attack in her father and mother; Other in her brother and sister; Stroke in her father. ROS:   Please see the history of present illness.    All 14 point review of systems negative except as described per history of present illness  EKGs/Labs/Other Studies Reviewed:         Recent Labs: 04/09/2023: Magnesium 1.8 04/30/2023: ALT 15 05/29/2023: Hemoglobin 12.0; Platelets 328; TSH 2.120 08/15/2023: BUN 11; Creatinine, Ser 0.82; Potassium 3.7; Sodium 139  Recent  Lipid Panel    Component Value Date/Time   CHOL 199 04/30/2023 0948   TRIG 181 (H) 04/30/2023 0948   HDL 55 04/30/2023 0948   CHOLHDL 3.6 04/30/2023 0948   LDLCALC 113 (H) 04/30/2023 0948    Physical Exam:    VS:  BP (!) 140/84 (BP Location: Left Arm, Patient Position: Sitting)   Pulse 70   Ht 5\' 5"  (1.651 m)   Wt 181 lb 3.2 oz (82.2 kg)   SpO2 94%   BMI 30.15 kg/m     Wt Readings from Last 3 Encounters:  09/12/23 181 lb 3.2 oz (82.2 kg)  07/16/23 181 lb  3.2 oz (82.2 kg)  05/29/23 180 lb 3.2 oz (81.7 kg)     GEN:  Well nourished, well developed in no acute distress HEENT: Normal NECK: No JVD; No carotid bruits LYMPHATICS: No lymphadenopathy CARDIAC: RRR, no murmurs, no rubs, no gallops RESPIRATORY:  Clear to auscultation without rales, wheezing or rhonchi  ABDOMEN: Soft, non-tender, non-distended MUSCULOSKELETAL:  No edema; No deformity  SKIN: Warm and dry LOWER EXTREMITIES: no swelling NEUROLOGIC:  Alert and oriented x 3 PSYCHIATRIC:  Normal affect   ASSESSMENT:    1. Essential hypertension   2. DOE (dyspnea on exertion)   3. Paroxysmal atrial fibrillation (HCC)   4. Dyslipidemia   5. Palpitations    PLAN:    In order of problems listed above:  Tiredness fatigue difficult symptoms.  Will repeat echocardiogram especially now since we do have swelling of lower extremities.  I will do proBNP and Chem-7 on her.  Will cut down amlodipine  to 2.5 mg daily to see if swelling will improve. Dyspnea on exertion multifactorial.  Will do echocardiogram and assessment of her heart. Paroxysmal atrial fibrillation she did wear a monitor she did monitor did not show any recurrences of atrial fibrillation.  Continue present management.   Medication Adjustments/Labs and Tests Ordered: Current medicines are reviewed at length with the patient today.  Concerns regarding medicines are outlined above.  Orders Placed This Encounter  Procedures   Basic metabolic panel with GFR    Pro b natriuretic peptide (BNP)   ECHOCARDIOGRAM COMPLETE   Medication changes: No orders of the defined types were placed in this encounter.   Signed, Manfred Seed, MD, [facility] 09/12/2023 3:22 PM    [facility]

## 2023-09-13 ENCOUNTER — Ambulatory Visit: Payer: Self-pay | Admitting: Cardiology

## 2023-09-13 DIAGNOSIS — R0609 Other forms of dyspnea: Secondary | ICD-10-CM

## 2023-09-13 LAB — BASIC METABOLIC PANEL WITH GFR
BUN/Creatinine Ratio: 13 (ref 12–28)
BUN: 12 mg/dL (ref 8–27)
CO2: 25 mmol/L (ref 20–29)
Calcium: 9.4 mg/dL (ref 8.7–10.3)
Chloride: 99 mmol/L (ref 96–106)
Creatinine, Ser: 0.92 mg/dL (ref 0.57–1.00)
Glucose: 90 mg/dL (ref 70–99)
Potassium: 3.5 mmol/L (ref 3.5–5.2)
Sodium: 139 mmol/L (ref 134–144)
eGFR: 62 mL/min/{1.73_m2} (ref >59)

## 2023-09-13 LAB — PRO B NATRIURETIC PEPTIDE: NT-Pro BNP: 969 pg/mL — ABNORMAL HIGH (ref 0–738)

## 2023-10-04 ENCOUNTER — Telehealth: Payer: Self-pay

## 2023-10-04 MED ORDER — FUROSEMIDE 40 MG PO TABS: 40.0000 mg | ORAL_TABLET | Freq: Every day | ORAL | 3 refills | Status: DC

## 2023-10-04 NOTE — Telephone Encounter (Signed)
 Spoke with pt regarding lab results and her medications. Per Dr. Krasowski, stop Metolazone . Start Lasix 40mg  1 tablet daily, Potassium 20mEq 3 times daily. Pt verbalized understanding and will come for BMP in 1 week.

## 2023-10-09 ENCOUNTER — Ambulatory Visit: Attending: Cardiology

## 2023-10-09 DIAGNOSIS — R0609 Other forms of dyspnea: Secondary | ICD-10-CM | POA: Diagnosis not present

## 2023-10-09 LAB — ECHOCARDIOGRAM COMPLETE
Area-P 1/2: 2.81 cm2
S' Lateral: 3 cm

## 2023-10-10 ENCOUNTER — Encounter: Payer: Self-pay | Admitting: Cardiology

## 2023-10-10 ENCOUNTER — Ambulatory Visit: Attending: Cardiology | Admitting: Cardiology

## 2023-10-10 VITALS — BP 124/62 | HR 76 | Ht 64.0 in | Wt 178.6 lb

## 2023-10-10 DIAGNOSIS — I10 Essential (primary) hypertension: Secondary | ICD-10-CM

## 2023-10-10 DIAGNOSIS — I480 Paroxysmal atrial fibrillation: Secondary | ICD-10-CM

## 2023-10-10 DIAGNOSIS — E785 Hyperlipidemia, unspecified: Secondary | ICD-10-CM

## 2023-10-10 NOTE — Patient Instructions (Addendum)
 Medication Instructions:  Your physician recommends that you continue on your current medications as directed. Please refer to the Current Medication list given to you today.  *If you need a refill on your cardiac medications before your next appointment, please call your pharmacy*   Lab Work: Lab work next week If you have labs (blood work) drawn today and your tests are completely normal, you will receive your results only by: MyChart Message (if you have MyChart) OR A paper copy in the mail If you have any lab test that is abnormal or we need to change your treatment, we will call you to review the results.   Testing/Procedures: None Ordered   Follow-Up: At [facility], you and your health needs are our priority.  As part of our continuing mission to provide you with exceptional heart care, we have created designated Provider Care Teams.  These Care Teams include your primary Cardiologist (physician) and Advanced Practice Providers (APPs -  Physician Assistants and Nurse Practitioners) who all work together to provide you with the care you need, when you need it.  We recommend signing up for the patient portal called MyChart.  Sign up information is provided on this After Visit Summary.  MyChart is used to connect with patients for Virtual Visits (Telemedicine).  Patients are able to view lab/test results, encounter notes, upcoming appointments, etc.  Non-urgent messages can be sent to your provider as well.   To learn more about what you can do with MyChart, go to [email or web address].    Your next appointment:   3 month(s)  The format for your next appointment:   In Person  Provider:   Ralene Burger, MD    Other Instructions NA

## 2023-10-10 NOTE — Progress Notes (Signed)
 Cardiology Office Note:    Date:  10/10/2023   ID:  Tina Curry, DOB 1940-07-02, MRN [id number]  PCP:  Tina, Renford Cartwright, MD  Cardiologist:  Ralene Burger, MD    Referring MD: Tina, Renford Curry, *   Chief Complaint  Patient presents with   Follow-up    History of Present Illness:    Tina Curry is a 83 y.o. female  Past medical history significant for coronary disease November 2024 she got coronary CT angio done which showed only mild disease.  Additional problem include atrial fibrillation she did have TEE and cardioversion March 25, 2023, dyslipidemia, first-degree AV block, obstructive sleep apnea.  Last time I seen her concern was about swelling of lower extremities.  That seems to be better after dose of amlodipine  has been reduced.  She did have echocardiogram done which showed normal left ventricle ejection fraction, she does have diastolic dysfunction.  proBNP was also elevated.  Comes today to discuss that  Past Medical History:  Diagnosis Date   Allergy to alpha-gal    Aortic valve sclerosis 05/12/2021   Atrial fibrillation (HCC) 02/21/2023   Chronic cough 11/20/2021   Coronary artery disease 03/07/2023   Dyslipidemia 12/26/2016   Episodic lightheadedness 08/26/2017   Essential hypertension 07/13/2009   Qualifier: Diagnosis of   By: Verdia Glad CMA, Jessica      IMO SNOMED Dx Update Oct 2024     Goiter 12/15/2009   Qualifier: Diagnosis of   By: Villa Greaser MD, Jennifer Moellers      IMO SNOMED Dx Update Oct 2024     Leg pain, bilateral 01/15/2018   Malaise and fatigue 11/03/2018   Meningitis    Murmur 03/06/2017   OSA (obstructive sleep apnea) 11/08/2011   PSG 10/12 mild, 9/h     Other and unspecified hyperlipidemia    Other chest pain 05/12/2021   Palpitations 12/26/2016   Paroxysmal atrial fibrillation (HCC) 03/07/2023   Peripheral vascular disease (HCC) 12/26/2016   Persistent atrial fibrillation (HCC) 03/07/2023   Viral upper respiratory tract infection 12/26/2016     Past Surgical History:  Procedure Laterality Date   BACK SURGERY  1990 and 2001   bladdr tack  03/06/2004   CATARACT EXTRACTION Left 2011   CATARACT EXTRACTION Right 2017   CHOLECYSTECTOMY  02/18/2009   EYE SURGERY     left   FOOT SURGERY Left 1992   left   TUBAL LIGATION  04/23/1972   VESICOVAGINAL FISTULA CLOSURE W/ TAH  04/23/1992   VITRECTOMY Left 2010   left eye    Current Medications: Current Meds  Medication Sig   amLODipine  (NORVASC ) 5 MG tablet Take 1 tablet (5 mg total) by mouth daily.   cloNIDine (CATAPRES) 0.2 MG tablet Take 0.2 mg by mouth daily.   Docusate Calcium (STOOL SOFTENER PO) Take 1 tablet by mouth daily.   EPINEPHrine 0.3 mg/0.3 mL IJ SOAJ injection Inject 0.3 mg into the muscle as needed for anaphylaxis.   Ferrous Sulfate (IRON) 325 (65 Fe) MG TABS Take 1 tablet by mouth daily.   furosemide (LASIX) 40 MG tablet Take 1 tablet (40 mg total) by mouth daily.   ipratropium (ATROVENT ) 0.06 % nasal spray Can use two sprays in each nostril every six hours to dry up runny nose. (Patient taking differently: Place 2 sprays into both nostrils 4 (four) times daily. Can use two sprays in each nostril every six hours to dry up runny nose.)   magnesium oxide (MAG-OX) 400 (240 Mg) MG tablet Take  400 mg by mouth daily.   metoprolol  tartrate (LOPRESSOR ) 25 MG tablet Take 2 tablets (50 mg total) by mouth 2 (two) times daily.   montelukast (SINGULAIR) 10 MG tablet Take 10 mg by mouth at bedtime.   nitrofurantoin (MACRODANTIN) 100 MG capsule Take 100 mg by mouth at bedtime.   omeprazole (PRILOSEC) 40 MG capsule Take 40 mg by mouth daily.   potassium chloride  SA (KLOR-CON  M) 20 MEQ tablet Take 20 mEq by mouth 3 (three) times daily.   pravastatin  (PRAVACHOL ) 40 MG tablet Take 1 tablet (40 mg total) by mouth daily.   Probiotic Product (DAILY PROBIOTIC PO) Take 1 tablet by mouth daily. Women's Probiotic   rivaroxaban  (XARELTO ) 20 MG TABS tablet Take 1 tablet (20 mg total) by  mouth daily with supper.   vitamin C (ASCORBIC ACID) 500 MG tablet Take 1,000 mg by mouth daily.   Zinc Acetate 50 MG CAPS Take 1 capsule by mouth daily.     Allergies:   Eliquis [apixaban], Losartan, Alpha-gal, Cephalexin, Clarithromycin, Iodine, Levofloxacin, Povidone-iodine, Ramipril, Tequin [gatifloxacin], Atorvastatin, Bee venom, Iodinated contrast media, and Kiwi extract   Social History   Socioeconomic History   Marital status: Married    Spouse name: Not on file   Number of children: Not on file   Years of education: Not on file   Highest education level: Not on file  Occupational History   Occupation: housewife    Employer: RETIRED  Tobacco Use   Smoking status: Never    Passive exposure: Past   Smokeless tobacco: Former    Types: Snuff  Substance and Sexual Activity   Alcohol use: No   Drug use: No   Sexual activity: Not on file  Other Topics Concern   Not on file  Social History Narrative   Not on file   Social Drivers of Health   Financial Resource Strain: Not on file  Food Insecurity: Not on file  Transportation Needs: Not on file  Physical Activity: Not on file  Stress: Not on file  Social Connections: Not on file     Family History: The patient's family history includes Heart attack in her father and mother; Other in her brother and sister; Stroke in her father. ROS:   Please see the history of present illness.    All 14 point review of systems negative except as described per history of present illness  EKGs/Labs/Other Studies Reviewed:         Recent Labs: 04/09/2023: Magnesium 1.8 04/30/2023: ALT 15 05/29/2023: Hemoglobin 12.0; Platelets 328; TSH 2.120 09/12/2023: BUN 12; Creatinine, Ser 0.92; NT-Pro BNP 969; Potassium 3.5; Sodium 139  Recent Lipid Panel    Component Value Date/Time   CHOL 199 04/30/2023 0948   TRIG 181 (H) 04/30/2023 0948   HDL 55 04/30/2023 0948   CHOLHDL 3.6 04/30/2023 0948   LDLCALC 113 (H) 04/30/2023 0948     Physical Exam:    VS:  BP 124/62 (BP Location: Right Arm, Patient Position: Sitting)   Pulse 76   Ht 5' 4 (1.626 m)   Wt 178 lb 9.6 oz (81 kg)   SpO2 92%   BMI 30.66 kg/m     Wt Readings from Last 3 Encounters:  10/10/23 178 lb 9.6 oz (81 kg)  09/12/23 181 lb 3.2 oz (82.2 kg)  07/16/23 181 lb 3.2 oz (82.2 kg)     GEN:  Well nourished, well developed in no acute distress HEENT: Normal NECK: No JVD; No carotid bruits LYMPHATICS:  No lymphadenopathy CARDIAC: RRR, no murmurs, no rubs, no gallops RESPIRATORY:  Clear to auscultation without rales, wheezing or rhonchi  ABDOMEN: Soft, non-tender, non-distended MUSCULOSKELETAL:  No edema; No deformity  SKIN: Warm and dry LOWER EXTREMITIES: no swelling NEUROLOGIC:  Alert and oriented x 3 PSYCHIATRIC:  Normal affect   ASSESSMENT:    1. Essential hypertension   2. Paroxysmal atrial fibrillation (HCC)   3. Dyslipidemia    PLAN:    In order of problems listed above:  Diastolic dysfunction, we will put her on small dose of diuretics to see if it helps with her symptoms.  Will check Chem-7 later. Paroxysmal atrial fibrillation maintained sinus rhythm, continue anticoagulation. Dyslipidemia I did review K PN which show LDL 113 HDL 48, she is atorvastatin which will be increased.  I will visit thank you   Medication Adjustments/Labs and Tests Ordered: Current medicines are reviewed at length with the patient today.  Concerns regarding medicines are outlined above.  No orders of the defined types were placed in this encounter.  Medication changes: No orders of the defined types were placed in this encounter.   Signed, Manfred Seed, MD, [facility] 10/10/2023 4:41 PM    [facility]

## 2023-10-15 DIAGNOSIS — R0609 Other forms of dyspnea: Secondary | ICD-10-CM | POA: Diagnosis not present

## 2023-10-16 LAB — BASIC METABOLIC PANEL WITH GFR
BUN/Creatinine Ratio: 17 (ref 12–28)
BUN: 16 mg/dL (ref 8–27)
CO2: 26 mmol/L (ref 20–29)
Calcium: 9.4 mg/dL (ref 8.7–10.3)
Chloride: 101 mmol/L (ref 96–106)
Creatinine, Ser: 0.93 mg/dL (ref 0.57–1.00)
Glucose: 116 mg/dL — ABNORMAL HIGH (ref 70–99)
Potassium: 3.9 mmol/L (ref 3.5–5.2)
Sodium: 141 mmol/L (ref 134–144)
eGFR: 61 mL/min/{1.73_m2} (ref >59)

## 2023-10-19 ENCOUNTER — Other Ambulatory Visit: Payer: Self-pay | Admitting: Cardiology

## 2023-10-19 ENCOUNTER — Other Ambulatory Visit: Payer: Self-pay | Admitting: Allergy and Immunology

## 2023-10-19 DIAGNOSIS — M109 Gout, unspecified: Secondary | ICD-10-CM | POA: Diagnosis not present

## 2023-10-21 ENCOUNTER — Telehealth: Payer: Self-pay

## 2023-10-21 NOTE — Telephone Encounter (Signed)
 Left message on My Chart with Echo results per Dr. Vanetta Shawl note. Routed to PCP.

## 2023-10-21 NOTE — Telephone Encounter (Signed)
 Left message on My Chart with lab results per Dr. Vanetta Shawl note. Routed to PCP.

## 2023-10-21 NOTE — Telephone Encounter (Signed)
 Rx refill sent to pharmacy.

## 2023-10-23 DIAGNOSIS — N390 Urinary tract infection, site not specified: Secondary | ICD-10-CM | POA: Diagnosis not present

## 2023-10-23 DIAGNOSIS — R8290 Unspecified abnormal findings in urine: Secondary | ICD-10-CM | POA: Diagnosis not present

## 2023-11-11 ENCOUNTER — Telehealth: Payer: Self-pay

## 2023-11-11 NOTE — Telephone Encounter (Signed)
 Lab Results reviewed with pt as per Dr. Vanetta Shawl note.  Pt verbalized understanding and had no additional questions. Routed to PCP

## 2023-11-11 NOTE — Telephone Encounter (Signed)
 Echo Results reviewed with pt as per Dr. Vanetta Shawl note.  Pt verbalized understanding and had no additional questions. Routed to PCP

## 2023-11-21 ENCOUNTER — Encounter (HOSPITAL_COMMUNITY): Payer: Self-pay | Admitting: Internal Medicine

## 2023-11-21 ENCOUNTER — Telehealth: Payer: Self-pay | Admitting: Cardiology

## 2023-11-21 ENCOUNTER — Inpatient Hospital Stay (HOSPITAL_COMMUNITY)
Admission: EM | Admit: 2023-11-21 | Discharge: 2023-11-27 | DRG: 243 | Disposition: A | Discharge status: Home / Self Care | Source: Other Acute Inpatient Hospital | Attending: Cardiovascular Disease | Admitting: Cardiovascular Disease

## 2023-11-21 ENCOUNTER — Encounter (HOSPITAL_COMMUNITY): Payer: Self-pay

## 2023-11-21 ENCOUNTER — Telehealth: Payer: Self-pay

## 2023-11-21 ENCOUNTER — Other Ambulatory Visit: Payer: Self-pay

## 2023-11-21 DIAGNOSIS — Z1152 Encounter for screening for COVID-19: Secondary | ICD-10-CM | POA: Diagnosis not present

## 2023-11-21 DIAGNOSIS — M79662 Pain in left lower leg: Secondary | ICD-10-CM | POA: Diagnosis not present

## 2023-11-21 DIAGNOSIS — Z91030 Bee allergy status: Secondary | ICD-10-CM

## 2023-11-21 DIAGNOSIS — E785 Hyperlipidemia, unspecified: Secondary | ICD-10-CM | POA: Diagnosis not present

## 2023-11-21 DIAGNOSIS — Z7901 Long term (current) use of anticoagulants: Secondary | ICD-10-CM | POA: Diagnosis not present

## 2023-11-21 DIAGNOSIS — I358 Other nonrheumatic aortic valve disorders: Secondary | ICD-10-CM | POA: Diagnosis not present

## 2023-11-21 DIAGNOSIS — I4891 Unspecified atrial fibrillation: Secondary | ICD-10-CM | POA: Diagnosis not present

## 2023-11-21 DIAGNOSIS — A419 Sepsis, unspecified organism: Secondary | ICD-10-CM | POA: Diagnosis not present

## 2023-11-21 DIAGNOSIS — Z823 Family history of stroke: Secondary | ICD-10-CM | POA: Diagnosis not present

## 2023-11-21 DIAGNOSIS — R9431 Abnormal electrocardiogram [ECG] [EKG]: Secondary | ICD-10-CM | POA: Diagnosis not present

## 2023-11-21 DIAGNOSIS — Z8249 Family history of ischemic heart disease and other diseases of the circulatory system: Secondary | ICD-10-CM

## 2023-11-21 DIAGNOSIS — I495 Sick sinus syndrome: Secondary | ICD-10-CM | POA: Diagnosis present

## 2023-11-21 DIAGNOSIS — Z91014 Allergy to mammalian meats: Secondary | ICD-10-CM | POA: Diagnosis not present

## 2023-11-21 DIAGNOSIS — I4819 Other persistent atrial fibrillation: Secondary | ICD-10-CM | POA: Diagnosis not present

## 2023-11-21 DIAGNOSIS — Z91041 Radiographic dye allergy status: Secondary | ICD-10-CM | POA: Diagnosis not present

## 2023-11-21 DIAGNOSIS — Z950 Presence of cardiac pacemaker: Secondary | ICD-10-CM | POA: Diagnosis not present

## 2023-11-21 DIAGNOSIS — I5030 Unspecified diastolic (congestive) heart failure: Secondary | ICD-10-CM | POA: Diagnosis present

## 2023-11-21 DIAGNOSIS — I10 Essential (primary) hypertension: Secondary | ICD-10-CM

## 2023-11-21 DIAGNOSIS — I480 Paroxysmal atrial fibrillation: Secondary | ICD-10-CM | POA: Diagnosis not present

## 2023-11-21 DIAGNOSIS — Z881 Allergy status to other antibiotic agents status: Secondary | ICD-10-CM

## 2023-11-21 DIAGNOSIS — R918 Other nonspecific abnormal finding of lung field: Secondary | ICD-10-CM | POA: Diagnosis not present

## 2023-11-21 DIAGNOSIS — I441 Atrioventricular block, second degree: Secondary | ICD-10-CM

## 2023-11-21 DIAGNOSIS — I160 Hypertensive urgency: Secondary | ICD-10-CM | POA: Diagnosis not present

## 2023-11-21 DIAGNOSIS — I110 Hypertensive heart disease with heart failure: Secondary | ICD-10-CM | POA: Diagnosis present

## 2023-11-21 DIAGNOSIS — I739 Peripheral vascular disease, unspecified: Secondary | ICD-10-CM | POA: Diagnosis present

## 2023-11-21 DIAGNOSIS — Z7401 Bed confinement status: Secondary | ICD-10-CM | POA: Diagnosis not present

## 2023-11-21 DIAGNOSIS — Z743 Need for continuous supervision: Secondary | ICD-10-CM | POA: Diagnosis not present

## 2023-11-21 DIAGNOSIS — I2510 Atherosclerotic heart disease of native coronary artery without angina pectoris: Secondary | ICD-10-CM | POA: Diagnosis present

## 2023-11-21 DIAGNOSIS — I442 Atrioventricular block, complete: Principal | ICD-10-CM | POA: Diagnosis present

## 2023-11-21 DIAGNOSIS — Z91018 Allergy to other foods: Secondary | ICD-10-CM

## 2023-11-21 DIAGNOSIS — M7989 Other specified soft tissue disorders: Secondary | ICD-10-CM | POA: Diagnosis not present

## 2023-11-21 DIAGNOSIS — J90 Pleural effusion, not elsewhere classified: Secondary | ICD-10-CM | POA: Diagnosis not present

## 2023-11-21 DIAGNOSIS — R509 Fever, unspecified: Secondary | ICD-10-CM | POA: Diagnosis not present

## 2023-11-21 DIAGNOSIS — R600 Localized edema: Secondary | ICD-10-CM | POA: Diagnosis not present

## 2023-11-21 DIAGNOSIS — R0602 Shortness of breath: Secondary | ICD-10-CM | POA: Diagnosis not present

## 2023-11-21 DIAGNOSIS — Z8661 Personal history of infections of the central nervous system: Secondary | ICD-10-CM

## 2023-11-21 DIAGNOSIS — R001 Bradycardia, unspecified: Secondary | ICD-10-CM | POA: Diagnosis not present

## 2023-11-21 DIAGNOSIS — Z96641 Presence of right artificial hip joint: Secondary | ICD-10-CM | POA: Diagnosis present

## 2023-11-21 DIAGNOSIS — Z888 Allergy status to other drugs, medicaments and biological substances status: Secondary | ICD-10-CM

## 2023-11-21 DIAGNOSIS — R0600 Dyspnea, unspecified: Secondary | ICD-10-CM | POA: Diagnosis not present

## 2023-11-21 DIAGNOSIS — I5032 Chronic diastolic (congestive) heart failure: Secondary | ICD-10-CM | POA: Diagnosis present

## 2023-11-21 DIAGNOSIS — R053 Chronic cough: Secondary | ICD-10-CM | POA: Diagnosis not present

## 2023-11-21 DIAGNOSIS — Z79899 Other long term (current) drug therapy: Secondary | ICD-10-CM | POA: Diagnosis not present

## 2023-11-21 DIAGNOSIS — I440 Atrioventricular block, first degree: Secondary | ICD-10-CM | POA: Diagnosis not present

## 2023-11-21 DIAGNOSIS — J9811 Atelectasis: Secondary | ICD-10-CM | POA: Diagnosis not present

## 2023-11-21 DIAGNOSIS — I509 Heart failure, unspecified: Secondary | ICD-10-CM | POA: Diagnosis not present

## 2023-11-21 DIAGNOSIS — Z87891 Personal history of nicotine dependence: Secondary | ICD-10-CM

## 2023-11-21 DIAGNOSIS — Z96651 Presence of right artificial knee joint: Secondary | ICD-10-CM | POA: Diagnosis present

## 2023-11-21 DIAGNOSIS — R531 Weakness: Secondary | ICD-10-CM | POA: Diagnosis not present

## 2023-11-21 HISTORY — DX: Atrioventricular block, second degree: I44.1

## 2023-11-21 MED ORDER — ACETAMINOPHEN 325 MG PO TABS
650.0000 mg | ORAL_TABLET | ORAL | Status: DC | PRN
Start: 2023-11-21 — End: 2023-11-27
  Administered 2023-11-21 – 2023-11-26 (×5): 650 mg via ORAL
  Filled 2023-11-21 (×5): qty 2

## 2023-11-21 MED ORDER — PANTOPRAZOLE SODIUM 40 MG PO TBEC
40.0000 mg | DELAYED_RELEASE_TABLET | Freq: Every day | ORAL | Status: DC
  Administered 2023-11-21 – 2023-11-27 (×7): 40 mg via ORAL
  Filled 2023-11-21 (×7): qty 1

## 2023-11-21 MED ORDER — PRAVASTATIN SODIUM 40 MG PO TABS
40.0000 mg | ORAL_TABLET | Freq: Every day | ORAL | Status: DC
  Administered 2023-11-21 – 2023-11-27 (×6): 40 mg via ORAL
  Filled 2023-11-21 (×7): qty 1

## 2023-11-21 MED ORDER — ONDANSETRON HCL 4 MG/2ML IJ SOLN: 4.0000 mg | Freq: Four times a day (QID) | INTRAMUSCULAR | Status: DC | PRN

## 2023-11-21 MED ORDER — AMLODIPINE BESYLATE 5 MG PO TABS
5.0000 mg | ORAL_TABLET | Freq: Every day | ORAL | Status: DC
  Administered 2023-11-21 – 2023-11-27 (×7): 5 mg via ORAL
  Filled 2023-11-21 (×7): qty 1

## 2023-11-21 MED ORDER — CLONIDINE HCL 0.2 MG PO TABS
0.2000 mg | ORAL_TABLET | Freq: Every day | ORAL | Status: DC
  Administered 2023-11-21 – 2023-11-27 (×5): 0.2 mg via ORAL
  Filled 2023-11-21 (×7): qty 1

## 2023-11-21 NOTE — Telephone Encounter (Signed)
 Pt c/o Shortness Of Breath: STAT if SOB developed within the last 24 hours or pt is noticeably SOB on the phone  1. Are you currently SOB (can you hear that pt is SOB on the phone)? No  2. How long have you been experiencing SOB? Yesterday  3. Are you SOB when sitting or when up moving around? Moving around  4. Are you currently experiencing any other symptoms?  HR low  STAT if HR is under 50 or over 120 (normal HR is 60-100 beats per minute)  What is your heart rate? 36  Do you have a log of your heart rate readings (document readings)? No  Do you have any other symptoms? Shaky, weak Call transferred

## 2023-11-21 NOTE — Plan of Care (Signed)
   Problem: Education: Goal: Knowledge of General Education information will improve Description Including pain rating scale, medication(s)/side effects and non-pharmacologic comfort measures Outcome: Progressing

## 2023-11-21 NOTE — H&P (Signed)
 Physician History and Physical     Patient ID: Tina Curry MRN: [id number] DOB/AGE: October 30, 1940 83 y.o. Admit date: 11/21/2023  Primary Care Physician: Street, Lonni HERO, MD Primary Cardiologist: Bernie    HPI:  83 y.o. with history of HTN, PAF, non obstructive CAD. Sent from [place] by Dr Madireddy for high grade heart block. She has been on Xarelto  last dose yesterday and lopressor  50 mg bid. She has felt weak and some postural symptoms for a week. Felt even worse today In [place] HR;s in 30's. On arrival hear has high grade AV block 2:1 and CHB with rates 50-55 bpm BP stable 160 mmHg systolic. No chest pain, dyspnea or frank syncope. TSH and K/Cr normal earlier this year Baseline ECG from [place] shows SR with first degree AV block rates in 70's   Review of systems complete and found to be negative unless listed above   Past Medical History:  Diagnosis Date   Allergy to alpha-gal    Aortic valve sclerosis 05/12/2021   Atrial fibrillation (HCC) 02/21/2023   Chronic cough 11/20/2021   Coronary artery disease 03/07/2023   Dyslipidemia 12/26/2016   Episodic lightheadedness 08/26/2017   Essential hypertension 07/13/2009   Qualifier: Diagnosis of   By: Lang CMA, Jessica      IMO SNOMED Dx Update Oct 2024     Goiter 12/15/2009   Qualifier: Diagnosis of   By: Jude MD, Harden ROCKFORD      IMO SNOMED Dx Update Oct 2024     Leg pain, bilateral 01/15/2018   Malaise and fatigue 11/03/2018   Meningitis    Murmur 03/06/2017   OSA (obstructive sleep apnea) 11/08/2011   PSG 10/12 mild, 9/h     Other and unspecified hyperlipidemia    Other chest pain 05/12/2021   Palpitations 12/26/2016   Paroxysmal atrial fibrillation (HCC) 03/07/2023   Peripheral vascular disease (HCC) 12/26/2016   Persistent atrial fibrillation (HCC) 03/07/2023   Viral upper respiratory tract infection 12/26/2016    Family History  Problem Relation Age of Onset   Heart attack Mother    Heart attack Father    Stroke  Father    Other Sister        Brain tumor   Other Brother        Brain tumor    Social History   Socioeconomic History   Marital status: Married    Spouse name: Not on file   Number of children: Not on file   Years of education: Not on file   Highest education level: Not on file  Occupational History   Occupation: housewife    Employer: RETIRED  Tobacco Use   Smoking status: Never    Passive exposure: Past   Smokeless tobacco: Former    Types: Snuff  Substance and Sexual Activity   Alcohol use: No   Drug use: No   Sexual activity: Not on file  Other Topics Concern   Not on file  Social History Narrative   Not on file   Social Drivers of Health   Financial Resource Strain: Not on file  Food Insecurity: Not on file  Transportation Needs: Not on file  Physical Activity: Not on file  Stress: Not on file  Social Connections: Not on file  Intimate Partner Violence: Not on file    Past Surgical History:  Procedure Laterality Date   BACK SURGERY  1990 and 2001   bladdr tack  03/06/2004   CATARACT EXTRACTION Left 2011   CATARACT EXTRACTION Right  2017   CHOLECYSTECTOMY  02/18/2009   EYE SURGERY     left   FOOT SURGERY Left 1992   left   TUBAL LIGATION  04/23/1972   VESICOVAGINAL FISTULA CLOSURE W/ TAH  04/23/1992   VITRECTOMY Left 2010   left eye     Medications Prior to Admission  Medication Sig Dispense Refill Last Dose/Taking   amLODipine  (NORVASC ) 5 MG tablet Take 1 tablet (5 mg total) by mouth daily. 90 tablet 3    cloNIDine  (CATAPRES ) 0.2 MG tablet Take 0.2 mg by mouth daily.      Docusate Calcium (STOOL SOFTENER PO) Take 1 tablet by mouth daily.      EPINEPHrine 0.3 mg/0.3 mL IJ SOAJ injection Inject 0.3 mg into the muscle as needed for anaphylaxis.      Ferrous Sulfate (IRON) 325 (65 Fe) MG TABS Take 1 tablet by mouth daily.      furosemide  (LASIX ) 40 MG tablet TAKE 1 TABLET BY MOUTH EVERY DAY 90 tablet 3    ipratropium (ATROVENT ) 0.06 % nasal spray CAN  USE TWO SPRAYS IN EACH NOSTRIL EVERY SIX HOURS TO DRY UP RUNNY NOSE. 45 mL 1    magnesium oxide (MAG-OX) 400 (240 Mg) MG tablet Take 400 mg by mouth daily.      metoprolol  tartrate (LOPRESSOR ) 25 MG tablet Take 2 tablets (50 mg total) by mouth 2 (two) times daily.      montelukast (SINGULAIR) 10 MG tablet Take 10 mg by mouth at bedtime.      nitrofurantoin (MACRODANTIN) 100 MG capsule Take 100 mg by mouth at bedtime.      omeprazole (PRILOSEC) 40 MG capsule Take 40 mg by mouth daily.      potassium chloride  SA (KLOR-CON  M) 20 MEQ tablet Take 20 mEq by mouth 3 (three) times daily.      pravastatin  (PRAVACHOL ) 40 MG tablet Take 1 tablet (40 mg total) by mouth daily. 90 tablet 3    Probiotic Product (DAILY PROBIOTIC PO) Take 1 tablet by mouth daily. Women's Probiotic      rivaroxaban  (XARELTO ) 20 MG TABS tablet Take 1 tablet (20 mg total) by mouth daily with supper. 30 tablet 12    vitamin C (ASCORBIC ACID) 500 MG tablet Take 1,000 mg by mouth daily.      Zinc Acetate 50 MG CAPS Take 1 capsule by mouth daily.       Physical Exam: There were no vitals taken for this visit.     Elderly female no distress Lungs clear SEM 3/6 Abdomen benign Post right THR/ right TKR LE varicosities with plus one bilateral edema  No current facility-administered medications on file prior to encounter.   Current Outpatient Medications on File Prior to Encounter  Medication Sig Dispense Refill   amLODipine  (NORVASC ) 5 MG tablet Take 1 tablet (5 mg total) by mouth daily. 90 tablet 3   cloNIDine  (CATAPRES ) 0.2 MG tablet Take 0.2 mg by mouth daily.     Docusate Calcium (STOOL SOFTENER PO) Take 1 tablet by mouth daily.     EPINEPHrine 0.3 mg/0.3 mL IJ SOAJ injection Inject 0.3 mg into the muscle as needed for anaphylaxis.     Ferrous Sulfate (IRON) 325 (65 Fe) MG TABS Take 1 tablet by mouth daily.     furosemide  (LASIX ) 40 MG tablet TAKE 1 TABLET BY MOUTH EVERY DAY 90 tablet 3   ipratropium (ATROVENT ) 0.06 %  nasal spray CAN USE TWO SPRAYS IN EACH NOSTRIL EVERY SIX HOURS TO  DRY UP RUNNY NOSE. 45 mL 1   magnesium oxide (MAG-OX) 400 (240 Mg) MG tablet Take 400 mg by mouth daily.     metoprolol  tartrate (LOPRESSOR ) 25 MG tablet Take 2 tablets (50 mg total) by mouth 2 (two) times daily.     montelukast (SINGULAIR) 10 MG tablet Take 10 mg by mouth at bedtime.     nitrofurantoin (MACRODANTIN) 100 MG capsule Take 100 mg by mouth at bedtime.     omeprazole (PRILOSEC) 40 MG capsule Take 40 mg by mouth daily.     potassium chloride  SA (KLOR-CON  M) 20 MEQ tablet Take 20 mEq by mouth 3 (three) times daily.     pravastatin  (PRAVACHOL ) 40 MG tablet Take 1 tablet (40 mg total) by mouth daily. 90 tablet 3   Probiotic Product (DAILY PROBIOTIC PO) Take 1 tablet by mouth daily. Women's Probiotic     rivaroxaban  (XARELTO ) 20 MG TABS tablet Take 1 tablet (20 mg total) by mouth daily with supper. 30 tablet 12   vitamin C (ASCORBIC ACID) 500 MG tablet Take 1,000 mg by mouth daily.     Zinc Acetate 50 MG CAPS Take 1 capsule by mouth daily.      Labs:   Lab Results  Component Value Date   WBC 9.6 05/29/2023   HGB 12.0 05/29/2023   HCT 38.7 05/29/2023   MCV 82 05/29/2023   PLT 328 05/29/2023   No results for input(s): NA, K, CL, CO2, BUN, CREATININE, CALCIUM, PROT, BILITOT, ALKPHOS, ALT, AST, GLUCOSE in the last 168 hours.  Invalid input(s): LABALBU No results found for: CKTOTAL, CKMB, CKMBINDEX, TROPONINI   Lab Results  Component Value Date   CHOL 199 04/30/2023   CHOL 174 02/28/2023   Lab Results  Component Value Date   HDL 55 04/30/2023   HDL 41 02/28/2023   Lab Results  Component Value Date   LDLCALC 113 (H) 04/30/2023   LDLCALC 107 (H) 02/28/2023   Lab Results  Component Value Date   TRIG 181 (H) 04/30/2023   TRIG 147 02/28/2023   Lab Results  Component Value Date   CHOLHDL 3.6 04/30/2023   CHOLHDL 4.2 02/28/2023   No results found for: LDLDIRECT      Radiology: No results found.  EKG: pending Telemetry with HR 50's high grade heart block 2:1 CHB  ASSESSMENT AND PLAN:  High grade AV block:  symptomatic in patient with PAF and SSS baseline ECG first degree. Hold lopressor  and xarelto . Check labs TSH (normal 05/29/23 ) and CBC/BMET EP to see in am. I think she will need a PPM regardless of lopressor  wash out. Not clear what EP will do may want to wait till Monday off beta blocker ECG pending from arrival to [facility]  PAF:  in NSR ok to hold Xarelto  with no heparin CAD:  non obstructive on CTA 03/18/23 CAD RADS 2 25-49% LAD high calcium score 332 , 69 th percentile no agnina or chest pain HTN:  continue clonidine , lasix  and amlodipine  can add hydralazine if needed but would not be too aggressive at lowering in setting of CHB HLD:  continue pravastatin  6.   Murmur:  Echo done 10/09/23 EF 60-65% no significant valve        Dx suspect AV sclerosis Note made of small posterior        Pericardial effusion  Signed: Maude Nishan7/31/2025, 5:48 PM

## 2023-11-21 NOTE — Plan of Care (Signed)

## 2023-11-21 NOTE — Telephone Encounter (Signed)
 Call from Call center - Pt stated that her HR was 39 and she felt weak and shakey. Per Dr. Krasowski she could come in for an EKG or go to the ED. Advised to go to ED if she doesn't feel better when she gets up and and dressed. If she feels better and her HR is up she will come by office for EKG. Pt verbalized understanding and had no further questions.

## 2023-11-21 NOTE — Telephone Encounter (Signed)
 See previous note

## 2023-11-22 ENCOUNTER — Inpatient Hospital Stay (HOSPITAL_COMMUNITY)

## 2023-11-22 DIAGNOSIS — I441 Atrioventricular block, second degree: Secondary | ICD-10-CM | POA: Diagnosis not present

## 2023-11-22 LAB — BASIC METABOLIC PANEL WITH GFR
Anion gap: 9 (ref 5–15)
BUN: 14 mg/dL (ref 8–23)
CO2: 26 mmol/L (ref 22–32)
Calcium: 8.7 mg/dL — ABNORMAL LOW (ref 8.9–10.3)
Chloride: 105 mmol/L (ref 98–111)
Creatinine, Ser: 1.03 mg/dL — ABNORMAL HIGH (ref 0.44–1.00)
GFR, Estimated: 54 mL/min — ABNORMAL LOW (ref >60)
Glucose, Bld: 98 mg/dL (ref 70–99)
Potassium: 3.5 mmol/L (ref 3.5–5.1)
Sodium: 140 mmol/L (ref 135–145)

## 2023-11-22 LAB — CBC
HCT: 34.5 % — ABNORMAL LOW (ref 36.0–46.0)
Hemoglobin: 11 g/dL — ABNORMAL LOW (ref 12.0–15.0)
MCH: 26.4 pg (ref 26.0–34.0)
MCHC: 31.9 g/dL (ref 30.0–36.0)
MCV: 82.9 fL (ref 80.0–100.0)
Platelets: 209 K/uL (ref 150–400)
RBC: 4.16 MIL/uL (ref 3.87–5.11)
RDW: 18 % — ABNORMAL HIGH (ref 11.5–15.5)
WBC: 7.3 K/uL (ref 4.0–10.5)
nRBC: 0 % (ref 0.0–0.2)

## 2023-11-22 LAB — TSH: TSH: 1.824 u[IU]/mL (ref 0.350–4.500)

## 2023-11-22 MED ORDER — OXYCODONE HCL 5 MG PO TABS
5.0000 mg | ORAL_TABLET | Freq: Four times a day (QID) | ORAL | Status: DC | PRN
  Administered 2023-11-22: 5 mg via ORAL
  Filled 2023-11-22: qty 1

## 2023-11-22 MED ORDER — POTASSIUM CHLORIDE CRYS ER 20 MEQ PO TBCR
60.0000 meq | EXTENDED_RELEASE_TABLET | Freq: Once | ORAL | Status: AC
  Administered 2023-11-22: 60 meq via ORAL
  Filled 2023-11-22: qty 3

## 2023-11-22 MED ORDER — MELATONIN 5 MG PO TABS
5.0000 mg | ORAL_TABLET | Freq: Once | ORAL | Status: AC
  Administered 2023-11-22: 5 mg via ORAL
  Filled 2023-11-22: qty 1

## 2023-11-22 MED ORDER — FUROSEMIDE 10 MG/ML IJ SOLN
40.0000 mg | Freq: Two times a day (BID) | INTRAMUSCULAR | Status: DC
  Administered 2023-11-22 – 2023-11-27 (×10): 40 mg via INTRAVENOUS
  Filled 2023-11-22 (×10): qty 4

## 2023-11-22 NOTE — Progress Notes (Signed)
 RN calling patient having fever 100F, c/o left leg intense pain, redness  -> blood c/s ordered -> no cellulitis on exam -> US  lower extremity ordered to r/o DVT given slight swelling and varicocities -> Urine analysis ordered -> Holding off antibiotics  -> if recurrent fever- please consult medicine team

## 2023-11-22 NOTE — Progress Notes (Addendum)
   Cardiologist:  Bernie  Subjective:  Still with dyspnea  Objective:  Vitals:   11/22/23 0009 11/22/23 0417 11/22/23 0758 11/22/23 1141  BP: (!) 123/50 (!) 120/59 (!) 127/50 (!) 151/46  Pulse: (!) 44 74 (!) 36 (!) 53  Resp: 20 19 19 19   Temp: 98.8 F (37.1 C) 98.2 F (36.8 C) 98.2 F (36.8 C) 98.2 F (36.8 C)  TempSrc: Oral Oral Oral Oral  SpO2: 96% 98% 99% 100%  Weight:  82.6 kg    Height:        Intake/Output from previous day:  Intake/Output Summary (Last 24 hours) at 11/22/2023 1339 Last data filed at 11/22/2023 0425 Gross per 24 hour  Intake 240 ml  Output 1350 ml  Net -1110 ml    Physical Exam: Elderly female Lungs clear SEM Abdomen benign No edema Right handed  Lab Results: Basic Metabolic Panel: Recent Labs    11/22/23 0241  NA 140  K 3.5  CL 105  CO2 26  GLUCOSE 98  BUN 14  CREATININE 1.03*  CALCIUM 8.7*   Liver Function Tests: No results for input(s): AST, ALT, ALKPHOS, BILITOT, PROT, ALBUMIN in the last 72 hours. No results for input(s): LIPASE, AMYLASE in the last 72 hours. CBC: Recent Labs    11/22/23 0241  WBC 7.3  HGB 11.0*  HCT 34.5*  MCV 82.9  PLT 209   Thyroid  Function Tests: Recent Labs    11/22/23 0241  TSH 1.824   Anemia Panel: No results for input(s): VITAMINB12, FOLATE, FERRITIN, TIBC, IRON, RETICCTPCT in the last 72 hours.  Imaging: No results found.  Cardiac Studies:  ECG: grouped beating 4:3 wenkebach   Telemetry: HR 40-61 bpm periods 2:1, CHB and wenkebach  Echo: 10/09/23 EF 60-65% small posterior effusion  Medications:    amLODipine   5 mg Oral Daily   cloNIDine   0.2 mg Oral Daily   pantoprazole   40 mg Oral Daily   pravastatin   40 mg Oral Daily      Assessment/Plan:   High grade AV block:  EP to see Lopressor  wash out last took yesterday am. Holding Xarelto  no need for heparin in NSR. Likely need for PPM Timing per EP Dyspnea: CXR shows cephalization and interstitial  edema BNP elevated TTE with grade 2 diastolic dysfunction in June Start lasix  40 mg iv bid 60 mEq of Kdur today CAD: non obstructive by CTA 03/18/23 calcium score 332 ASA and statin  HTN continue norvasc  and clonidine     Tina Curry 11/22/2023, 1:39 PM

## 2023-11-22 NOTE — Consult Note (Signed)
 ELECTROPHYSIOLOGY CONSULT NOTE    Patient ID: Tina Curry MRN: [id number], DOB/AGE: [birth year] 83 y.o.  Admit date: 11/21/2023 Date of Consult: 11/22/2023  Primary Physician: Street, Lonni HERO, MD Primary Cardiologist: None  Electrophysiologist: none   Referring Provider: @ATTENDING @  Patient Profile: Tina Curry is a 83 y.o. female with a history of non-obs CAD, HTN, parox AFib who is being seen today for the evaluation of high grade AV blcok at the request of Dr. Delford.  HPI:  Tina Curry is a 83 y.o. female with PMH as above  She has felt weak with some postural symptoms for about a week. Felt worse yesterday and so presented to [facility] ER. Whle in ER, HR in 30s.  She was transferred to [facility] for further evaluation, on arrival in 2:1 block with rates 50-55. She has maintained 2:1 HB with very brief episodes of high grade AVB.   Last dose lopressor  wed AM Last dose xarelto  wed PM  Felt this way on and off, but worse the last week. Her daughter also notes taht she has gained 10lbs in the last week by home scale. Her legs feel weak with walking and she is very SOB with activity. She has a productive cough. Endorses some lower extremity edema, but has had more edema in the past. She denies chest pain, chest pressure.   Daughter and husband at bedside   Labs Potassium3.5 (08/01 0241)   Creatinine, ser  1.03* (08/01 0241) PLT  209 (08/01 0241) HGB  11.0* (08/01 0241) WBC 7.3 (08/01 0241)  .    Past Medical History:  Diagnosis Date   Allergy to alpha-gal    Aortic valve sclerosis 05/12/2021   Atrial fibrillation (HCC) 02/21/2023   Chronic cough 11/20/2021   Coronary artery disease 03/07/2023   Dyslipidemia 12/26/2016   Episodic lightheadedness 08/26/2017   Essential hypertension 07/13/2009   Qualifier: Diagnosis of   By: Lang CMA, Jessica      IMO SNOMED Dx Update Oct 2024     Goiter 12/15/2009   Qualifier: Diagnosis of   By: Jude MD, Harden ROCKFORD      IMO SNOMED Dx  Update Oct 2024     Leg pain, bilateral 01/15/2018   Malaise and fatigue 11/03/2018   Meningitis    Murmur 03/06/2017   OSA (obstructive sleep apnea) 11/08/2011   PSG 10/12 mild, 9/h     Other and unspecified hyperlipidemia    Other chest pain 05/12/2021   Palpitations 12/26/2016   Paroxysmal atrial fibrillation (HCC) 03/07/2023   Peripheral vascular disease (HCC) 12/26/2016   Persistent atrial fibrillation (HCC) 03/07/2023   Viral upper respiratory tract infection 12/26/2016     Surgical History:  Past Surgical History:  Procedure Laterality Date   BACK SURGERY  1990 and 2001   bladdr tack  03/06/2004   CATARACT EXTRACTION Left 2011   CATARACT EXTRACTION Right 2017   CHOLECYSTECTOMY  02/18/2009   EYE SURGERY     left   FOOT SURGERY Left 1992   left   TUBAL LIGATION  04/23/1972   VESICOVAGINAL FISTULA CLOSURE W/ TAH  04/23/1992   VITRECTOMY Left 2010   left eye     Medications Prior to Admission  Medication Sig Dispense Refill Last Dose/Taking   ACETAMINOPHEN  PO Take 1 tablet by mouth every 6 (six) hours as needed for mild pain (pain score 1-3) or moderate pain (pain score 4-6).   Past Week   amLODipine  (NORVASC ) 5 MG tablet Take 1 tablet (5 mg total)  by mouth daily. (Patient taking differently: Take 10 mg by mouth in the morning.) 90 tablet 3 11/20/2023   Cholecalciferol (VITAMIN D3 PO) Take 1,000 Units by mouth in the morning.   11/20/2023   cloNIDine  (CATAPRES ) 0.2 MG tablet Take 0.2 mg by mouth at bedtime.   11/20/2023   Docusate Calcium (STOOL SOFTENER PO) Take 1 tablet by mouth daily.   11/20/2023   EPINEPHrine 0.3 mg/0.3 mL IJ SOAJ injection Inject 0.3 mg into the muscle as needed for anaphylaxis.   Unknown   Ferrous Sulfate (IRON) 325 (65 Fe) MG TABS Take 1 tablet by mouth daily.   11/20/2023   furosemide  (LASIX ) 40 MG tablet TAKE 1 TABLET BY MOUTH EVERY DAY (Patient taking differently: Take 40 mg by mouth in the morning.) 90 tablet 3 11/20/2023   ipratropium (ATROVENT )  0.06 % nasal spray CAN USE TWO SPRAYS IN EACH NOSTRIL EVERY SIX HOURS TO DRY UP RUNNY NOSE. (Patient taking differently: Place 2 sprays into both nostrils as needed for rhinitis. Can use two sprays in each nostril every six hours to dry up runny nose.) 45 mL 1 Unknown   magnesium oxide (MAG-OX) 400 (240 Mg) MG tablet Take 400 mg by mouth at bedtime.   11/20/2023   metoprolol  tartrate (LOPRESSOR ) 25 MG tablet Take 2 tablets (50 mg total) by mouth 2 (two) times daily.   11/20/2023   montelukast (SINGULAIR) 10 MG tablet Take 10 mg by mouth at bedtime.   11/20/2023   nitrofurantoin (MACRODANTIN) 100 MG capsule Take 100 mg by mouth at bedtime.   11/20/2023   omeprazole (PRILOSEC) 40 MG capsule Take 40 mg by mouth in the morning.   11/20/2023   potassium chloride  SA (KLOR-CON  M) 20 MEQ tablet Take 20 mEq by mouth 2 (two) times daily.   11/20/2023   pravastatin  (PRAVACHOL ) 40 MG tablet Take 1 tablet (40 mg total) by mouth daily. (Patient taking differently: Take 40 mg by mouth at bedtime.) 90 tablet 3 11/20/2023   Probiotic Product (DAILY PROBIOTIC PO) Take 1 tablet by mouth at bedtime. Women's Probiotic   11/20/2023   rivaroxaban  (XARELTO ) 20 MG TABS tablet Take 1 tablet (20 mg total) by mouth daily with supper. 30 tablet 12 11/20/2023 at  8:00 PM   vitamin C (ASCORBIC ACID) 500 MG tablet Take 500 mg by mouth daily.   11/20/2023 Morning   Zinc Acetate 50 MG CAPS Take 1 capsule by mouth daily.   11/20/2023 Morning    Inpatient Medications:   amLODipine   5 mg Oral Daily   cloNIDine   0.2 mg Oral Daily   furosemide   40 mg Intravenous BID   pantoprazole   40 mg Oral Daily   potassium chloride   60 mEq Oral Once   pravastatin   40 mg Oral Daily    Allergies:  Allergies  Allergen Reactions   Eliquis [Apixaban] Shortness Of Breath   Losartan Swelling   Alpha-Gal    Cephalexin    Clarithromycin     REACTION: metalic taste in mouth   Iodine     REACTION: rash,itch   Levofloxacin     Multiple side effects    Other     Seafood   Povidone-Iodine     REACTION: turned red   Ramipril Cough   Tequin [Gatifloxacin] Other (See Comments)    AWFUL TASTE   Atorvastatin Itching and Other (See Comments)    Generalized weakness  Generalized weakness  Generalized weakness  Generalized weakness, Generalized weakness   Bee Venom Rash  Iodinated Contrast Media Rash   Kiwi Extract Rash    Family History  Problem Relation Age of Onset   Heart attack Mother    Heart attack Father    Stroke Father    Other Sister        Brain tumor   Other Brother        Brain tumor     Physical Exam: Vitals:   11/22/23 0009 11/22/23 0417 11/22/23 0758 11/22/23 1141  BP: (!) 123/50 (!) 120/59 (!) 127/50 (!) 151/46  Pulse: (!) 44 74 (!) 36 (!) 53  Resp: 20 19 19 19   Temp: 98.8 F (37.1 C) 98.2 F (36.8 C) 98.2 F (36.8 C) 98.2 F (36.8 C)  TempSrc: Oral Oral Oral Oral  SpO2: 96% 98% 99% 100%  Weight:  82.6 kg    Height:        GEN- NAD, A&O x 3, normal affect HEENT: Normocephalic, atraumatic Lungs- wheezing in LLL, Normal effort, productive cough Heart- Regular rate and rhythm, No M/G/R.  GI- Soft, NT, ND.  Extremities- No clubbing, cyanosis. 1-2+ bilateral edema   Radiology/Studies: No results found.  TTE 10/09/2023 - normal LVEF 60-65%, mild LVH, g2DD,   Monitor 05/2023 - sinus w 1st deg HB, rates 52-120   EKG:11/22/2023 - wenkebach (personally reviewed)  TELEMETRY: 2:1 HB, very brief high grade block, rates 40s (personally reviewed)  DEVICE HISTORY: none  Assessment/Plan: #) High grade block #) weakness #) fatigue #) hypervolemia #) HFpEF  Patient presented as transfer for OSH for 2:1 HB. She has been weak with increased fatigue for the past week. She feels fluid overloaded with increased weight, edema, cough. Last dose lopressor  > 48h ago She meets criteria for PPM, pending ability to lay flat and lab availability Tentatively PPM Monday   Appreciate gen cards assistance with  diuresis.   #) parox AFib Holding home xarelto , last dose 7/30 PM   MD to see      For questions or updates, please contact [facility] Please consult [email or web address] for contact info under     Signed, Fidelia Cathers, NP  11/22/2023, 3:56 PM

## 2023-11-22 NOTE — Plan of Care (Signed)
   Problem: Clinical Measurements: Goal: Respiratory complications will improve Outcome: Progressing

## 2023-11-23 ENCOUNTER — Encounter (HOSPITAL_COMMUNITY)

## 2023-11-23 DIAGNOSIS — I441 Atrioventricular block, second degree: Secondary | ICD-10-CM

## 2023-11-23 DIAGNOSIS — R509 Fever, unspecified: Secondary | ICD-10-CM | POA: Diagnosis not present

## 2023-11-23 HISTORY — DX: Fever, unspecified: R50.9

## 2023-11-23 LAB — URINALYSIS, ROUTINE W REFLEX MICROSCOPIC
Bilirubin Urine: NEGATIVE
Glucose, UA: NEGATIVE mg/dL
Hgb urine dipstick: NEGATIVE
Ketones, ur: NEGATIVE mg/dL
Leukocytes,Ua: NEGATIVE
Nitrite: NEGATIVE
Protein, ur: NEGATIVE mg/dL
Specific Gravity, Urine: 1.009 (ref 1.005–1.030)
pH: 6 (ref 5.0–8.0)

## 2023-11-23 LAB — RESP PANEL BY RT-PCR (RSV, FLU A&B, COVID)  RVPGX2
Influenza A by PCR: NEGATIVE
Influenza B by PCR: NEGATIVE
Resp Syncytial Virus by PCR: NEGATIVE
SARS Coronavirus 2 by RT PCR: NEGATIVE

## 2023-11-23 LAB — C-REACTIVE PROTEIN: CRP: 6.5 mg/dL — ABNORMAL HIGH (ref <1.0)

## 2023-11-23 LAB — PROCALCITONIN: Procalcitonin: <0.1 ng/mL

## 2023-11-23 LAB — D-DIMER, QUANTITATIVE: D-Dimer, Quant: 1.36 ug{FEU}/mL — ABNORMAL HIGH (ref 0.00–0.50)

## 2023-11-23 NOTE — Plan of Care (Signed)

## 2023-11-23 NOTE — Plan of Care (Signed)
   Problem: Education: Goal: Knowledge of General Education information will improve Description Including pain rating scale, medication(s)/side effects and non-pharmacologic comfort measures Outcome: Progressing

## 2023-11-23 NOTE — Progress Notes (Signed)
  Progress Note  Patient Name: Tina Curry Date of Encounter: 11/23/2023 [facility] Cardiologist: None   Interval Summary   Febrile overnight. Patient reports feeling relatively well. No new or acute complaints.   Vital Signs Vitals:   11/22/23 2311 11/23/23 0318 11/23/23 0355 11/23/23 0805  BP: (!) 151/58 (!) 152/68  (!) 146/67  Pulse: 98 98  98  Resp: 20 20  20   Temp: 98.2 F (36.8 C) 98.5 F (36.9 C)  98.4 F (36.9 C)  TempSrc: Oral Oral  Oral  SpO2: 96% 98%  99%  Weight:   80 kg   Height:        Intake/Output Summary (Last 24 hours) at 11/23/2023 1250 Last data filed at 11/23/2023 1102 Gross per 24 hour  Intake 240 ml  Output 2000 ml  Net -1760 ml      11/23/2023    3:55 AM 11/22/2023    4:17 AM 11/21/2023    5:47 PM  Last 3 Weights  Weight (lbs) 176 lb 5.9 oz 182 lb 1.6 oz 181 lb 10.5 oz  Weight (kg) 80 kg 82.6 kg 82.4 kg      Telemetry/ECG  SR mobitz I - Personally Reviewed  Physical Exam  General: Well developed, in no acute distress.  Neck: No JVD.  Cardiac: Normal rate, regular rhythm.  Resp: Normal work of breathing.  Ext: No edema.  Neuro: No gross focal deficits.  Psych: Normal affect.   Assessment  Tina Curry is an 83 year old woman who I am seeing today for an evaluation of coronary disease, hypertension, paroxysmal atrial fibrillation and high grade AV block.   #AV block #High degree AV block #Symptomatic bradycardia -The patient presented to the hospital with symptomatic bradycardia secondary to AV block. I would like her to be monitored while allowing her metoprolol  to fully washout. She is currently not a candidate for permanent pacing given fever, inability to lay flat, volume overload.   #Paroxysmal atrial fibrillation -Hold Xarelto  while awaiting PPM  #Recurrent fever - Consult medicine team for assistance. We will need fevers resolved prior to any device implant. Blood culture, UA and CXR w/o infection. Will scan lower extremities  for DVTs.   For questions or updates, please contact [facility] Please consult [email or web address] for contact info under       Signed, Fonda Kitty, MD

## 2023-11-23 NOTE — Consult Note (Addendum)
 Initial Consultation Note   Patient: Tina Curry FMW:[id number] DOB: July 17, 1940 PCP: Rusty Lonni HERO, MD DOA: 11/21/2023 DOS: the patient was seen and examined on 11/23/2023 Primary service: Okey Vina GAILS, MD  Referring physician:  Dr.Parker Reason for consult:   fever    Assessment and Plan:  Fever Patient noted to have low-grade fever elevated up to 100.6 F.  No prior fevers last night.  Urinalysis showed no signs of infection.  Chest x-ray noted bibasilar atelectasis with small bilateral pleural effusions. Doppler ultrasound of the lower extremities have been ordered due to leg pain.  Evaluation of the legs did not reveal any signs for cellulitis.  Suspect fevers could possibly be coming from atelectasis with other possibilities for low-grade fever including possibility of pneumonia/viral infection since being in the hospital and blood clot since being off anticoagulation  - Check for influenza, COVID-19, and RSV - Check D-dimer, CRP, procalcitonin, and repeat CBC with differential - Continue to follow blood cultures  - Incentive spirometry every 4 hours.  Advised patient to take deep inspiratory breaths as she is at risk for developing pneumonia from laying in the bed   AV block Symptomatic bradycardia -Per cardiology  Paroxysmal atrial fibrillation - Currently holding Xarelto  last dose on 7/30    TRH will continue to follow the patient.  HPI: [name] is a 83 y.o. female with past medical history of hypertension, PAF on Xarelto , nonobstructive CAD who was sent from [facility] and accepted to the cardiology service for high-grade heart block on 7/31.  Patient had been on Xarelto  and Lopressor  50 mg twice daily with last doses noted to be on 7/30.  Heart rates in the ED noted to be 50-55 with elevated blood pressures into the 160s.   She experienced a fever of 100.78F last night, which was the first occurrence. Subsequent temperature readings were 100.672F and then 98.78F today.  She has been hospitalized since July 31st and has been  bedridden, although she has been having the head of the bed elevated occasionally.  She has been receiving Lasix  and potassium supplementation. She experienced pain in her left foot, heel, and knee, but no redness was observed.  She has a history of recurrent bladder infections since 2005.  Urinalysis obtained today did not show any signs for infection.  No stomach pain or open wounds or redness.  She mentions difficulty breathing and coughing with sputum production, which has made it hard to breathe.   Review of Systems: As mentioned in the history of present illness. All other systems reviewed and are negative. Past Medical History:  Diagnosis Date   Allergy to alpha-gal    Aortic valve sclerosis 05/12/2021   Atrial fibrillation (HCC) 02/21/2023   Chronic cough 11/20/2021   Coronary artery disease 03/07/2023   Dyslipidemia 12/26/2016   Episodic lightheadedness 08/26/2017   Essential hypertension 07/13/2009   Qualifier: Diagnosis of   By: Lang CMA, Harlene SCULL SNOMED Dx Update Oct 2024     Goiter 12/15/2009   Qualifier: Diagnosis of   By: Jude MD, Harden ROCKFORD      IMO SNOMED Dx Update Oct 2024     Leg pain, bilateral 01/15/2018   Malaise and fatigue 11/03/2018   Meningitis    Murmur 03/06/2017   OSA (obstructive sleep apnea) 11/08/2011   PSG 10/12 mild, 9/h     Other and unspecified hyperlipidemia    Other chest pain 05/12/2021   Palpitations 12/26/2016   Paroxysmal atrial fibrillation (  HCC) 03/07/2023   Peripheral vascular disease (HCC) 12/26/2016   Persistent atrial fibrillation (HCC) 03/07/2023   Viral upper respiratory tract infection 12/26/2016   Past Surgical History:  Procedure Laterality Date   BACK SURGERY  1990 and 2001   bladdr tack  03/06/2004   CATARACT EXTRACTION Left 2011   CATARACT EXTRACTION Right 2017   CHOLECYSTECTOMY  02/18/2009   EYE SURGERY     left   FOOT SURGERY Left 1992   left    TUBAL LIGATION  04/23/1972   VESICOVAGINAL FISTULA CLOSURE W/ TAH  04/23/1992   VITRECTOMY Left 2010   left eye   Social History:  reports that she has never smoked. She has been exposed to tobacco smoke. She has quit using smokeless tobacco.  Her smokeless tobacco use included snuff. She reports that she does not drink alcohol and does not use drugs.  Allergies  Allergen Reactions   Eliquis [Apixaban] Shortness Of Breath   Losartan Swelling   Alpha-Gal    Cephalexin    Clarithromycin     REACTION: metalic taste in mouth   Iodine     REACTION: rash,itch   Levofloxacin     Multiple side effects   Other     Seafood   Povidone-Iodine     REACTION: turned red   Ramipril Cough   Tequin [Gatifloxacin] Other (See Comments)    AWFUL TASTE   Atorvastatin Itching and Other (See Comments)    Generalized weakness  Generalized weakness  Generalized weakness  Generalized weakness, Generalized weakness   Bee Venom Rash   Iodinated Contrast Media Rash   Kiwi Extract Rash    Family History  Problem Relation Age of Onset   Heart attack Mother    Heart attack Father    Stroke Father    Other Sister        Brain tumor   Other Brother        Brain tumor    Prior to Admission medications   Medication Sig Start Date End Date Taking? Authorizing Provider  ACETAMINOPHEN  PO Take 1 tablet by mouth every 6 (six) hours as needed for mild pain (pain score 1-3) or moderate pain (pain score 4-6).   Yes [provider]  amLODipine  (NORVASC ) 5 MG tablet Take 1 tablet (5 mg total) by mouth daily. Patient taking differently: Take 10 mg by mouth in the morning. 07/16/23 11/21/23 Yes Revankar, Rajan R, MD  Cholecalciferol (VITAMIN D3 PO) Take 1,000 Units by mouth in the morning.   Yes [provider]  cloNIDine  (CATAPRES ) 0.2 MG tablet Take 0.2 mg by mouth at bedtime. 12/15/22  Yes [provider]  Docusate Calcium (STOOL SOFTENER PO) Take 1 tablet by mouth daily.   Yes  [provider]  EPINEPHrine 0.3 mg/0.3 mL IJ SOAJ injection Inject 0.3 mg into the muscle as needed for anaphylaxis. 02/13/23  Yes [provider]  Ferrous Sulfate (IRON) 325 (65 Fe) MG TABS Take 1 tablet by mouth daily.   Yes [provider]  furosemide  (LASIX ) 40 MG tablet TAKE 1 TABLET BY MOUTH EVERY DAY Patient taking differently: Take 40 mg by mouth in the morning. 10/21/23  Yes Krasowski, Robert J, MD  ipratropium (ATROVENT ) 0.06 % nasal spray CAN USE TWO SPRAYS IN EACH NOSTRIL EVERY SIX HOURS TO DRY UP RUNNY NOSE. Patient taking differently: Place 2 sprays into both nostrils as needed for rhinitis. Can use two sprays in each nostril every six hours to dry up runny nose.  10/21/23  Yes Kozlow, Eric J, MD  magnesium oxide (MAG-OX) 400 (240 Mg) MG tablet Take 400 mg by mouth at bedtime.   Yes [provider]  metoprolol  tartrate (LOPRESSOR ) 25 MG tablet Take 2 tablets (50 mg total) by mouth 2 (two) times daily. 05/29/23  Yes Madireddy, Alean SAUNDERS, MD  montelukast (SINGULAIR) 10 MG tablet Take 10 mg by mouth at bedtime. 12/15/22  Yes [provider]  nitrofurantoin (MACRODANTIN) 100 MG capsule Take 100 mg by mouth at bedtime.   Yes [provider]  omeprazole (PRILOSEC) 40 MG capsule Take 40 mg by mouth in the morning.   Yes [provider]  potassium chloride  SA (KLOR-CON  M) 20 MEQ tablet Take 20 mEq by mouth 2 (two) times daily. 07/04/23  Yes [provider]  pravastatin  (PRAVACHOL ) 40 MG tablet Take 1 tablet (40 mg total) by mouth daily. Patient taking differently: Take 40 mg by mouth at bedtime. 03/19/23  Yes Revankar, Rajan R, MD  Probiotic Product (DAILY PROBIOTIC PO) Take 1 tablet by mouth at bedtime. Women's Probiotic   Yes [provider]  rivaroxaban  (XARELTO ) 20 MG TABS tablet Take 1 tablet (20 mg total) by mouth daily with supper. 03/01/23  Yes Revankar, Jennifer SAUNDERS, MD  vitamin C (ASCORBIC ACID) 500 MG tablet Take  500 mg by mouth daily.   Yes [provider]  Zinc Acetate 50 MG CAPS Take 1 capsule by mouth daily.   Yes [provider]    Physical Exam: Vitals:   11/23/23 0318 11/23/23 0355 11/23/23 0805 11/23/23 1335  BP: (!) 152/68  (!) 146/67 (!) 150/56  Pulse: 98  98 76  Resp: 20  20 20   Temp: 98.5 F (36.9 C)  98.4 F (36.9 C) 98.4 F (36.9 C)  TempSrc: Oral  Oral Oral  SpO2: 98%  99% 98%  Weight:  80 kg    Height:       Constitutional: Elderly female currently in no acute distress and able to follow commands Eyes: PERRL, lids and conjunctivae normal ENMT: Mucous membranes are moist.  Normal dentition.  Neck: normal, supple.  No JVD Respiratory: Normal respiratory effort with some crackles heard in the lower lung fields without significant wheezes appreciated.  Patient on 2 L of nasal cannula oxygen  with O2 saturation maintained Cardiovascular: Regular rate and rhythm, no murmurs / rubs / gallops. No extremity edema.   Abdomen: no tenderness, no masses palpated. No hepatosplenomegaly. Bowel sounds positive.  Musculoskeletal: no clubbing / cyanosis. No joint deformity upper and lower extremities. Good ROM, no contractures. Normal muscle tone.  Skin: Varicose veins of the lower extremities without redness or erythema appreciated Neurologic: CN 2-12 grossly intact.   Strength 5/5 in all 4.  Psychiatric: Normal judgment and insight. Alert and oriented x 3. Normal mood.   Data Reviewed:   Reviewed labs, imaging, pertinent records as documented.   Family Communication: Family updated at bedside Primary team communication:  Thank you very much for involving us  in the care of your patient.  Author: Maximino DELENA Sharps, MD 11/23/2023 4:35 PM  For on call review [email or web address].

## 2023-11-24 ENCOUNTER — Inpatient Hospital Stay (HOSPITAL_COMMUNITY)

## 2023-11-24 DIAGNOSIS — M7989 Other specified soft tissue disorders: Secondary | ICD-10-CM

## 2023-11-24 DIAGNOSIS — I441 Atrioventricular block, second degree: Secondary | ICD-10-CM | POA: Diagnosis not present

## 2023-11-24 MED ORDER — DIPHENHYDRAMINE HCL 25 MG PO CAPS
25.0000 mg | ORAL_CAPSULE | Freq: Three times a day (TID) | ORAL | Status: DC | PRN
  Administered 2023-11-24: 25 mg via ORAL
  Filled 2023-11-24: qty 1

## 2023-11-24 NOTE — Progress Notes (Signed)
 Left lower extremity venous duplex has been completed. Preliminary results can be found in CV Proc through chart review.   11/24/23 12:13 PM Cathlyn Collet RVT

## 2023-11-24 NOTE — Progress Notes (Signed)
 PROGRESS NOTE    Tina Curry  FMW:[id number] DOB: 1940/09/06 DOA: 11/21/2023 PCP: Rusty, Lonni HERO, MD      Assessment & Plan:  Principal Problem:   2nd degree AV block Active Problems:   Fever   Fever: No further episodes of fever Chest x-ray noted bibasilar atelectasis with small bilateral pleural effusions.  - Negative for influenza, COVID-19, and RSV - D-dimer is slightly elevated, CRP is 6.5, procalcitonin (normal) -No growth on blood cultures -No evidence of UTI -No evidence of an active infection. No need for antibiotic initiation. - Incentive spirometry every 4 hours.   -Venous duplex LE done, pending report.     High degree AV block Symptomatic bradycardia -Per cardiology -Needs PM eventually   Paroxysmal atrial fibrillation - Holding Xarelto  last dose on 7/30     DVT prophylaxis: SCDs Start: 11/21/23 1750     Code Status: Full Code Family Communication:   Status is: Inpatient Remains inpatient appropriate because: heart block    Subjective:  No acute events overnight. She has been afebrile.   Examination:  General exam: Appears calm and comfortable  Respiratory system: Clear to auscultation. Respiratory effort normal. Cardiovascular system: S1 & S2 heard, RRR. No JVD, murmurs, rubs, gallops or clicks. No pedal edema. Gastrointestinal system: Abdomen is nondistended, soft and nontender. No organomegaly or masses felt. Normal bowel sounds heard. Central nervous system: Alert and oriented. No focal neurological deficits. Extremities: Symmetric 5 x 5 power. Skin: No rashes, lesions or ulcers Psychiatry: Judgement and insight appear normal. Mood & affect appropriate.       Diet Orders (From admission, onward)     Start     Ordered   11/21/23 1752  Diet Heart Room service appropriate? Yes; Fluid consistency: Thin  Diet effective now       Question Answer Comment  Room service appropriate? Yes   Fluid consistency: Thin      11/21/23 1751             Objective: Vitals:   11/24/23 0037 11/24/23 0300 11/24/23 0506 11/24/23 1205  BP: (!) 149/96 (!) 142/62  (!) 105/58  Pulse: (!) 115 72  69  Resp: 18 20  19   Temp: 98.4 F (36.9 C) 98.2 F (36.8 C)  98.2 F (36.8 C)  TempSrc: Oral Oral  Oral  SpO2: 99% 100%  97%  Weight:   78.8 kg   Height:        Intake/Output Summary (Last 24 hours) at 11/24/2023 1217 Last data filed at 11/24/2023 0048 Gross per 24 hour  Intake 240 ml  Output 1200 ml  Net -960 ml   Filed Weights   11/22/23 0417 11/23/23 0355 11/24/23 0506  Weight: 82.6 kg 80 kg 78.8 kg    Scheduled Meds:  amLODipine   5 mg Oral Daily   cloNIDine   0.2 mg Oral Daily   furosemide   40 mg Intravenous BID   pantoprazole   40 mg Oral Daily   pravastatin   40 mg Oral Daily   Continuous Infusions:  Nutritional status     Body mass index is 28.91 kg/m.  Data Reviewed:   CBC: Recent Labs  Lab 11/22/23 0241  WBC 7.3  HGB 11.0*  HCT 34.5*  MCV 82.9  PLT 209   Basic Metabolic Panel: Recent Labs  Lab 11/22/23 0241  NA 140  K 3.5  CL 105  CO2 26  GLUCOSE 98  BUN 14  CREATININE 1.03*  CALCIUM 8.7*   GFR: Estimated Creatinine Clearance: 42.9  mL/min (A) (by C-G formula based on SCr of 1.03 mg/dL (H)). Liver Function Tests: No results for input(s): AST, ALT, ALKPHOS, BILITOT, PROT, ALBUMIN in the last 168 hours. No results for input(s): LIPASE, AMYLASE in the last 168 hours. No results for input(s): AMMONIA in the last 168 hours. Coagulation Profile: No results for input(s): INR, PROTIME in the last 168 hours. Cardiac Enzymes: No results for input(s): CKTOTAL, CKMB, CKMBINDEX, TROPONINI in the last 168 hours. BNP (last 3 results) Recent Labs    09/12/23 1530  PROBNP 969*   HbA1C: No results for input(s): HGBA1C in the last 72 hours. CBG: No results for input(s): GLUCAP in the last 168 hours. Lipid Profile: No results for input(s): CHOL, HDL,  LDLCALC, TRIG, CHOLHDL, LDLDIRECT in the last 72 hours. Thyroid  Function Tests: Recent Labs    11/22/23 0241  TSH 1.824   Anemia Panel: No results for input(s): VITAMINB12, FOLATE, FERRITIN, TIBC, IRON, RETICCTPCT in the last 72 hours. Sepsis Labs: Recent Labs  Lab 11/23/23 1639  PROCALCITON <0.10    Recent Results (from the past 240 hours)  Culture, blood (Routine X 2) w Reflex to ID Panel     Status: None (Preliminary result)   Collection Time: 11/22/23 11:35 PM   Specimen: BLOOD  Result Value Ref Range Status   Specimen Description BLOOD LEFT ANTECUBITAL  Final   Special Requests   Final    AEROBIC BOTTLE ONLY Blood Culture results may not be optimal due to an inadequate volume of blood received in culture bottles   Culture   Final    NO GROWTH < 12 HOURS Performed at [facility], [address], [place], [place] [address]    Report Status PENDING  Incomplete  Culture, blood (Routine X 2) w Reflex to ID Panel     Status: None (Preliminary result)   Collection Time: 11/22/23 11:35 PM   Specimen: BLOOD RIGHT HAND  Result Value Ref Range Status   Specimen Description BLOOD RIGHT HAND  Final   Special Requests   Final    BOTTLES DRAWN AEROBIC AND ANAEROBIC Blood Culture results may not be optimal due to an inadequate volume of blood received in culture bottles   Culture   Final    NO GROWTH < 12 HOURS Performed at [facility], [address], [place], [place] [address]    Report Status PENDING  Incomplete  Resp panel by RT-PCR (RSV, Flu A&B, Covid) Anterior Nasal Swab     Status: None   Collection Time: 11/23/23  6:53 PM   Specimen: Anterior Nasal Swab  Result Value Ref Range Status   SARS Coronavirus 2 by RT PCR NEGATIVE NEGATIVE Final   Influenza A by PCR NEGATIVE NEGATIVE Final   Influenza B by PCR NEGATIVE NEGATIVE Final    Comment: (NOTE) The Xpert Xpress SARS-CoV-2/FLU/RSV plus assay is intended as an aid in the diagnosis of  influenza from Nasopharyngeal swab specimens and should not be used as a sole basis for treatment. Nasal washings and aspirates are unacceptable for Xpert Xpress SARS-CoV-2/FLU/RSV testing.  Fact Sheet for Patients: BloggerCourse.com  Fact Sheet for Healthcare Providers: SeriousBroker.it  This test is not yet approved or cleared by the [place]  FDA and has been authorized for detection and/or diagnosis of SARS-CoV-2 by FDA under an Emergency Use Authorization (EUA). This EUA will remain in effect (meaning this test can be used) for the duration of the COVID-19 declaration under Section 564(b)(1) of the Act, 21  U.S.C. section 360bbb-3(b)(1), unless the authorization is terminated or revoked.     Resp Syncytial Virus by PCR NEGATIVE NEGATIVE Final    Comment: (NOTE) Fact Sheet for Patients: BloggerCourse.com  Fact Sheet for Healthcare Providers: SeriousBroker.it  This test is not yet approved or cleared by the [place]  FDA and has been authorized for detection and/or diagnosis of SARS-CoV-2 by FDA under an Emergency Use Authorization (EUA). This EUA will remain in effect (meaning this test can be used) for the duration of the COVID-19 declaration under Section 564(b)(1) of the Act, 21 U.S.C. section 360bbb-3(b)(1), unless the authorization is terminated or revoked.  Performed at [facility], [address] [place], [place] [address]          Radiology Studies: DG CHEST PORT 1 VIEW Result Date: 11/22/2023 CLINICAL DATA:  Dyspnea, chronic cough. EXAM: PORTABLE CHEST 1 VIEW COMPARISON:  November 21, 2023. FINDINGS: Stable cardiomegaly. Interval development of mild bibasilar subsegmental atelectasis and small pleural effusions. Bony thorax is unremarkable. IMPRESSION: Interval development of mild bibasilar subsegmental atelectasis and small pleural effusions.  Electronically Signed   By: Lynwood Landy Raddle M.D.   On: 11/22/2023 16:08        LOS: 3 days   Time spent= 35 mins    Deliliah Room, MD [facility]  If 7PM-7AM, please contact night-coverage  11/24/2023, 12:17 PM

## 2023-11-24 NOTE — Progress Notes (Signed)
  Progress Note  Patient Name: Tina Curry Date of Encounter: 11/24/2023 [facility] Cardiologist: None   Interval Summary   No acute overnight events. Afebrile in past 24 hours. Patient reports feeling relatively well. No new or acute complaints.    Vital Signs Vitals:   11/24/23 0037 11/24/23 0300 11/24/23 0506 11/24/23 1205  BP: (!) 149/96 (!) 142/62  (!) 105/58  Pulse: (!) 115 72  69  Resp: 18 20  19   Temp: 98.4 F (36.9 C) 98.2 F (36.8 C)  98.2 F (36.8 C)  TempSrc: Oral Oral  Oral  SpO2: 99% 100%  97%  Weight:   78.8 kg   Height:        Intake/Output Summary (Last 24 hours) at 11/24/2023 1306 Last data filed at 11/24/2023 0048 Gross per 24 hour  Intake --  Output 1200 ml  Net -1200 ml      11/24/2023    5:06 AM 11/23/2023    3:55 AM 11/22/2023    4:17 AM  Last 3 Weights  Weight (lbs) 173 lb 11.6 oz 176 lb 5.9 oz 182 lb 1.6 oz  Weight (kg) 78.8 kg 80 kg 82.6 kg      Telemetry/ECG  SR mobitz I - Personally Reviewed  Physical Exam  General: Well developed, in no acute distress.  Neck: No JVD.  Cardiac: Normal rate, regular rhythm.  Resp: Normal work of breathing.  Ext: No edema.  Neuro: No gross focal deficits.  Psych: Normal affect.   Assessment  Tina Curry is an 83 year old woman who I am seeing today for an evaluation of coronary disease, hypertension, paroxysmal atrial fibrillation and high grade AV block.   #AV block #High degree AV block #Symptomatic bradycardia -Metoprolol  washed out. Still with bradycardia and intermittent AV block. Plan for pacemaker early this week. NPO after midnight in case we can accommodate, no guarantee. Needs to remain afebrile.  #Paroxysmal atrial fibrillation #Tachycardia-bradycardia syndrome -Hold Xarelto  while awaiting PPM  #Recurrent fever - Consulted medicine, appreciate assistance. Infectious workup unremarkable thus far. LE dopplers pending.  For questions or updates, please contact [facility] Please consult [email or web address] for contact info under       Signed, Fonda Kitty, MD

## 2023-11-24 NOTE — Plan of Care (Signed)

## 2023-11-25 DIAGNOSIS — I441 Atrioventricular block, second degree: Secondary | ICD-10-CM | POA: Diagnosis not present

## 2023-11-25 NOTE — Progress Notes (Signed)
  Patient Name: Tina Curry Date of Encounter: 11/25/2023  Primary Cardiologist: None Electrophysiologist: None  Interval Summary   The patient is doing well today.  At this time, the patient denies chest pain, shortness of breath, or any new concerns.  Vital Signs    Vitals:   11/24/23 1619 11/24/23 1932 11/24/23 2347 11/25/23 0339  BP: 116/63 133/61 (!) 120/46 (!) 143/73  Pulse:  62 84 95  Resp: 18 17 18 16   Temp: 98.6 F (37 C) 98.8 F (37.1 C) 98.8 F (37.1 C) 98.1 F (36.7 C)  TempSrc: Oral Oral Oral Oral  SpO2: 99% 97% 98% 98%  Weight:    79 kg  Height:        Intake/Output Summary (Last 24 hours) at 11/25/2023 0757 Last data filed at 11/25/2023 0300 Gross per 24 hour  Intake --  Output 900 ml  Net -900 ml   Filed Weights   11/23/23 0355 11/24/23 0506 11/25/23 0339  Weight: 80 kg 78.8 kg 79 kg    Physical Exam    GEN- NAD, Alert and oriented  Lungs- Clear to ausculation bilaterally, normal work of breathing Cardiac- Regular rate and rhythm, no murmurs, rubs or gallops GI- soft, NT, ND, + BS Extremities- no clubbing or cyanosis. No edema  Telemetry    NSR and sinus tach with intermittent 2:1 AV block as well as wenckebach (personally reviewed)  Hospital Course    Jacelynn Hayton is a 83 y.o. female with h/o coronary disease, hypertension, paroxysmal atrial fibrillation and high grade AV block being seen for PPM consideration  Assessment & Plan    AV block High degree AV block Symptomatic bradycardia Despite BB washout.  Explained risks, benefits, and alternatives to PPM implantation, including but not limited to bleeding, infection, pneumothorax, pericardial effusion, lead dislodgement, heart attack, stroke, or death.  Pt verbalized understanding and agrees to proceed.    OAC held  Continue clear liquids  Low grade fever No clear infectious source thus far. > 48 hrs afebrile.  PPM timing pending lab availability.   For questions or updates, please  contact [facility] Please consult [email or web address] for contact info under     Signed, Ozell Prentice Passey, PA-C  11/25/2023, 7:57 AM

## 2023-11-25 NOTE — Progress Notes (Addendum)
 PROGRESS NOTE    Tina Curry  FMW:[id number] DOB: June 05, 1940 DOA: 11/21/2023 PCP: Rusty, Lonni HERO, MD      Assessment & Plan:  Principal Problem:   2nd degree AV block Active Problems:   Fever   Fever: No further episodes of fever Chest x-ray noted bibasilar atelectasis with small bilateral pleural effusions.  - Negative for influenza, COVID-19, and RSV - D-dimer is slightly elevated, CRP is 6.5, procalcitonin (normal) -No growth on blood cultures -No evidence of UTI -No evidence of an active infection. No need for antibiotic initiation. - Incentive spirometry every 4 hours.   -Venous duplex LE done and showed no clot -Consider resuming nitrofurantoin that she is chronically on at home for UTI Prophylaxis.     High degree AV block Symptomatic bradycardia -Per cardiology -Needs PM eventually   Paroxysmal atrial fibrillation - Holding Xarelto  last dose on 7/30  Thank you for the consultation. TRH will sign off at this time. Please call with any questions.      DVT prophylaxis: SCDs Start: 11/21/23 1750     Code Status: Full Code Family Communication:  Husband and daughter at the bedside Status is: Inpatient Remains inpatient appropriate because: heart block, needs PPM     Subjective:  No acute events overnight. She has been afebrile for almost 48 hours now. I told her that US  LE didn't show any blood clots and she doesn't have any evidence of active infection. She told me she gets frequency UTIs and follows up with [place] Ste[place] at [place] who has her on long term nitrofurantoin.She is hoping to have PPM placed today. She is on clears. Denies chest pain or shortness.   Examination:  General exam: Appears calm and comfortable  Respiratory system: Clear to auscultation. Respiratory effort normal. Cardiovascular system: S1 & S2 heard, RRR. No JVD, murmurs, rubs, gallops or clicks. No pedal edema. Gastrointestinal system: Abdomen is nondistended, soft and  nontender. No organomegaly or masses felt. Normal bowel sounds heard. Central nervous system: Alert and oriented. No focal neurological deficits. Extremities: Symmetric 5 x 5 power. Skin: No rashes, lesions or ulcers Psychiatry: Judgement and insight appear normal. Mood & affect appropriate.       Diet Orders (From admission, onward)     Start     Ordered   11/25/23 0648  Diet clear liquid Room service appropriate? Yes; Fluid consistency: Thin  Diet effective now       Question Answer Comment  Room service appropriate? Yes   Fluid consistency: Thin      11/25/23 0648            Objective: Vitals:   11/24/23 2347 11/25/23 0339 11/25/23 0714 11/25/23 0826  BP: (!) 120/46 (!) 143/73 138/70 124/83  Pulse: 84 95 (!) 57 95  Resp: 18 16 14 18   Temp: 98.8 F (37.1 C) 98.1 F (36.7 C) 99.1 F (37.3 C) 98.1 F (36.7 C)  TempSrc: Oral Oral Oral Oral  SpO2: 98% 98% 97% 100%  Weight:  79 kg    Height:        Intake/Output Summary (Last 24 hours) at 11/25/2023 0932 Last data filed at 11/25/2023 0300 Gross per 24 hour  Intake --  Output 900 ml  Net -900 ml   Filed Weights   11/23/23 0355 11/24/23 0506 11/25/23 0339  Weight: 80 kg 78.8 kg 79 kg    Scheduled Meds:  amLODipine   5 mg Oral Daily   cloNIDine   0.2 mg Oral Daily  furosemide   40 mg Intravenous BID   pantoprazole   40 mg Oral Daily   pravastatin   40 mg Oral Daily   Continuous Infusions:  Nutritional status     Body mass index is 28.98 kg/m.  Data Reviewed:   CBC: Recent Labs  Lab 11/22/23 0241  WBC 7.3  HGB 11.0*  HCT 34.5*  MCV 82.9  PLT 209   Basic Metabolic Panel: Recent Labs  Lab 11/22/23 0241  NA 140  K 3.5  CL 105  CO2 26  GLUCOSE 98  BUN 14  CREATININE 1.03*  CALCIUM 8.7*   GFR: Estimated Creatinine Clearance: 43 mL/min (A) (by C-G formula based on SCr of 1.03 mg/dL (H)). Liver Function Tests: No results for input(s): AST, ALT, ALKPHOS, BILITOT, PROT, ALBUMIN in  the last 168 hours. No results for input(s): LIPASE, AMYLASE in the last 168 hours. No results for input(s): AMMONIA in the last 168 hours. Coagulation Profile: No results for input(s): INR, PROTIME in the last 168 hours. Cardiac Enzymes: No results for input(s): CKTOTAL, CKMB, CKMBINDEX, TROPONINI in the last 168 hours. BNP (last 3 results) Recent Labs    09/12/23 1530  PROBNP 969*   HbA1C: No results for input(s): HGBA1C in the last 72 hours. CBG: No results for input(s): GLUCAP in the last 168 hours. Lipid Profile: No results for input(s): CHOL, HDL, LDLCALC, TRIG, CHOLHDL, LDLDIRECT in the last 72 hours. Thyroid  Function Tests: No results for input(s): TSH, T4TOTAL, FREET4, T3FREE, THYROIDAB in the last 72 hours.  Anemia Panel: No results for input(s): VITAMINB12, FOLATE, FERRITIN, TIBC, IRON, RETICCTPCT in the last 72 hours. Sepsis Labs: Recent Labs  Lab 11/23/23 1639  PROCALCITON <0.10    Recent Results (from the past 240 hours)  Culture, blood (Routine X 2) w Reflex to ID Panel     Status: None (Preliminary result)   Collection Time: 11/22/23 11:35 PM   Specimen: BLOOD  Result Value Ref Range Status   Specimen Description BLOOD LEFT ANTECUBITAL  Final   Special Requests   Final    AEROBIC BOTTLE ONLY Blood Culture results may not be optimal due to an inadequate volume of blood received in culture bottles   Culture   Final    NO GROWTH 2 DAYS Performed at [facility], [address], [place], [place] [address]    Report Status PENDING  Incomplete  Culture, blood (Routine X 2) w Reflex to ID Panel     Status: None (Preliminary result)   Collection Time: 11/22/23 11:35 PM   Specimen: BLOOD RIGHT HAND  Result Value Ref Range Status   Specimen Description BLOOD RIGHT HAND  Final   Special Requests   Final    BOTTLES DRAWN AEROBIC AND ANAEROBIC Blood Culture results may not be optimal due to an  inadequate volume of blood received in culture bottles   Culture   Final    NO GROWTH 2 DAYS Performed at [facility], [address] [place], [place] [address]    Report Status PENDING  Incomplete  Resp panel by RT-PCR (RSV, Flu A&B, Covid) Anterior Nasal Swab     Status: None   Collection Time: 11/23/23  6:53 PM   Specimen: Anterior Nasal Swab  Result Value Ref Range Status   SARS Coronavirus 2 by RT PCR NEGATIVE NEGATIVE Final   Influenza A by PCR NEGATIVE NEGATIVE Final   Influenza B by PCR NEGATIVE NEGATIVE Final    Comment: (NOTE) The Xpert Xpress SARS-CoV-2/FLU/RSV  plus assay is intended as an aid in the diagnosis of influenza from Nasopharyngeal swab specimens and should not be used as a sole basis for treatment. Nasal washings and aspirates are unacceptable for Xpert Xpress SARS-CoV-2/FLU/RSV testing.  Fact Sheet for Patients: BloggerCourse.com  Fact Sheet for Healthcare Providers: SeriousBroker.it  This test is not yet approved or cleared by the [place]  FDA and has been authorized for detection and/or diagnosis of SARS-CoV-2 by FDA under an Emergency Use Authorization (EUA). This EUA will remain in effect (meaning this test can be used) for the duration of the COVID-19 declaration under Section 564(b)(1) of the Act, 21 U.S.C. section 360bbb-3(b)(1), unless the authorization is terminated or revoked.     Resp Syncytial Virus by PCR NEGATIVE NEGATIVE Final    Comment: (NOTE) Fact Sheet for Patients: BloggerCourse.com  Fact Sheet for Healthcare Providers: SeriousBroker.it  This test is not yet approved or cleared by the [place]  FDA and has been authorized for detection and/or diagnosis of SARS-CoV-2 by FDA under an Emergency Use Authorization (EUA). This EUA will remain in effect (meaning this test can be used) for the duration of the COVID-19  declaration under Section 564(b)(1) of the Act, 21 U.S.C. section 360bbb-3(b)(1), unless the authorization is terminated or revoked.  Performed at [facility], [address], [place], [place] [address]          Radiology Studies: VAS US  LOWER EXTREMITY VENOUS (DVT) Result Date: 11/24/2023  Lower Venous DVT Study Patient Name:  Princella Blassingame  Date of Exam:   11/24/2023 Medical Rec #: [id number]  Accession #:    [id number] Date of Birth: November 12, 1940   Patient Gender: F Patient Age:   82 years Exam Location:  [facility] Procedure:      VAS US  LOWER EXTREMITY VENOUS (DVT) Referring Phys: GRAYCE BOLD --------------------------------------------------------------------------------  Indications: Swelling.  Risk Factors: None identified. Comparison Study: No prior studies. Performing Technologist: Cordella Collet RVT  Examination Guidelines: A complete evaluation includes B-mode imaging, spectral Doppler, color Doppler, and power Doppler as needed of all accessible portions of each vessel. Bilateral testing is considered an integral part of a complete examination. Limited examinations for reoccurring indications may be performed as noted. The reflux portion of the exam is performed with the patient in reverse Trendelenburg.  +-----+---------------+---------+-----------+----------+--------------+ RIGHTCompressibilityPhasicitySpontaneityPropertiesThrombus Aging +-----+---------------+---------+-----------+----------+--------------+ CFV  Full           Yes      Yes                                 +-----+---------------+---------+-----------+----------+--------------+   +---------+---------------+---------+-----------+----------+--------------+ LEFT     CompressibilityPhasicitySpontaneityPropertiesThrombus Aging +---------+---------------+---------+-----------+----------+--------------+ CFV      Full           Yes      Yes                                  +---------+---------------+---------+-----------+----------+--------------+ SFJ      Full                                                        +---------+---------------+---------+-----------+----------+--------------+ FV Prox  Full                                                        +---------+---------------+---------+-----------+----------+--------------+  FV Mid   Full                                                        +---------+---------------+---------+-----------+----------+--------------+ FV DistalFull                                                        +---------+---------------+---------+-----------+----------+--------------+ PFV      Full                                                        +---------+---------------+---------+-----------+----------+--------------+ POP      Full           Yes      Yes                                 +---------+---------------+---------+-----------+----------+--------------+ PTV      Full                                                        +---------+---------------+---------+-----------+----------+--------------+ PERO     Full                                                        +---------+---------------+---------+-----------+----------+--------------+     Summary: RIGHT: - No evidence of common femoral vein obstruction.   LEFT: - There is no evidence of deep vein thrombosis in the lower extremity.  - No cystic structure found in the popliteal fossa.  *See table(s) above for measurements and observations. Electronically signed by Debby Robertson on 11/24/2023 at 3:27:34 PM.    Final         LOS: 4 days   Time spent= 38 mins    Deliliah Room, MD [facility]  If 7PM-7AM, please contact night-coverage  11/25/2023, 9:32 AM

## 2023-11-25 NOTE — Plan of Care (Signed)

## 2023-11-25 NOTE — TOC CM/SW Note (Signed)
 Transition of Care [facility]) - Inpatient Brief Assessment   Patient Details  Name: Tina Curry MRN: [id number] Date of Birth: 04-Aug-1940  Transition of Care [facility]) CM/SW Contact:    Tina Barnie Rama, RN Phone Number: 11/25/2023, 5:47 PM   Clinical Narrative: From home with spouse, has PCP and insurance on file, states has no HH services in place at this time, lhas a cane at home.  States family member will transport them home at [employer] and family is support system, [facility] [address] self ambulatory with cane.   Here with 2nd degree AV Block for pacemaker,      Transition of Care Asessment: Insurance and Status: Insurance coverage has been reviewed Patient has primary care physician: Yes Home environment has been reviewed: home with spouse Prior level of function:: indep Prior/Current Home Services: No current home services Social Drivers of Health Review: SDOH reviewed no interventions necessary Readmission risk has been reviewed: Yes Transition of care needs: no transition of care needs at this time

## 2023-11-26 ENCOUNTER — Other Ambulatory Visit: Payer: Self-pay

## 2023-11-26 ENCOUNTER — Inpatient Hospital Stay (HOSPITAL_COMMUNITY)
Admission: EM | Disposition: A | Discharge status: Home / Self Care | Payer: Self-pay | Source: Other Acute Inpatient Hospital | Attending: Internal Medicine

## 2023-11-26 DIAGNOSIS — I442 Atrioventricular block, complete: Secondary | ICD-10-CM | POA: Diagnosis not present

## 2023-11-26 HISTORY — PX: PACEMAKER IMPLANT: EP1218

## 2023-11-26 LAB — SURGICAL PCR SCREEN
MRSA, PCR: NEGATIVE
Staphylococcus aureus: NEGATIVE

## 2023-11-26 SURGERY — PACEMAKER IMPLANT

## 2023-11-26 MED ORDER — SODIUM CHLORIDE 0.9 % IV SOLN: INTRAVENOUS | Status: DC

## 2023-11-26 MED ORDER — FENTANYL CITRATE (PF) 100 MCG/2ML IJ SOLN
INTRAMUSCULAR | Status: DC | PRN
  Administered 2023-11-26: 25 ug via INTRAVENOUS

## 2023-11-26 MED ORDER — SODIUM CHLORIDE 0.9 % IV SOLN
INTRAVENOUS | Status: AC
  Filled 2023-11-26: qty 2

## 2023-11-26 MED ORDER — SODIUM CHLORIDE 0.9 % IV SOLN: 250.0000 mL | INTRAVENOUS | Status: DC

## 2023-11-26 MED ORDER — METHYLPREDNISOLONE SODIUM SUCC 125 MG IJ SOLR
125.0000 mg | Freq: Once | INTRAMUSCULAR | Status: AC
  Administered 2023-11-26: 125 mg via INTRAVENOUS
  Filled 2023-11-26: qty 2

## 2023-11-26 MED ORDER — LIDOCAINE HCL (PF) 1 % IJ SOLN
INTRAMUSCULAR | Status: AC
  Filled 2023-11-26: qty 60

## 2023-11-26 MED ORDER — VANCOMYCIN HCL IN DEXTROSE 1-5 GM/200ML-% IV SOLN
1000.0000 mg | INTRAVENOUS | Status: AC
  Administered 2023-11-26: 1000 mg via INTRAVENOUS

## 2023-11-26 MED ORDER — MIDAZOLAM HCL 2 MG/2ML IJ SOLN
INTRAMUSCULAR | Status: AC
  Filled 2023-11-26: qty 2

## 2023-11-26 MED ORDER — FENTANYL CITRATE (PF) 100 MCG/2ML IJ SOLN
INTRAMUSCULAR | Status: AC
  Filled 2023-11-26: qty 2

## 2023-11-26 MED ORDER — METHYLPREDNISOLONE SODIUM SUCC 125 MG IJ SOLR: 125.0000 mg | Freq: Once | INTRAMUSCULAR | Status: DC

## 2023-11-26 MED ORDER — LIDOCAINE HCL (PF) 1 % IJ SOLN
INTRAMUSCULAR | Status: DC | PRN
  Administered 2023-11-26: 60 mL

## 2023-11-26 MED ORDER — METOPROLOL TARTRATE 25 MG PO TABS
25.0000 mg | ORAL_TABLET | Freq: Two times a day (BID) | ORAL | Status: DC
  Administered 2023-11-26 – 2023-11-27 (×2): 25 mg via ORAL
  Filled 2023-11-26 (×2): qty 1

## 2023-11-26 MED ORDER — SODIUM CHLORIDE 0.9% FLUSH
3.0000 mL | Freq: Two times a day (BID) | INTRAVENOUS | Status: DC
  Administered 2023-11-26 – 2023-11-27 (×3): 3 mL via INTRAVENOUS

## 2023-11-26 MED ORDER — VANCOMYCIN HCL IN DEXTROSE 1-5 GM/200ML-% IV SOLN
INTRAVENOUS | Status: AC
  Filled 2023-11-26: qty 200

## 2023-11-26 MED ORDER — CHLORHEXIDINE GLUCONATE 4 % EX SOLN: 60.0000 mL | Freq: Once | CUTANEOUS | Status: DC

## 2023-11-26 MED ORDER — SODIUM CHLORIDE 0.9 % IV SOLN
80.0000 mg | INTRAVENOUS | Status: AC
  Administered 2023-11-26: 80 mg

## 2023-11-26 MED ORDER — SODIUM CHLORIDE 0.9% FLUSH: 3.0000 mL | INTRAVENOUS | Status: DC | PRN

## 2023-11-26 MED ORDER — MIDAZOLAM HCL 5 MG/5ML IJ SOLN
INTRAMUSCULAR | Status: DC | PRN
  Administered 2023-11-26: 1 mg via INTRAVENOUS

## 2023-11-26 MED ORDER — DIPHENHYDRAMINE HCL 50 MG/ML IJ SOLN
25.0000 mg | Freq: Once | INTRAMUSCULAR | Status: AC
  Administered 2023-11-26: 25 mg via INTRAVENOUS
  Filled 2023-11-26: qty 1

## 2023-11-26 SURGICAL SUPPLY — 11 items
CABLE SURGICAL S-101-97-12 (CABLE) ×1 IMPLANT
CATH RIGHTSITE C315HIS02 (CATHETERS) IMPLANT
IPG PACE AZUR XT DR MRI W1DR01 (Pacemaker) IMPLANT
LEAD CAPSURE NOVUS 5076-52CM (Lead) IMPLANT
LEAD SELECT SECURE 3830 383069 (Lead) IMPLANT
PAD DEFIB RADIO PHYSIO CONN (PAD) ×1 IMPLANT
SHEATH 7FR PRELUDE SNAP 13 (SHEATH) IMPLANT
SLITTER 6232ADJ (MISCELLANEOUS) IMPLANT
TRAY PACEMAKER INSERTION (PACKS) ×1 IMPLANT
WIRE HI TORQ VERSACORE-J 145CM (WIRE) IMPLANT
WIRE MICRO SET SILHO 5FR 7 (SHEATH) IMPLANT

## 2023-11-26 NOTE — Progress Notes (Signed)
 Was contacted by the patient's nurse for the patient having tachycardia in the 100s to 130s.  Patient had a pacemaker placed earlier today on reviewing telemetry the patient appeared to be in SVT.  Ordered metoprolol  tartrate 25 mg twice daily.  Discussed this patient with the overnight fellow in case patient has continued problems with rate control overnight.  Signed,  Morse Clause, PA-C 11/26/2023, 8:10 PM

## 2023-11-26 NOTE — Progress Notes (Signed)
  Patient Name: Tina Curry Date of Encounter: 11/26/2023  Primary Cardiologist: None Electrophysiologist: None  Interval Summary   The patient is doing well today.  At this time, the patient denies chest pain, shortness of breath, or any new concerns.  Hopeful for today.   Vital Signs    Vitals:   11/25/23 1900 11/25/23 2300 11/26/23 0300 11/26/23 0754  BP: 128/72 (!) 143/57 135/73 114/79  Pulse: (!) 52 76 97 92  Resp: 19 20 20 15   Temp: 98.3 F (36.8 C) 98.7 F (37.1 C) 98.8 F (37.1 C) 97.7 F (36.5 C)  TempSrc: Oral Oral Oral Oral  SpO2: 95% 97% 97% 98%  Weight:      Height:        Intake/Output Summary (Last 24 hours) at 11/26/2023 0842 Last data filed at 11/26/2023 0300 Gross per 24 hour  Intake 240 ml  Output 1300 ml  Net -1060 ml   Filed Weights   11/23/23 0355 11/24/23 0506 11/25/23 0339  Weight: 80 kg 78.8 kg 79 kg    Physical Exam    GEN- NAD, Alert and oriented  Lungs- Clear to ausculation bilaterally, normal work of breathing Cardiac- Regular rate and rhythm, no murmurs, rubs or gallops GI- soft, NT, ND, + BS Extremities- no clubbing or cyanosis. No edema  Telemetry    SB/NSR 50-90s (personally reviewed)  Hospital Course    Tina Curry is a 83 y.o. female with h/o coronary disease, hypertension, paroxysmal atrial fibrillation and high grade AV block being seen for PPM consideration   Assessment & Plan    AV block High degree AV block Symptomatic bradycardia Despite BB washout.  Explained risks, benefits, and alternatives to PPM implantation, including but not limited to bleeding, infection, pneumothorax, pericardial effusion, lead dislodgement, heart attack, stroke, or death.  Pt verbalized understanding and agrees to proceed.    Not on [facility]   Continue clear liquids  Low grade fever On admission Afebrile > 72 hours.  Work up unremarkable. Appreciate IM assistance, who have now signed off.    For questions or updates, please contact [facility] Please consult [email or web address] for contact info under     Signed, Ozell Prentice Passey, PA-C  11/26/2023, 8:42 AM      N

## 2023-11-26 NOTE — Discharge Instructions (Signed)
 After Your Pacemaker   You have a Medtronic Pacemaker  ACTIVITY Do not lift your arm above shoulder height for 1 week after your procedure. After 7 days, you may progress as below.  You should remove your sling 24 hours after your procedure, unless otherwise instructed by your provider.     Tuesday December 03, 2023  Wednesday December 04, 2023 Thursday December 05, 2023 Friday December 06, 2023   Do not lift, push, pull, or carry anything over 10 pounds with the affected arm until 6 weeks (Tuesday January 07, 2024 ) after your procedure.   You may drive AFTER your wound check, unless you have been told otherwise by your provider.   Ask your healthcare provider when you can go back to work   INCISION/Dressing If you are on a blood thinner such as Coumadin, Xarelto , Eliquis, Plavix, or Pradaxa please confirm with your provider when this should be resumed.   If large square, outer bandage is left in place, this can be removed after 24 hours from your procedure. Do not remove steri-strips or glue as below.   If a PRESSURE DRESSING (a bulky dressing that usually goes up over your shoulder) was applied or left in place, please follow instructions given by your provider on when to return to have this removed.   Monitor your Pacemaker site for redness, swelling, and drainage. Call the device clinic at [phone] if you experience these symptoms or fever/chills.  If your incision is sealed with Steri-strips or staples, you may shower 7 days after your procedure or when told by your provider. Do not remove the steri-strips or let the shower hit directly on your site. You may wash around your site with soap and water.    If you were discharged in a sling, please do not wear this during the day more than 48 hours after your surgery unless otherwise instructed. This may increase the risk of stiffness and soreness in your shoulder.   Avoid lotions, ointments, or perfumes over your incision until it is  well-healed.  You may use a hot tub or a pool AFTER your wound check appointment if the incision is completely closed.  Pacemaker Alerts:  Some alerts are vibratory and others beep. These are NOT emergencies. Please call our office to let us  know. If this occurs at night or on weekends, it can wait until the next business day. Send a remote transmission.  If your device is capable of reading fluid status (for heart failure), you will be offered monthly monitoring to review this with you.   DEVICE MANAGEMENT Remote monitoring is used to monitor your pacemaker from home. This monitoring is scheduled every 91 days by our office. It allows us  to keep an eye on the functioning of your device to ensure it is working properly. You will routinely see your Electrophysiologist annually (more often if necessary).   You should receive your ID card for your new device in 4-8 weeks. Keep this card with you at all times once received. Consider wearing a medical alert bracelet or necklace.  Your Pacemaker may be MRI compatible. This will be discussed at your next office visit/wound check.  You should avoid contact with strong electric or magnetic fields.   Do not use amateur (ham) radio equipment or electric (arc) welding torches. MP3 player headphones with magnets should not be used. Some devices are safe to use if held at least 12 inches (30 cm) from your Pacemaker. These include power tools, lawn  mowers, and speakers. If you are unsure if something is safe to use, ask your health care provider.  When using your cell phone, hold it to the ear that is on the opposite side from the Pacemaker. Do not leave your cell phone in a pocket over the Pacemaker.  You may safely use electric blankets, heating pads, computers, and microwave ovens.  Call the office right away if: You have chest pain. You feel more short of breath than you have felt before. You feel more light-headed than you have felt before. Your  incision starts to open up.  This information is not intended to replace advice given to you by your health care provider. Make sure you discuss any questions you have with your health care provider.

## 2023-11-27 ENCOUNTER — Encounter (HOSPITAL_COMMUNITY): Payer: Self-pay | Admitting: Cardiovascular Disease

## 2023-11-27 ENCOUNTER — Inpatient Hospital Stay (HOSPITAL_COMMUNITY)

## 2023-11-27 MED ORDER — ACETAMINOPHEN 325 MG PO TABS: 650.0000 mg | ORAL_TABLET | ORAL | Status: AC | PRN

## 2023-11-27 NOTE — Discharge Summary (Signed)
 ELECTROPHYSIOLOGY PROCEDURE DISCHARGE SUMMARY    Patient ID: Tina Curry,  MRN: [id number], DOB/AGE: 83/15/1942 83 y.o.  Admit date: 11/21/2023 Discharge date: 11/27/2023  Primary Care Physician: Street, Lonni HERO, MD  Primary Cardiologist: None  Electrophysiologist: Dr. Nancey   Primary Discharge Diagnosis:  Intermittent CHB / tachy-brady status post pacemaker implantation this admission  Secondary Discharge Diagnosis:  Paroxysmal AF  Allergies  Allergen Reactions   Eliquis [Apixaban] Shortness Of Breath   Losartan Swelling   Alpha-Gal    Cephalexin    Clarithromycin     REACTION: metalic taste in mouth   Iodine     REACTION: rash,itch   Levofloxacin     Multiple side effects   Other     Seafood   Povidone-Iodine     REACTION: turned red   Ramipril Cough   Tequin [Gatifloxacin] Other (See Comments)    AWFUL TASTE   Atorvastatin Itching and Other (See Comments)    Generalized weakness  Generalized weakness  Generalized weakness  Generalized weakness, Generalized weakness   Bee Venom Rash   Iodinated Contrast Media Rash   Kiwi Extract Rash     Procedures This Admission:  1.  Implantation of a Medtronic Dual Chamber PPM on 11/26/2023 by Dr. Nancey with details as per op note.  There were no immediate post procedure complications.   2.  CXR on 11/27/2023 demonstrated no pneumothorax status post device implantation.       Brief HPI: Tina Curry is a 83 y.o. female was admitted for symptomatic bradycardia and electrophysiology team asked to see for consideration of PPM implantation.  Past medical history includes above.  The patient has had AV block without reversible causes identified.  Risks, benefits, and alternatives to PPM implantation were reviewed with the patient who wished to proceed.   Hospital Course:  The patient was admitted and underwent implantation of a Medtronic dual chamber PPM with details as outlined above.  He was monitored on telemetry  overnight which demonstrated appropriate pacing.  Left chest was without hematoma or ecchymosis.  The device was interrogated and found to be functioning normally.  CXR was obtained and demonstrated no pneumothorax status post device implantation.  Wound care, arm mobility, and restrictions were reviewed with the patient.  The patient was examined and considered stable for discharge to home.    Anticoagulation resumption This patient should resume their Xarelto  on Monday, August 11th.    Physical Exam: Vitals:   11/27/23 0300 11/27/23 0400 11/27/23 0500 11/27/23 0700  BP: 118/72 135/74 (!) 141/73 129/75  Pulse: 90 80 71 77  Resp:    18  Temp:   98.1 F (36.7 C) 98 F (36.7 C)  TempSrc:   Oral Oral  SpO2: 95% 94% 94% 97%  Weight:      Height:        GEN- NAD. A&O x 3.  HEENT: Normocephalic, atraumatic Lungs- CTAB, Normal effort.  Heart- RRR, No M/G/R.  GI- Soft, NT, ND.  Extremities- No clubbing, cyanosis, or edema;  Skin- warm and dry, no rash or lesion, left chest without hematoma/ecchymosis  Discharge Medications:  Allergies as of 11/27/2023       Reactions   Eliquis [apixaban] Shortness Of Breath   Losartan Swelling   Alpha-gal    Cephalexin    Clarithromycin    REACTION: metalic taste in mouth   Iodine    REACTION: rash,itch   Levofloxacin    Multiple side effects   Other  Seafood   Povidone-iodine    REACTION: turned red   Ramipril Cough   Tequin [gatifloxacin] Other (See Comments)   AWFUL TASTE   Atorvastatin Itching, Other (See Comments)   Generalized weakness Generalized weakness  Generalized weakness Generalized weakness, Generalized weakness   Bee Venom Rash   Iodinated Contrast Media Rash   Kiwi Extract Rash        Medication List     PAUSE taking these medications    rivaroxaban  20 MG Tabs tablet Wait to take this until: December 02, 2023 Morning Commonly known as: XARELTO  Take 1 tablet (20 mg total) by mouth daily with supper.        TAKE these medications    ACETAMINOPHEN  PO Take 1 tablet by mouth every 6 (six) hours as needed for mild pain (pain score 1-3) or moderate pain (pain score 4-6). What changed: Another medication with the same name was added. Make sure you understand how and when to take each.   acetaminophen  325 MG tablet Commonly known as: TYLENOL  Take 2 tablets (650 mg total) by mouth every 4 (four) hours as needed for headache or mild pain (pain score 1-3). What changed: You were already taking a medication with the same name, and this prescription was added. Make sure you understand how and when to take each.   amLODipine  5 MG tablet Commonly known as: NORVASC  Take 1 tablet (5 mg total) by mouth daily. What changed:  how much to take when to take this   ascorbic acid 500 MG tablet Commonly known as: VITAMIN C Take 500 mg by mouth daily.   cloNIDine  0.2 MG tablet Commonly known as: CATAPRES  Take 0.2 mg by mouth at bedtime.   DAILY PROBIOTIC PO Take 1 tablet by mouth at bedtime. Women's Probiotic   EPINEPHrine 0.3 mg/0.3 mL Soaj injection Commonly known as: EPI-PEN Inject 0.3 mg into the muscle as needed for anaphylaxis.   furosemide  40 MG tablet Commonly known as: LASIX  TAKE 1 TABLET BY MOUTH EVERY DAY What changed: when to take this   ipratropium 0.06 % nasal spray Commonly known as: ATROVENT  CAN USE TWO SPRAYS IN EACH NOSTRIL EVERY SIX HOURS TO DRY UP RUNNY NOSE. What changed: See the new instructions.   Iron 325 (65 Fe) MG Tabs Take 1 tablet by mouth daily.   magnesium oxide 400 (240 Mg) MG tablet Commonly known as: MAG-OX Take 400 mg by mouth at bedtime.   metoprolol  tartrate 25 MG tablet Commonly known as: LOPRESSOR  Take 2 tablets (50 mg total) by mouth 2 (two) times daily.   montelukast 10 MG tablet Commonly known as: SINGULAIR Take 10 mg by mouth at bedtime.   nitrofurantoin 100 MG capsule Commonly known as: MACRODANTIN Take 100 mg by mouth at bedtime.    omeprazole 40 MG capsule Commonly known as: PRILOSEC Take 40 mg by mouth in the morning.   potassium chloride  SA 20 MEQ tablet Commonly known as: KLOR-CON  M Take 20 mEq by mouth 2 (two) times daily.   pravastatin  40 MG tablet Commonly known as: PRAVACHOL  Take 1 tablet (40 mg total) by mouth daily. What changed: when to take this   STOOL SOFTENER PO Take 1 tablet by mouth daily.   VITAMIN D3 PO Take 1,000 Units by mouth in the morning.   Zinc Acetate 50 MG Caps Take 1 capsule by mouth daily.        Disposition:  Home with usual follow up as in AVS   Duration of Discharge Encounter:  APP time: 22 minutes  Signed, Ozell Prentice Passey, PA-C  11/27/2023 8:58 AM

## 2023-11-27 NOTE — TOC Transition Note (Signed)
 Transition of Care [facility]) - Discharge Note   Patient Details  Name: Tina Curry MRN: [id number] Date of Birth: 07/12/1940  Transition of [facility]) CM/SW Contact:  Waddell Barnie Rama, RN Phone Number: 11/27/2023, 9:29 AM   Clinical Narrative:    For dc today, has no needs.         Patient Goals and CMS Choice            Discharge Placement                       Discharge Plan and Services Additional resources added to the After Visit Summary for                                       Social Drivers of Health (SDOH) Interventions SDOH Screenings   Food Insecurity: No Food Insecurity (11/21/2023)  Housing: Low Risk  (11/21/2023)  Transportation Needs: No Transportation Needs (11/21/2023)  Utilities: Not At Risk (11/21/2023)  Social Connections: Socially Integrated (11/21/2023)  Tobacco Use: Medium Risk (11/21/2023)     Readmission Risk Interventions    11/25/2023    5:46 PM  Readmission Risk Prevention Plan  Medication Screening Complete  Transportation Screening Complete

## 2023-11-27 NOTE — Care Management Important Message (Signed)
 Important Message  Patient Details  Name: Tina Curry MRN: [id number] Date of Birth: [birth year]   Important Message Given:  Yes - Medicare IM     Tina Curry 11/27/2023, 11:20 AM

## 2023-11-27 NOTE — Plan of Care (Signed)
  Problem: Education: Goal: Knowledge of General Education information will improve Description: Including pain rating scale, medication(s)/side effects and non-pharmacologic comfort measures Outcome: Progressing   Problem: Health Behavior/Discharge Planning: Goal: Ability to manage health-related needs will improve Outcome: Progressing   Problem: Clinical Measurements: Goal: Ability to maintain clinical measurements within normal limits will improve Outcome: Progressing Goal: Will remain free from infection Outcome: Progressing Goal: Diagnostic test results will improve Outcome: Progressing Goal: Respiratory complications will improve Outcome: Progressing Goal: Cardiovascular complication will be avoided Outcome: Progressing   Problem: Activity: Goal: Risk for activity intolerance will decrease Outcome: Progressing   Problem: Nutrition: Goal: Adequate nutrition will be maintained Outcome: Progressing   Problem: Coping: Goal: Level of anxiety will decrease Outcome: Progressing   Problem: Elimination: Goal: Will not experience complications related to bowel motility Outcome: Progressing Goal: Will not experience complications related to urinary retention Outcome: Progressing   Problem: Pain Managment: Goal: General experience of comfort will improve and/or be controlled Outcome: Progressing   Problem: Safety: Goal: Ability to remain free from injury will improve Outcome: Progressing   Problem: Skin Integrity: Goal: Risk for impaired skin integrity will decrease Outcome: Progressing   Problem: Education: Goal: Understanding of cardiac disease, CV risk reduction, and recovery process will improve Outcome: Progressing Goal: Individualized Educational Video(s) Outcome: Progressing   Problem: Activity: Goal: Ability to tolerate increased activity will improve Outcome: Progressing   Problem: Cardiac: Goal: Ability to achieve and maintain adequate cardiovascular  perfusion will improve Outcome: Progressing   Problem: Health Behavior/Discharge Planning: Goal: Ability to safely manage health-related needs after discharge will improve Outcome: Progressing   Problem: Education: Goal: Knowledge of cardiac device and self-care will improve Outcome: Progressing Goal: Ability to safely manage health related needs after discharge will improve Outcome: Progressing Goal: Individualized Educational Video(s) Outcome: Progressing   Problem: Cardiac: Goal: Ability to achieve and maintain adequate cardiopulmonary perfusion will improve Outcome: Progressing

## 2023-11-28 LAB — CULTURE, BLOOD (ROUTINE X 2)
Culture: NO GROWTH
Culture: NO GROWTH

## 2023-11-28 MED FILL — Midazolam HCl Inj 2 MG/2ML (Base Equivalent): INTRAMUSCULAR | Qty: 1 | Status: AC

## 2023-11-29 ENCOUNTER — Telehealth: Payer: Self-pay

## 2023-11-29 NOTE — Telephone Encounter (Signed)
 Follow-up after same day discharge: Implant date: 11/26/2023 MD: Dr. Eulas Furbish Device: Medtronic [id number] Azure XT DR MRI Location: Left Chest   Wound check visit: 12/10/2023 @ 3:20 PM 90 day MD follow-up: 03/11/2024 @ 3:45 PM  Remote Transmission received:Yes  Dressing/sling removed: Yes  Confirm OAC restart on: 12/02/2023  Please continue to monitor your cardiac device site for redness, swelling, and drainage. Call the device clinic at [phone] if you experience these symptoms, fever/chills, or have questions about your device.   Remote monitoring is used to monitor your cardiac device from home. This monitoring is scheduled every 91 days by our office. It allows us  to keep an eye on the functioning of your device to ensure it is working properly.

## 2023-12-02 DIAGNOSIS — D6869 Other thrombophilia: Secondary | ICD-10-CM | POA: Diagnosis not present

## 2023-12-02 DIAGNOSIS — I10 Essential (primary) hypertension: Secondary | ICD-10-CM | POA: Diagnosis not present

## 2023-12-02 DIAGNOSIS — N1832 Chronic kidney disease, stage 3b: Secondary | ICD-10-CM | POA: Diagnosis not present

## 2023-12-02 DIAGNOSIS — I495 Sick sinus syndrome: Secondary | ICD-10-CM | POA: Diagnosis not present

## 2023-12-02 DIAGNOSIS — Z6828 Body mass index (BMI) 28.0-28.9, adult: Secondary | ICD-10-CM | POA: Diagnosis not present

## 2023-12-02 DIAGNOSIS — E663 Overweight: Secondary | ICD-10-CM | POA: Diagnosis not present

## 2023-12-02 DIAGNOSIS — Z950 Presence of cardiac pacemaker: Secondary | ICD-10-CM | POA: Diagnosis not present

## 2023-12-02 DIAGNOSIS — I480 Paroxysmal atrial fibrillation: Secondary | ICD-10-CM | POA: Diagnosis not present

## 2023-12-10 ENCOUNTER — Ambulatory Visit: Attending: Cardiology

## 2023-12-10 DIAGNOSIS — I442 Atrioventricular block, complete: Secondary | ICD-10-CM

## 2023-12-10 LAB — CUP PACEART INCLINIC DEVICE CHECK
Date Time Interrogation Session: 20250819152921
Implantable Lead Connection Status: 753985
Implantable Lead Connection Status: 753985
Implantable Lead Implant Date: 20250805
Implantable Lead Implant Date: 20250805
Implantable Lead Location: 753858
Implantable Lead Location: 753859
Implantable Lead Model: 3830
Implantable Lead Model: 5076
Implantable Pulse Generator Implant Date: 20250805

## 2023-12-10 NOTE — Progress Notes (Signed)
 Normal dual chamber pacemaker wound check. Presenting rhythm: AS/VS 91. Wound well healed. Routine testing performed. Thresholds, sensing, and impedances consistent with implant measurements and at 3.5V safety margin/auto capture until 3 month visit. No episodes. Reviewed arm restrictions to continue for 6 weeks total post op.  Pt enrolled in remote follow-up.

## 2023-12-10 NOTE — Patient Instructions (Addendum)
  After Your Pacemaker   Monitor your pacemaker site for redness, swelling, and drainage. Call the device clinic at [phone] if you experience these symptoms or fever/chills.  Your incision was closed with Steri-strips or staples:  You may shower 7 days after your procedure and wash your incision with soap and water. Avoid lotions, ointments, or perfumes over your incision until it is well-healed.  You may use a hot tub or a pool after your wound check appointment if the incision is completely closed.  Do not lift, push or pull greater than 10 pounds with the affected arm until [facility] 16th. There are no other restrictions in arm movement after your wound check appointment.  You may drive, unless driving has been restricted by your healthcare providers.  Remote monitoring is used to monitor your pacemaker from home. This monitoring is scheduled every 91 days by our office. It allows us  to keep an eye on the functioning of your device to ensure it is working properly. You will routinely see your Electrophysiologist annually (more often if necessary).

## 2023-12-11 DIAGNOSIS — N390 Urinary tract infection, site not specified: Secondary | ICD-10-CM | POA: Diagnosis not present

## 2023-12-11 DIAGNOSIS — Z6828 Body mass index (BMI) 28.0-28.9, adult: Secondary | ICD-10-CM | POA: Diagnosis not present

## 2023-12-16 ENCOUNTER — Ambulatory Visit: Payer: Self-pay | Admitting: Cardiovascular Disease

## 2023-12-26 ENCOUNTER — Ambulatory Visit: Attending: Cardiology | Admitting: Cardiology

## 2023-12-26 ENCOUNTER — Encounter: Payer: Self-pay | Admitting: Cardiology

## 2023-12-26 VITALS — BP 140/70 | HR 81 | Ht 65.0 in | Wt 180.0 lb

## 2023-12-26 DIAGNOSIS — I2510 Atherosclerotic heart disease of native coronary artery without angina pectoris: Secondary | ICD-10-CM | POA: Diagnosis not present

## 2023-12-26 DIAGNOSIS — Z950 Presence of cardiac pacemaker: Secondary | ICD-10-CM

## 2023-12-26 DIAGNOSIS — I480 Paroxysmal atrial fibrillation: Secondary | ICD-10-CM | POA: Diagnosis not present

## 2023-12-26 DIAGNOSIS — I441 Atrioventricular block, second degree: Secondary | ICD-10-CM | POA: Diagnosis not present

## 2023-12-26 DIAGNOSIS — I10 Essential (primary) hypertension: Secondary | ICD-10-CM

## 2023-12-26 DIAGNOSIS — E785 Hyperlipidemia, unspecified: Secondary | ICD-10-CM | POA: Diagnosis not present

## 2023-12-26 DIAGNOSIS — E559 Vitamin D deficiency, unspecified: Secondary | ICD-10-CM | POA: Diagnosis not present

## 2023-12-26 DIAGNOSIS — M1A0720 Idiopathic chronic gout, left ankle and foot, without tophus (tophi): Secondary | ICD-10-CM | POA: Diagnosis not present

## 2023-12-26 DIAGNOSIS — E0590 Thyrotoxicosis, unspecified without thyrotoxic crisis or storm: Secondary | ICD-10-CM | POA: Diagnosis not present

## 2023-12-26 DIAGNOSIS — K2960 Other gastritis without bleeding: Secondary | ICD-10-CM | POA: Diagnosis not present

## 2023-12-26 DIAGNOSIS — R7302 Impaired glucose tolerance (oral): Secondary | ICD-10-CM | POA: Diagnosis not present

## 2023-12-26 HISTORY — DX: Presence of cardiac pacemaker: Z95.0

## 2023-12-26 MED ORDER — POTASSIUM CHLORIDE CRYS ER 20 MEQ PO TBCR: 20.0000 meq | EXTENDED_RELEASE_TABLET | Freq: Three times a day (TID) | ORAL | 3 refills | Status: DC

## 2023-12-26 MED ORDER — FUROSEMIDE 40 MG PO TABS: 40.0000 mg | ORAL_TABLET | Freq: Every day | ORAL | 3 refills | Status: DC

## 2023-12-26 MED ORDER — FUROSEMIDE 20 MG PO TABS: 20.0000 mg | ORAL_TABLET | Freq: Every day | ORAL | 3 refills | Status: AC

## 2023-12-26 NOTE — Patient Instructions (Signed)
 Medication Instructions:    INCREASE: Lasix  to 60mg  daily  INCREASE: Potassium to 60mg  daily   Lab Work: None Ordered If you have labs (blood work) drawn today and your tests are completely normal, you will receive your results only by: MyChart Message (if you have MyChart) OR A paper copy in the mail If you have any lab test that is abnormal or we need to change your treatment, we will call you to review the results.   Testing/Procedures: None Ordered   Follow-Up: At [facility], you and your health needs are our priority.  As part of our continuing mission to provide you with exceptional heart care, we have created designated Provider Care Teams.  These Care Teams include your primary Cardiologist (physician) and Advanced Practice Providers (APPs -  Physician Assistants and Nurse Practitioners) who all work together to provide you with the care you need, when you need it.  We recommend signing up for the patient portal called MyChart.  Sign up information is provided on this After Visit Summary.  MyChart is used to connect with patients for Virtual Visits (Telemedicine).  Patients are able to view lab/test results, encounter notes, upcoming appointments, etc.  Non-urgent messages can be sent to your provider as well.   To learn more about what you can do with MyChart, go to [email or web address].    Your next appointment:   5 month(s)  The format for your next appointment:   In Person  Provider:   Lamar Fitch, MD    Other Instructions NA

## 2023-12-26 NOTE — Progress Notes (Signed)
 Cardiology Office Note:    Date:  12/26/2023   ID:  Tina Curry, DOB [birth year], MRN [id number]  PCP:  Street, Lonni HERO, MD  Cardiologist:  Lamar Fitch, MD    Referring MD: [address] Lonni HERO, *   Chief Complaint  Patient presents with   Follow-up    History of Present Illness:    Tina Curry is a 83 y.o. female with past medical history significant for coronary artery disease in November 2024 she did have coronary CT angio which showed mild disease additional problem clued paroxysmal atrial fibrillation TEE cardioversion was done in March 25, 2023 anticoagulated, dyslipidemia, high degree AV block that required recent pacemaker implantation, obstructive sleep apnea.  Comes today to my office after pacemaker plantation feeling much better have more energy.  Denies have any chest pain tightness squeezing pressure mid chest still have some swelling of lower extremities  Past Medical History:  Diagnosis Date   Allergy to alpha-gal    Aortic valve sclerosis 05/12/2021   Atrial fibrillation (HCC) 02/21/2023   Chronic cough 11/20/2021   Coronary artery disease 03/07/2023   Dyslipidemia 12/26/2016   Episodic lightheadedness 08/26/2017   Essential hypertension 07/13/2009   Qualifier: Diagnosis of   By: Lang CMA, Jessica      IMO SNOMED Dx Update Oct 2024     Goiter 12/15/2009   Qualifier: Diagnosis of   By: Jude MD, Harden ROCKFORD      IMO SNOMED Dx Update Oct 2024     Leg pain, bilateral 01/15/2018   Malaise and fatigue 11/03/2018   Meningitis    Murmur 03/06/2017   OSA (obstructive sleep apnea) 11/08/2011   PSG 10/12 mild, 9/h     Other and unspecified hyperlipidemia    Other chest pain 05/12/2021   Palpitations 12/26/2016   Paroxysmal atrial fibrillation (HCC) 03/07/2023   Peripheral vascular disease (HCC) 12/26/2016   Persistent atrial fibrillation (HCC) 03/07/2023   Viral upper respiratory tract infection 12/26/2016    Past Surgical History:  Procedure Laterality  Date   BACK SURGERY  1990 and 2001   bladdr tack  03/06/2004   CATARACT EXTRACTION Left 2011   CATARACT EXTRACTION Right 2017   CHOLECYSTECTOMY  02/18/2009   EYE SURGERY     left   FOOT SURGERY Left 1992   left   PACEMAKER IMPLANT N/A 11/26/2023   Procedure: PACEMAKER IMPLANT;  Surgeon: Nancey Eulas BRAVO, MD;  Location: [facility];  Service: Cardiovascular;  Laterality: N/A;   TUBAL LIGATION  04/23/1972   VESICOVAGINAL FISTULA CLOSURE W/ TAH  04/23/1992   VITRECTOMY Left 2010   left eye    Current Medications: Current Meds  Medication Sig   acetaminophen  (TYLENOL ) 325 MG tablet Take 2 tablets (650 mg total) by mouth every 4 (four) hours as needed for headache or mild pain (pain score 1-3).   ACETAMINOPHEN  PO Take 1 tablet by mouth every 6 (six) hours as needed for mild pain (pain score 1-3) or moderate pain (pain score 4-6).   amLODipine  (NORVASC ) 5 MG tablet Take 1 tablet (5 mg total) by mouth daily.   Cholecalciferol (VITAMIN D3 PO) Take 1,000 Units by mouth in the morning.   cloNIDine  (CATAPRES ) 0.2 MG tablet Take 0.2 mg by mouth at bedtime.   Docusate Calcium (STOOL SOFTENER PO) Take 1 tablet by mouth daily.   EPINEPHrine 0.3 mg/0.3 mL IJ SOAJ injection Inject 0.3 mg into the muscle as needed for anaphylaxis.   Ferrous Sulfate (IRON) 325 (65 Fe) MG  TABS Take 1 tablet by mouth daily.   furosemide  (LASIX ) 40 MG tablet TAKE 1 TABLET BY MOUTH EVERY DAY   ipratropium (ATROVENT ) 0.06 % nasal spray CAN USE TWO SPRAYS IN EACH NOSTRIL EVERY SIX HOURS TO DRY UP RUNNY NOSE.   magnesium oxide (MAG-OX) 400 (240 Mg) MG tablet Take 400 mg by mouth at bedtime.   metoprolol  tartrate (LOPRESSOR ) 25 MG tablet Take 2 tablets (50 mg total) by mouth 2 (two) times daily.   montelukast (SINGULAIR) 10 MG tablet Take 10 mg by mouth at bedtime.   nitrofurantoin (MACRODANTIN) 100 MG capsule Take 100 mg by mouth at bedtime.   omeprazole (PRILOSEC) 40 MG capsule Take 40 mg by mouth in the morning.    potassium chloride  SA (KLOR-CON  M) 20 MEQ tablet Take 20 mEq by mouth 2 (two) times daily.   pravastatin  (PRAVACHOL ) 40 MG tablet Take 1 tablet (40 mg total) by mouth daily.   Probiotic Product (DAILY PROBIOTIC PO) Take 1 tablet by mouth at bedtime. Women's Probiotic   rivaroxaban  (XARELTO ) 20 MG TABS tablet Take 1 tablet (20 mg total) by mouth daily with supper.   vitamin C (ASCORBIC ACID) 500 MG tablet Take 500 mg by mouth daily.   Zinc Acetate 50 MG CAPS Take 1 capsule by mouth daily.     Allergies:   Eliquis [apixaban], Losartan, Alpha-gal, Cephalexin, Clarithromycin, Iodine, Levofloxacin, Other, Povidone-iodine, Ramipril, Tequin [gatifloxacin], Atorvastatin, Bee venom, Iodinated contrast media, and Kiwi extract   Social History   Socioeconomic History   Marital status: Married    Spouse name: Not on file   Number of children: Not on file   Years of education: Not on file   Highest education level: Not on file  Occupational History   Occupation: housewife    Employer: RETIRED  Tobacco Use   Smoking status: Never    Passive exposure: Past   Smokeless tobacco: Former    Types: Snuff  Substance and Sexual Activity   Alcohol use: No   Drug use: No   Sexual activity: Not on file  Other Topics Concern   Not on file  Social History Narrative   Not on file   Social Drivers of Health   Financial Resource Strain: Not on file  Food Insecurity: No Food Insecurity (11/21/2023)   Hunger Vital Sign    Worried About Running Out of Food in the Last Year: Never true    Ran Out of Food in the Last Year: Never true  Transportation Needs: No Transportation Needs (11/21/2023)   PRAPARE - Administrator, Civil Service (Medical): No    Lack of Transportation (Non-Medical): No  Physical Activity: Not on file  Stress: Not on file  Social Connections: Socially Integrated (11/21/2023)   Social Connection and Isolation Panel    Frequency of Communication with Friends and Family: More  than three times a week    Frequency of Social Gatherings with Friends and Family: More than three times a week    Attends Religious Services: More than 4 times per year    Active Member of [employer] or Organizations: Yes    Attends Engineer, structural: More than 4 times per year    Marital Status: Married     Family History: The patient's family history includes Heart attack in her father and mother; Other in her brother and sister; Stroke in her father. ROS:   Please see the history of present illness.    All 14 point review  of systems negative except as described per history of present illness  EKGs/Labs/Other Studies Reviewed:         Recent Labs: 04/09/2023: Magnesium 1.8 04/30/2023: ALT 15 09/12/2023: NT-Pro BNP 969 11/22/2023: BUN 14; Creatinine, Ser 1.03; Hemoglobin 11.0; Platelets 209; Potassium 3.5; Sodium 140; TSH 1.824  Recent Lipid Panel    Component Value Date/Time   CHOL 199 04/30/2023 0948   TRIG 181 (H) 04/30/2023 0948   HDL 55 04/30/2023 0948   CHOLHDL 3.6 04/30/2023 0948   LDLCALC 113 (H) 04/30/2023 0948    Physical Exam:    VS:  BP (!) 140/70   Pulse 81   Ht 5' 5 (1.651 m)   Wt 180 lb (81.6 kg)   SpO2 96%   BMI 29.95 kg/m     Wt Readings from Last 3 Encounters:  12/26/23 180 lb (81.6 kg)  11/25/23 174 lb 2.6 oz (79 kg)  10/10/23 178 lb 9.6 oz (81 kg)     GEN:  Well nourished, well developed in no acute distress HEENT: Normal NECK: No JVD; No carotid bruits LYMPHATICS: No lymphadenopathy CARDIAC: RRR, no murmurs, no rubs, no gallops RESPIRATORY:  Clear to auscultation without rales, wheezing or rhonchi  ABDOMEN: Soft, non-tender, non-distended MUSCULOSKELETAL:  No edema; No deformity  SKIN: Warm and dry LOWER EXTREMITIES: no swelling NEUROLOGIC:  Alert and oriented x 3 PSYCHIATRIC:  Normal affect   ASSESSMENT:    1. Coronary artery disease involving native coronary artery of native heart without angina pectoris   2. Essential  hypertension   3. 2nd degree AV block   4. Paroxysmal atrial fibrillation (HCC)   5. Dyslipidemia   6. Pacemaker    PLAN:    In order of problems listed above:  Coronary disease stable from that point review no recent issues. Essential hypertension blood pressure decently controlled continue present management. Status post pacemaker implantation wound looking good.  She is feeling much better continue monitoring follow-up by EP team. Paroxysmal atrial fibrillation anticoagulation will be continued, will monitor frequency of atrial fibrillation by pacemaker. Dyslipidemia I did review her K PN which show me her LDL at 113 HDL 48.  We again conversed about potentially starting cholesterol medication Swelling of lower extremities,   Medication Adjustments/Labs and Tests Ordered: Current medicines are reviewed at length with the patient today.  Concerns regarding medicines are outlined above.  No orders of the defined types were placed in this encounter.  Medication changes: No orders of the defined types were placed in this encounter.   Signed, Lamar DOROTHA Fitch, MD, [facility] 12/26/2023 9:02 AM    [facility]

## 2024-01-03 DIAGNOSIS — R7302 Impaired glucose tolerance (oral): Secondary | ICD-10-CM | POA: Diagnosis not present

## 2024-01-03 DIAGNOSIS — Z23 Encounter for immunization: Secondary | ICD-10-CM | POA: Diagnosis not present

## 2024-01-03 DIAGNOSIS — L820 Inflamed seborrheic keratosis: Secondary | ICD-10-CM | POA: Diagnosis not present

## 2024-01-03 DIAGNOSIS — E663 Overweight: Secondary | ICD-10-CM | POA: Diagnosis not present

## 2024-01-03 DIAGNOSIS — Z6828 Body mass index (BMI) 28.0-28.9, adult: Secondary | ICD-10-CM | POA: Diagnosis not present

## 2024-01-03 DIAGNOSIS — I480 Paroxysmal atrial fibrillation: Secondary | ICD-10-CM | POA: Diagnosis not present

## 2024-01-03 DIAGNOSIS — D6869 Other thrombophilia: Secondary | ICD-10-CM | POA: Diagnosis not present

## 2024-01-03 DIAGNOSIS — N1832 Chronic kidney disease, stage 3b: Secondary | ICD-10-CM | POA: Diagnosis not present

## 2024-01-08 DIAGNOSIS — L821 Other seborrheic keratosis: Secondary | ICD-10-CM | POA: Diagnosis not present

## 2024-01-08 DIAGNOSIS — C44329 Squamous cell carcinoma of skin of other parts of face: Secondary | ICD-10-CM | POA: Diagnosis not present

## 2024-01-08 DIAGNOSIS — L578 Other skin changes due to chronic exposure to nonionizing radiation: Secondary | ICD-10-CM | POA: Diagnosis not present

## 2024-01-08 DIAGNOSIS — L814 Other melanin hyperpigmentation: Secondary | ICD-10-CM | POA: Diagnosis not present

## 2024-01-08 DIAGNOSIS — I872 Venous insufficiency (chronic) (peripheral): Secondary | ICD-10-CM | POA: Diagnosis not present

## 2024-01-08 DIAGNOSIS — C4442 Squamous cell carcinoma of skin of scalp and neck: Secondary | ICD-10-CM | POA: Diagnosis not present

## 2024-01-09 ENCOUNTER — Ambulatory Visit (INDEPENDENT_AMBULATORY_CARE_PROVIDER_SITE_OTHER)

## 2024-01-09 DIAGNOSIS — I442 Atrioventricular block, complete: Secondary | ICD-10-CM | POA: Diagnosis not present

## 2024-01-09 LAB — CUP PACEART REMOTE DEVICE CHECK
Battery Remaining Longevity: 168 mo
Battery Voltage: 3.22 V
Brady Statistic AP VP Percent: 0.71 %
Brady Statistic AP VS Percent: 33.7 %
Brady Statistic AS VP Percent: 0.36 %
Brady Statistic AS VS Percent: 65.14 %
Brady Statistic RA Percent Paced: 25.87 %
Brady Statistic RV Percent Paced: 21.03 %
Date Time Interrogation Session: 20250917222531
Implantable Lead Connection Status: 753985
Implantable Lead Connection Status: 753985
Implantable Lead Implant Date: 20250805
Implantable Lead Implant Date: 20250805
Implantable Lead Location: 753858
Implantable Lead Location: 753859
Implantable Lead Model: 3830
Implantable Lead Model: 5076
Implantable Pulse Generator Implant Date: 20250805
Lead Channel Impedance Value: 361 Ohm
Lead Channel Impedance Value: 380 Ohm
Lead Channel Impedance Value: 437 Ohm
Lead Channel Impedance Value: 589 Ohm
Lead Channel Pacing Threshold Amplitude: 0.75 V
Lead Channel Pacing Threshold Amplitude: 0.75 V
Lead Channel Pacing Threshold Pulse Width: 0.4 ms
Lead Channel Pacing Threshold Pulse Width: 0.4 ms
Lead Channel Sensing Intrinsic Amplitude: 0.875 mV
Lead Channel Sensing Intrinsic Amplitude: 0.875 mV
Lead Channel Sensing Intrinsic Amplitude: 15.25 mV
Lead Channel Sensing Intrinsic Amplitude: 15.25 mV
Lead Channel Setting Pacing Amplitude: 3.5 V
Lead Channel Setting Pacing Amplitude: 3.5 V
Lead Channel Setting Pacing Pulse Width: 0.4 ms
Lead Channel Setting Sensing Sensitivity: 0.9 mV
Zone Setting Status: 755011
Zone Setting Status: 755011

## 2024-01-10 ENCOUNTER — Ambulatory Visit: Admitting: Cardiology

## 2024-01-11 ENCOUNTER — Ambulatory Visit: Payer: Self-pay | Admitting: Cardiovascular Disease

## 2024-01-14 NOTE — Progress Notes (Signed)
 Remote PPM Transmission

## 2024-01-15 DIAGNOSIS — H35372 Puckering of macula, left eye: Secondary | ICD-10-CM | POA: Diagnosis not present

## 2024-01-17 ENCOUNTER — Telehealth: Payer: Self-pay | Admitting: Cardiology

## 2024-01-17 NOTE — Telephone Encounter (Signed)
 Patient stated her eye doctor wants her to have a CT scan of her head and she has a pacemaker. Patient wants to know if it will be safe for her to have that test.

## 2024-01-21 NOTE — Telephone Encounter (Signed)
 Pt is calling to check status of message

## 2024-01-21 NOTE — Telephone Encounter (Signed)
 Spoke with pt, per Dr. Monetta it will be ok for her to have a CT scan of her head with a pacemaker.

## 2024-01-23 DIAGNOSIS — N390 Urinary tract infection, site not specified: Secondary | ICD-10-CM | POA: Diagnosis not present

## 2024-01-29 DIAGNOSIS — J40 Bronchitis, not specified as acute or chronic: Secondary | ICD-10-CM | POA: Diagnosis not present

## 2024-01-29 DIAGNOSIS — J329 Chronic sinusitis, unspecified: Secondary | ICD-10-CM | POA: Diagnosis not present

## 2024-01-29 DIAGNOSIS — Z6828 Body mass index (BMI) 28.0-28.9, adult: Secondary | ICD-10-CM | POA: Diagnosis not present

## 2024-02-03 DIAGNOSIS — C4442 Squamous cell carcinoma of skin of scalp and neck: Secondary | ICD-10-CM | POA: Diagnosis not present

## 2024-02-03 DIAGNOSIS — L570 Actinic keratosis: Secondary | ICD-10-CM | POA: Diagnosis not present

## 2024-02-03 DIAGNOSIS — L820 Inflamed seborrheic keratosis: Secondary | ICD-10-CM | POA: Diagnosis not present

## 2024-02-03 DIAGNOSIS — I872 Venous insufficiency (chronic) (peripheral): Secondary | ICD-10-CM | POA: Diagnosis not present

## 2024-02-03 DIAGNOSIS — C44319 Basal cell carcinoma of skin of other parts of face: Secondary | ICD-10-CM | POA: Diagnosis not present

## 2024-02-07 ENCOUNTER — Other Ambulatory Visit: Payer: Self-pay | Admitting: Cardiology

## 2024-02-10 DIAGNOSIS — N3000 Acute cystitis without hematuria: Secondary | ICD-10-CM | POA: Diagnosis not present

## 2024-02-10 DIAGNOSIS — Z6828 Body mass index (BMI) 28.0-28.9, adult: Secondary | ICD-10-CM | POA: Diagnosis not present

## 2024-02-10 DIAGNOSIS — J019 Acute sinusitis, unspecified: Secondary | ICD-10-CM | POA: Diagnosis not present

## 2024-02-24 DIAGNOSIS — L570 Actinic keratosis: Secondary | ICD-10-CM | POA: Diagnosis not present

## 2024-02-24 DIAGNOSIS — C44319 Basal cell carcinoma of skin of other parts of face: Secondary | ICD-10-CM | POA: Diagnosis not present

## 2024-02-26 ENCOUNTER — Telehealth (HOSPITAL_COMMUNITY): Payer: Self-pay

## 2024-02-26 ENCOUNTER — Other Ambulatory Visit (HOSPITAL_COMMUNITY): Payer: Self-pay | Admitting: Internal Medicine

## 2024-02-26 ENCOUNTER — Other Ambulatory Visit: Payer: Self-pay

## 2024-02-26 ENCOUNTER — Encounter: Payer: Self-pay | Admitting: Internal Medicine

## 2024-02-26 ENCOUNTER — Ambulatory Visit: Admitting: Internal Medicine

## 2024-02-26 VITALS — BP 181/68 | HR 62 | Temp 98.2°F | Resp 16 | Ht 64.0 in | Wt 181.8 lb

## 2024-02-26 DIAGNOSIS — N390 Urinary tract infection, site not specified: Secondary | ICD-10-CM

## 2024-02-26 HISTORY — DX: Urinary tract infection, site not specified: N39.0

## 2024-02-26 MED ORDER — METHENAMINE HIPPURATE 1 G PO TABS: 1.0000 g | ORAL_TABLET | Freq: Two times a day (BID) | ORAL | 11 refills | Status: DC

## 2024-02-26 MED ORDER — METHENAMINE HIPPURATE 1 G PO TABS: 1.0000 g | ORAL_TABLET | Freq: Two times a day (BID) | ORAL | 11 refills | Status: AC

## 2024-02-26 NOTE — Progress Notes (Signed)
 Triage staff reached out to infusion team with [facility] and they are currently working on authorization. Therapy plan in place. Once authorization is complete the scheduling team will reach out to patient for scheduling one time IV infusion. Patient and daughter made aware of this during office visit.  Enis Kleine, LPN

## 2024-02-26 NOTE — Progress Notes (Signed)
 [facility]  Reason for Consult:uti Referring Provider: Lonni Rubens [facility]    Patient Active Problem List   Diagnosis Date Noted   Pacemaker 12/26/2023   Fever 11/23/2023   2nd degree AV block 11/21/2023   Allergy to alpha-gal    Meningitis    Persistent atrial fibrillation (HCC) 03/07/2023   Coronary artery disease 03/07/2023   Paroxysmal atrial fibrillation (HCC) 03/07/2023   Chronic cough 11/20/2021   Aortic valve sclerosis 05/12/2021   Malaise and fatigue 11/03/2018   Leg pain, bilateral 01/15/2018   Episodic lightheadedness 08/26/2017   Dyslipidemia 12/26/2016   Viral upper respiratory tract infection 12/26/2016   Peripheral vascular disease 12/26/2016   Palpitations 12/26/2016   OSA (obstructive sleep apnea) 11/08/2011   Goiter 12/15/2009   HYPERLIPIDEMIA 07/13/2009   Essential hypertension 07/13/2009   PULMONARY NODULE 07/13/2009      HPI: Tina Curry is a 83 y.o. female hx pacemaker placement, afib, cad, osa, alpha-gal syndrome, htn, referred for recurrent uti   I reviewed the accompanied paperwork from [facility] 02/10/24 last office visit Burning after urination x 4 days; also runny nose, cough, sinus pressure, right ear clicking for 12 days Associated urinary urgency/frequency, suprapubic spasm No fever, chill Has been taking augmentin/prednisone  Restarted cipro 250 mg bid for 7 days; restarted fluconazole 150 mg x2   Sx never went away completely on cipro and she still there today 02/26/24  She has been on nitrofurantoin since 2005   Labs: 02/10/24 ua sg 1.015; positive nitrite, normal glucose, 2+ LE; ucx >100k pseudomonas aeruginosa (S amikacin, cefepime, ceftazidime, avicaz, zerbaxa, tobramycin, meropenem, piperacillin/tazobactam; R cipro, levo) 12/26/23 cbc 5/12/223; cr 0.9, lft normal  Allergy: Noted omnicef (rash/swollen lips/difficulty breathing   Patient had a  bladder/pelvic floor lift in 2005 and has had recurrent uti since then. She has classsic uti with bouts. Sometimes urinated gross blood  Patient have seen urology - cystoscopy done previously sometimes earlier 2025. Patient never smoked. Her husband used to smoke...   Review of Systems: ROS All other ros negative      Past Medical History:  Diagnosis Date   Allergy to alpha-gal    Aortic valve sclerosis 05/12/2021   Atrial fibrillation (HCC) 02/21/2023   Chronic cough 11/20/2021   Coronary artery disease 03/07/2023   Dyslipidemia 12/26/2016   Episodic lightheadedness 08/26/2017   Essential hypertension 07/13/2009   Qualifier: Diagnosis of   By: Lang CMA, Harlene SCULL SNOMED Dx Update Oct 2024     Goiter 12/15/2009   Qualifier: Diagnosis of   By: Jude MD, Harden ROCKFORD      IMO SNOMED Dx Update Oct 2024     Leg pain, bilateral 01/15/2018   Malaise and fatigue 11/03/2018   Meningitis    Murmur 03/06/2017   OSA (obstructive sleep apnea) 11/08/2011   PSG 10/12 mild, 9/h     Other and unspecified hyperlipidemia    Other chest pain 05/12/2021   Palpitations 12/26/2016   Paroxysmal atrial fibrillation (HCC) 03/07/2023   Peripheral vascular disease 12/26/2016   Persistent atrial fibrillation (HCC) 03/07/2023   Viral upper respiratory tract infection 12/26/2016    Social History   Tobacco Use   Smoking status: Never    Passive exposure: Past   Smokeless tobacco: Former    Types: Snuff  Substance Use Topics   Alcohol use: No   Drug use: No    Family History  Problem Relation Age of Onset   Heart attack Mother    Heart attack Father    Stroke Father    Other Sister        Brain tumor   Other Brother        Brain tumor    Allergies  Allergen Reactions   Eliquis [Apixaban] Shortness Of Breath   Losartan Swelling   Alpha-Gal    Cephalexin    Clarithromycin     REACTION: metalic taste in mouth   Iodine     REACTION: rash,itch   Levofloxacin     Multiple  side effects   Other     Seafood   Povidone-Iodine     REACTION: turned red   Ramipril Cough   Tequin [Gatifloxacin] Other (See Comments)    AWFUL TASTE   Atorvastatin Itching and Other (See Comments)    Generalized weakness  Generalized weakness  Generalized weakness  Generalized weakness, Generalized weakness   Bee Venom Rash   Iodinated Contrast Media Rash   Kiwi Extract Rash    OBJECTIVE: Vitals:   02/26/24 0941  BP: (!) 181/68  Pulse: 62  Resp: 16  Temp: 98.2 F (36.8 C)  SpO2: 98%  Weight: 181 lb 12.8 oz (82.5 kg)  Height: 5' 4 (1.626 m)   Body mass index is 31.21 kg/m.   Physical Exam General/constitutional: no distress, pleasant HEENT: Normocephalic, PER, Conj Clear, EOMI, Oropharynx clear Neck supple CV: rrr no mrg Lungs: clear to auscultation, normal respiratory effort Abd: Soft, Nontender Ext: no edema Skin: No Rash Neuro: nonfocal MSK: no peripheral joint swelling/tenderness/warmth; back spines nontender   Lab: See above  Microbiology:  Serology:  Imaging:   Assessment/plan: Problem List Items Addressed This Visit   None Visit Diagnoses       Recurrent UTI    -  Primary       83 yo female hx bladder lift, pace maker presence, no diabetes, here for recurrent uti  She previously have sx of uti that have complete response to appropriate antibiotics until, current episode with sx since 02/10/24 S/s of uti Urine cx 10/20 with pseudomonas; unusual but has had abx in the past   -methenamine/vit c prophylaxis -one dose aminoglycoside among below A) amikacin 15mg /kg B) tobramycin 5mg /kg   -stop nitrofurantoin -- discussed any antibiotics prophylaxis usually stop working after a year or so and might need recycle to a different (no one antibiotics is better) -start methenamine/vit c twice a day for prophylaxis -follow up in 3 months   -if sx doesn't go away within 24-48 hours after the aminoglycoside, probably should see  urology to r/o non-infectious etiology of LUTS  (It appears our infusion center @ short stay have amikacin)    Follow-up: No follow-ups on file.  [name], MD [facility] 02/26/2024, 9:41 AM

## 2024-02-26 NOTE — Addendum Note (Signed)
 Addended by: FLORENE BOUCHARD D on: 02/26/2024 01:50 PM   Modules accepted: Orders

## 2024-02-26 NOTE — Patient Instructions (Addendum)
 Our team at the infusion center will give you one of these antibiotis once: Amikacin or tobramycin  You have no oral option for the pseudomonas (it was cipro resistant)    Stop nitrofurantoin for now; start methenamine and vitamin c (500 mg) twice a day for prophylaxis. These are not antibiotics    See me in 3 months   If your symptoms don't go away with the injection, will need to discuss with urology about non-id cause of these symptoms

## 2024-02-26 NOTE — Telephone Encounter (Signed)
 Auth Submission: NO AUTH NEEDED Site of care: Site of care: [facility] Payer: [employer] Medication & CPT/J Code(s) submitted: Amikacin (G9721) Diagnosis Code: N39.0 Route of submission (phone, fax, portal):  Phone # Fax # Auth type: Buy/Bill HB Units/visits requested: 15mg /kg x 1 dose Reference number:  Approval from: 02/26/24 to 04/22/24

## 2024-02-27 NOTE — Progress Notes (Signed)
 Patient scheduled for infusion at [facility] on 03/10/24

## 2024-03-04 DIAGNOSIS — H47292 Other optic atrophy, left eye: Secondary | ICD-10-CM | POA: Diagnosis not present

## 2024-03-06 DIAGNOSIS — H47292 Other optic atrophy, left eye: Secondary | ICD-10-CM | POA: Diagnosis not present

## 2024-03-06 DIAGNOSIS — I6789 Other cerebrovascular disease: Secondary | ICD-10-CM | POA: Diagnosis not present

## 2024-03-06 DIAGNOSIS — J320 Chronic maxillary sinusitis: Secondary | ICD-10-CM | POA: Diagnosis not present

## 2024-03-08 DIAGNOSIS — H539 Unspecified visual disturbance: Secondary | ICD-10-CM | POA: Diagnosis not present

## 2024-03-09 DIAGNOSIS — H47292 Other optic atrophy, left eye: Secondary | ICD-10-CM | POA: Diagnosis not present

## 2024-03-10 ENCOUNTER — Ambulatory Visit (HOSPITAL_COMMUNITY)
Admission: RE | Admit: 2024-03-10 | Discharge: 2024-03-10 | Disposition: A | Discharge status: Home / Self Care | Source: Ambulatory Visit | Attending: Internal Medicine | Admitting: Internal Medicine

## 2024-03-10 VITALS — BP 157/64 | HR 60 | Temp 97.7°F | Resp 16

## 2024-03-10 DIAGNOSIS — N390 Urinary tract infection, site not specified: Secondary | ICD-10-CM | POA: Diagnosis not present

## 2024-03-10 MED ORDER — DEXTROSE 5 % IV SOLN
15.0000 mg/kg | Freq: Once | INTRAVENOUS | Status: AC
  Administered 2024-03-10: 975 mg via INTRAVENOUS
  Filled 2024-03-10: qty 3.9

## 2024-03-11 ENCOUNTER — Encounter: Payer: Self-pay | Admitting: Cardiovascular Disease

## 2024-03-11 ENCOUNTER — Ambulatory Visit: Attending: Cardiovascular Disease | Admitting: Cardiovascular Disease

## 2024-03-11 VITALS — BP 157/66 | HR 69 | Resp 16 | Ht >= 80 in

## 2024-03-11 DIAGNOSIS — I442 Atrioventricular block, complete: Secondary | ICD-10-CM | POA: Diagnosis not present

## 2024-03-11 LAB — CUP PACEART INCLINIC DEVICE CHECK
Date Time Interrogation Session: 20251119162310
Implantable Lead Connection Status: 753985
Implantable Lead Connection Status: 753985
Implantable Lead Implant Date: 20250805
Implantable Lead Implant Date: 20250805
Implantable Lead Location: 753858
Implantable Lead Location: 753859
Implantable Lead Model: 3830
Implantable Lead Model: 5076
Implantable Pulse Generator Implant Date: 20250805

## 2024-03-11 NOTE — Patient Instructions (Signed)
 Medication Instructions:  Your physician recommends that you continue on your current medications as directed. Please refer to the Current Medication list given to you today.  *If you need a refill on your cardiac medications before your next appointment, please call your pharmacy*  Lab Work: None ordered.  If you have labs (blood work) drawn today and your tests are completely normal, you will receive your results only by: MyChart Message (if you have MyChart) OR A paper copy in the mail If you have any lab test that is abnormal or we need to change your treatment, we will call you to review the results.  Testing/Procedures: None ordered.   Follow-Up: At [facility], you and your health needs are our priority.  As part of our continuing mission to provide you with exceptional heart care, our providers are all part of one team.  This team includes your primary Cardiologist (physician) and Advanced Practice Providers or APPs (Physician Assistants and Nurse Practitioners) who all work together to provide you with the care you need, when you need it.  Your next appointment:   12 months with EP APP

## 2024-03-11 NOTE — Progress Notes (Signed)
  Electrophysiology Office Note:    Date:  03/11/2024   ID:  Tina Curry, DOB 01/04/1941, MRN [id number]  PCP:  Street, Lonni HERO, MD   [facility] Providers Cardiologist:  None     Referring MD: Street, Lonni HERO, *   History of Present Illness:    Tina Curry is a 83 y.o. female with a medical history significant for high-grade AV block and placement of a Medtronic dual-chamber pacemaker who presents for follow-up.      History of Present Illness He is admitted to [facility] in July 2025 with symptoms of weakness and postural dizziness for a week and found to be in paroxysmal AV block.  She underwent placement of a Medtronic dual-chamber pacemaker.  Since device placement, she notes that her symptoms of dizziness and weakness have resolved.  She does note ongoing dyspnea on exertion.         Today, she reports that she is at baseline and has no acute complaints.  EKGs/Labs/Other Studies Reviewed Today:     Echocardiogram:  TTE October 09, 2023 LVEF 60 to 65%.  Grade 2 diastolic dysfunction.  Small pericardial effusion.     EKG:   EKG Interpretation Date/Time:  Wednesday March 11 2024 15:16:42 EST Ventricular Rate:  65 PR Interval:  284 QRS Duration:  76 QT Interval:  398 QTC Calculation: 413 R Axis:   -24  Text Interpretation: Sinus rhythm with 1st degree A-V block Low voltage QRS When compared with ECG of 27-Nov-2023 06:00, No significant change was found Confirmed by Nancey Scotts [phone]) on 03/11/2024 4:08:44 PM     Physical Exam:    VS:  BP (!) 157/66 (BP Location: Left Arm, Patient Position: Sitting, Cuff Size: Large)   Pulse 69   Resp 16   Ht 8' (2.438 m)   SpO2 96%   BMI 13.87 kg/m     Wt Readings from Last 3 Encounters:  02/26/24 181 lb 12.8 oz (82.5 kg)  12/26/23 180 lb (81.6 kg)  11/25/23 174 lb 2.6 oz (79 kg)     GEN: Well nourished, well developed in no acute distress CARDIAC: RRR, no murmurs, rubs,  gallops RESPIRATORY:  Normal work of breathing MUSCULOSKELETAL: no edema    ASSESSMENT & PLAN:     High-grade AV block Status post Medtronic dual-chamber pacemaker I reviewed today's interrogation.  See Paceart for details She is not device dependent  Paroxysmal atrial fibrillation Low burden, rates controlled A-fib burden 11.2%, minimal if any symptoms No plan for rhythm control currently Continue Xarelto     Signed, Scotts FORBES Nancey, MD  03/11/2024 4:19 PM    [facility]

## 2024-03-23 ENCOUNTER — Ambulatory Visit: Payer: Self-pay | Admitting: Cardiovascular Disease

## 2024-03-31 ENCOUNTER — Other Ambulatory Visit: Payer: Self-pay | Admitting: Cardiology

## 2024-03-31 NOTE — Telephone Encounter (Signed)
 Prescription refill request for Xarelto  received.  Indication:afib Last office visit:11/25 Weight:82.5  kg Age:83 Scr:1.03  8/25 CrCl:53.9  ml/min  Prescription refilled

## 2024-04-09 ENCOUNTER — Ambulatory Visit

## 2024-04-09 DIAGNOSIS — I442 Atrioventricular block, complete: Secondary | ICD-10-CM

## 2024-04-10 LAB — CUP PACEART REMOTE DEVICE CHECK
Battery Remaining Longevity: 172 mo
Battery Voltage: 3.2 V
Brady Statistic AP VP Percent: 0.35 %
Brady Statistic AP VS Percent: 16.47 %
Brady Statistic AS VP Percent: 0.34 %
Brady Statistic AS VS Percent: 82.7 %
Brady Statistic RA Percent Paced: 10.89 %
Brady Statistic RV Percent Paced: 27.7 %
Date Time Interrogation Session: 20251217212012
Implantable Lead Connection Status: 753985
Implantable Lead Connection Status: 753985
Implantable Lead Implant Date: 20250805
Implantable Lead Implant Date: 20250805
Implantable Lead Location: 753858
Implantable Lead Location: 753859
Implantable Lead Model: 3830
Implantable Lead Model: 5076
Implantable Pulse Generator Implant Date: 20250805
Lead Channel Impedance Value: 342 Ohm
Lead Channel Impedance Value: 380 Ohm
Lead Channel Impedance Value: 570 Ohm
Lead Channel Impedance Value: 741 Ohm
Lead Channel Pacing Threshold Amplitude: 1 V
Lead Channel Pacing Threshold Amplitude: 1.125 V
Lead Channel Pacing Threshold Pulse Width: 0.4 ms
Lead Channel Pacing Threshold Pulse Width: 0.4 ms
Lead Channel Sensing Intrinsic Amplitude: 1.625 mV
Lead Channel Sensing Intrinsic Amplitude: 1.625 mV
Lead Channel Sensing Intrinsic Amplitude: 16.125 mV
Lead Channel Sensing Intrinsic Amplitude: 16.125 mV
Lead Channel Setting Pacing Amplitude: 2 V
Lead Channel Setting Pacing Amplitude: 2.25 V
Lead Channel Setting Pacing Pulse Width: 0.4 ms
Lead Channel Setting Sensing Sensitivity: 0.9 mV
Zone Setting Status: 755011
Zone Setting Status: 755011

## 2024-04-12 NOTE — Progress Notes (Signed)
 Remote PPM Transmission

## 2024-04-14 ENCOUNTER — Other Ambulatory Visit: Payer: Self-pay | Admitting: Allergy and Immunology

## 2024-04-20 ENCOUNTER — Encounter: Payer: Self-pay | Admitting: Allergy and Immunology

## 2024-04-20 ENCOUNTER — Ambulatory Visit: Payer: PPO | Admitting: Allergy and Immunology

## 2024-04-20 VITALS — BP 136/82 | HR 66 | Resp 16 | Ht 64.5 in | Wt 184.4 lb

## 2024-04-20 DIAGNOSIS — T7819XD Other adverse food reactions, not elsewhere classified, subsequent encounter: Secondary | ICD-10-CM | POA: Diagnosis not present

## 2024-04-20 DIAGNOSIS — J310 Chronic rhinitis: Secondary | ICD-10-CM | POA: Diagnosis not present

## 2024-04-20 DIAGNOSIS — Z91014 Allergy to mammalian meats: Secondary | ICD-10-CM | POA: Diagnosis not present

## 2024-04-20 MED ORDER — EPINEPHRINE 0.3 MG/0.3ML IJ SOAJ: INTRAMUSCULAR | 3 refills | Status: AC

## 2024-04-20 NOTE — Progress Notes (Unsigned)
 "  [place] - [place] - [place] - [place] - [facility]   Follow-up Note  Referring Provider: Street, Lonni HERO, MD Primary Provider: Street, Lonni HERO, MD Date of Office Visit: 04/20/2024  Subjective:   Tina Curry (DOB: 1941/01/03) is a 83 y.o. female who [facility] in re-evaluation of the following:  HPI: Nimrat returns to this clinic in evaluation of alpha gal syndrome, shellfish allergy, history of allergic rhinitis.  I last saw her in this clinic 22 April 2023.  She has not had any allergic reactions while remaining away from mammal consumption.  She also remains away from seafood consumption although she eats tuna with no problem.  This predominantly shellfish that she remains away from at this point in time.  For some reason salmon makes her GI tract get upset.  She is doing very well with her airway while using montelukast on a consistent basis and occasionally some nasal ipratropium.  She has obtained this years flu vaccine.  Allergies as of 04/20/2024       Reactions   Eliquis [apixaban] Shortness Of Breath   Losartan Swelling   Alpha-gal    Cephalexin    Clarithromycin    REACTION: metalic taste in mouth   Iodine    REACTION: rash,itch   Levofloxacin    Multiple side effects   Other    Seafood   Povidone-iodine    REACTION: turned red   Ramipril Cough   Tequin [gatifloxacin] Other (See Comments)   AWFUL TASTE   Atorvastatin Itching, Other (See Comments)   Generalized weakness Generalized weakness  Generalized weakness Generalized weakness, Generalized weakness   Bee Venom Rash   Iodinated Contrast Media Rash   Kiwi Extract Rash        Medication List    ACETAMINOPHEN  PO Take 1 tablet by mouth every 6 (six) hours as needed for mild pain (pain score 1-3) or moderate pain (pain score 4-6).   acetaminophen  325 MG tablet Commonly known as: TYLENOL  Take 2 tablets (650 mg total) by mouth every  4 (four) hours as needed for headache or mild pain (pain score 1-3).   amLODipine  5 MG tablet Commonly known as: NORVASC  Take 1 tablet (5 mg total) by mouth daily.   ascorbic acid 500 MG tablet Commonly known as: VITAMIN C Take 500 mg by mouth daily.   cloNIDine  0.2 MG tablet Commonly known as: CATAPRES  Take 0.2 mg by mouth at bedtime.   DAILY PROBIOTIC PO Take 1 tablet by mouth at bedtime. Women's Probiotic   EPINEPHrine  0.3 mg/0.3 mL Soaj injection Commonly known as: EPI-PEN Inject 0.3 mg into the muscle as needed for anaphylaxis.   furosemide  20 MG tablet Commonly known as: LASIX  Take 1 tablet (20 mg total) by mouth daily.   ipratropium 0.06 % nasal spray Commonly known as: ATROVENT  CAN USE TWO SPRAYS IN EACH NOSTRIL EVERY SIX HOURS TO DRY UP RUNNY NOSE.   Iron 325 (65 Fe) MG Tabs Take 1 tablet by mouth daily.   magnesium oxide 400 (240 Mg) MG tablet Commonly known as: MAG-OX Take 400 mg by mouth at bedtime.   methenamine  1 g tablet Commonly known as: HIPREX  Take 1 tablet (1 g total) by mouth 2 (two) times daily with a meal. Take this tablet twice a day along with 500 mg of vit C each time   metoprolol  tartrate 25 MG tablet Commonly known as: LOPRESSOR  Take 2 tablets (50 mg total) by mouth 2 (  two) times daily.   montelukast 10 MG tablet Commonly known as: SINGULAIR Take 10 mg by mouth at bedtime.   omeprazole 40 MG capsule Commonly known as: PRILOSEC Take 40 mg by mouth in the morning.   potassium chloride  SA 20 MEQ tablet Commonly known as: KLOR-CON  M Take 1 tablet (20 mEq total) by mouth 3 (three) times daily.   pravastatin  40 MG tablet Commonly known as: PRAVACHOL  Take 1 tablet (40 mg total) by mouth daily.   STOOL SOFTENER PO Take 1 tablet by mouth daily.   VITAMIN D3 PO Take 1,000 Units by mouth in the morning.   Xarelto  20 MG Tabs tablet Generic drug: rivaroxaban  TAKE 1 TABLET BY MOUTH DAILY WITH SUPPER.   Zinc Acetate 50 MG Caps Take 1  capsule by mouth daily.    Past Medical History:  Diagnosis Date   Allergy to alpha-gal    Aortic valve sclerosis 05/12/2021   Atrial fibrillation (HCC) 02/21/2023   Chronic cough 11/20/2021   Coronary artery disease 03/07/2023   Dyslipidemia 12/26/2016   Episodic lightheadedness 08/26/2017   Essential hypertension 07/13/2009   Qualifier: Diagnosis of   By: Lang CMA, Harlene SCULL SNOMED Dx Update Oct 2024     Goiter 12/15/2009   Qualifier: Diagnosis of   By: Jude MD, Harden ROCKFORD      IMO SNOMED Dx Update Oct 2024     Leg pain, bilateral 01/15/2018   Malaise and fatigue 11/03/2018   Meningitis    Murmur 03/06/2017   OSA (obstructive sleep apnea) 11/08/2011   PSG 10/12 mild, 9/h     Other and unspecified hyperlipidemia    Other chest pain 05/12/2021   Palpitations 12/26/2016   Paroxysmal atrial fibrillation (HCC) 03/07/2023   Peripheral vascular disease 12/26/2016   Persistent atrial fibrillation (HCC) 03/07/2023   Viral upper respiratory tract infection 12/26/2016    Past Surgical History:  Procedure Laterality Date   BACK SURGERY  1990 and 2001   bladdr tack  03/06/2004   CATARACT EXTRACTION Left 2011   CATARACT EXTRACTION Right 2017   CHOLECYSTECTOMY  02/18/2009   EYE SURGERY     left   FOOT SURGERY Left 1992   left   PACEMAKER IMPLANT N/A 11/26/2023   Procedure: PACEMAKER IMPLANT;  Surgeon: Nancey Eulas BRAVO, MD;  Location: [facility];  Service: Cardiovascular;  Laterality: N/A;   TUBAL LIGATION  04/23/1972   VESICOVAGINAL FISTULA CLOSURE W/ TAH  04/23/1992   VITRECTOMY Left 2010   left eye    Review of systems negative except as noted in HPI / PMHx or noted below:  Review of Systems  Constitutional: Negative.   HENT: Negative.    Eyes: Negative.   Respiratory: Negative.    Cardiovascular: Negative.   Gastrointestinal: Negative.   Genitourinary: Negative.   Musculoskeletal: Negative.   Skin: Negative.   Neurological: Negative.    Endo/Heme/Allergies: Negative.   Psychiatric/Behavioral: Negative.       Objective:   Vitals:   04/20/24 0823  BP: 136/82  Pulse: 66  Resp: 16  SpO2: 99%   Height: 5' 4.5 (163.8 cm)  Weight: 184 lb 6.4 oz (83.6 kg)   Physical Exam Constitutional:      Appearance: She is not diaphoretic.  HENT:     Head: Normocephalic.     Right Ear: Tympanic membrane, ear canal and external ear normal.     Left Ear: Tympanic membrane, ear canal and external ear normal.  Nose: Nose normal. No mucosal edema or rhinorrhea.     Mouth/Throat:     Pharynx: Uvula midline. No oropharyngeal exudate.  Eyes:     Conjunctiva/sclera: Conjunctivae normal.  Neck:     Thyroid : No thyromegaly.     Trachea: Trachea normal. No tracheal tenderness or tracheal deviation.  Cardiovascular:     Rate and Rhythm: Normal rate and regular rhythm.     Heart sounds: Normal heart sounds, S1 normal and S2 normal. No murmur heard. Pulmonary:     Effort: No respiratory distress.     Breath sounds: Normal breath sounds. No stridor. No wheezing or rales.  Lymphadenopathy:     Head:     Right side of head: No tonsillar adenopathy.     Left side of head: No tonsillar adenopathy.     Cervical: No cervical adenopathy.  Skin:    Findings: No erythema or rash.     Nails: There is no clubbing.  Neurological:     Mental Status: She is alert.     Diagnostics: none  Assessment and Plan:   1. Alpha-gal syndrome   2. Non-allergic rhinitis    1.  Avoid mammal meat consumption, seafood consumption  2. Can continue montelukast 10 mg - 1 tablet 1 time per day  3. Can use nasal ipratropium 0.06% - 2 sprays each nostril every 6 hours to dry nose  4. Can use Epi-pen, benadryl , MD/ER evaluation for allergic reaction  5. Obtain blood - alpha-gal panel  6. Return to clinic in 1 year or earlier if problem  7.  Influenza = Tamiflu.  COVID = molnupiravir  We will check Marice titer of alpha gal and if it is low we  will consider mammal consumption.  She can remain on a leukotriene modifier and if needed some nasal ipratropium to help with her airway disease.  I will contact her with results of her blood test once they are available for review.  Camellia Denis, MD Allergy / Immunology [facility] "

## 2024-04-20 NOTE — Patient Instructions (Signed)
" °  1.  Avoid mammal meat consumption, seafood consumption  2. Can continue montelukast 10 mg - 1 tablet 1 time per day  3. Can use nasal ipratropium 0.06% - 2 sprays each nostril every 6 hours to dry nose  4. Can use Epi-pen, benadryl , MD/ER evaluation for allergic reaction  5. Obtain blood - alpha-gal panel  6. Return to clinic in 1 year or earlier if problem  7.  Influenza = Tamiflu.  COVID = molnupiravir "

## 2024-04-21 ENCOUNTER — Encounter: Payer: Self-pay | Admitting: Allergy and Immunology

## 2024-04-22 ENCOUNTER — Ambulatory Visit: Payer: Self-pay | Admitting: Cardiovascular Disease

## 2024-04-23 LAB — ALPHA-GAL PANEL
Allergen Lamb IgE: 3.73 kU/L — AB
Beef IgE: 4.51 kU/L — AB
IgE (Immunoglobulin E), Serum: 197 [IU]/mL (ref 6–495)
O215-IgE Alpha-Gal: 11.7 kU/L — AB
Pork IgE: 1.31 kU/L — AB

## 2024-04-27 ENCOUNTER — Ambulatory Visit: Payer: Self-pay | Admitting: Allergy and Immunology

## 2024-05-12 ENCOUNTER — Other Ambulatory Visit: Payer: Self-pay | Admitting: Allergy and Immunology

## 2024-05-26 ENCOUNTER — Ambulatory Visit: Admitting: Internal Medicine

## 2024-05-27 ENCOUNTER — Encounter: Payer: Self-pay | Admitting: Cardiology

## 2024-05-27 ENCOUNTER — Telehealth: Payer: Self-pay | Admitting: *Deleted

## 2024-05-27 ENCOUNTER — Ambulatory Visit: Admitting: Cardiology

## 2024-05-27 VITALS — BP 140/80 | HR 80 | Ht 64.0 in | Wt 183.2 lb

## 2024-05-27 DIAGNOSIS — G4733 Obstructive sleep apnea (adult) (pediatric): Secondary | ICD-10-CM

## 2024-05-27 DIAGNOSIS — I10 Essential (primary) hypertension: Secondary | ICD-10-CM | POA: Diagnosis not present

## 2024-05-27 DIAGNOSIS — I480 Paroxysmal atrial fibrillation: Secondary | ICD-10-CM | POA: Diagnosis not present

## 2024-05-27 DIAGNOSIS — Z1321 Encounter for screening for nutritional disorder: Secondary | ICD-10-CM | POA: Diagnosis not present

## 2024-05-27 DIAGNOSIS — E785 Hyperlipidemia, unspecified: Secondary | ICD-10-CM | POA: Diagnosis not present

## 2024-05-27 DIAGNOSIS — I2510 Atherosclerotic heart disease of native coronary artery without angina pectoris: Secondary | ICD-10-CM | POA: Diagnosis not present

## 2024-05-27 DIAGNOSIS — R0609 Other forms of dyspnea: Secondary | ICD-10-CM

## 2024-05-27 MED ORDER — POTASSIUM CHLORIDE CRYS ER 20 MEQ PO TBCR: 20.0000 meq | EXTENDED_RELEASE_TABLET | Freq: Two times a day (BID) | ORAL | Status: AC

## 2024-05-27 NOTE — Addendum Note (Signed)
 Addended by: GLENFORD ALAN CROME on: 05/27/2024 09:47 AM   Modules accepted: Orders

## 2024-05-27 NOTE — Patient Instructions (Addendum)
 Medication Instructions:  Your physician recommends that you continue on your current medications as directed. Please refer to the Current Medication list given to you today.  *If you need a refill on your cardiac medications before your next appointment, please call your pharmacy*   Lab Work: CMET, Lipids, CBC, TSH, A1C, and Vit D If you have labs (blood work) drawn today and your tests are completely normal, you will receive your results only by: MyChart Message (if you have MyChart) OR A paper copy in the mail If you have any lab test that is abnormal or we need to change your treatment, we will call you to review the results.   Testing/Procedures: You are scheduled for a Myocardial Perfusion Imaging Study.  Please arrive 15 minutes prior to your appointment time for registration and insurance purposes.  The test will take approximately 3 to 4 hours to complete; you may bring reading material.  If someone comes with you to your appointment, they will need to remain in the main lobby due to limited space in the testing area.   How to prepare for your Myocardial Perfusion Test: Do not eat or drink 3 hours prior to your test, except you may have water. Do not consume products containing caffeine (regular or decaffeinated) 12 hours prior to your test. (ex: coffee, chocolate, sodas, tea). Do bring a list of your current medications with you.  If not listed below, you may take your medications as normal. Do not take metoprolol  (Lopressor , Toprol ) for 24 hours prior to the test.  Bring the medication to your appointment as you may be required to take it once the test is complete. Do wear comfortable clothes (no dresses or overalls) and walking shoes, tennis shoes preferred (No heels or open toe shoes are allowed). Do NOT wear cologne, perfume, aftershave, or lotions (deodorant is allowed). If these instructions are not followed, your test will have to be rescheduled.  If you cannot keep your  appointment, please provide 24 hours notification to the Nuclear Lab, to avoid a possible $50 charge to your account.  Follow-Up: At [facility], you and your health needs are our priority.  As part of our continuing mission to provide you with exceptional heart care, we have created designated Provider Care Teams.  These Care Teams include your primary Cardiologist (physician) and Advanced Practice Providers (APPs -  Physician Assistants and Nurse Practitioners) who all work together to provide you with the care you need, when you need it.  We recommend signing up for the patient portal called MyChart.  Sign up information is provided on this After Visit Summary.  MyChart is used to connect with patients for Virtual Visits (Telemedicine).  Patients are able to view lab/test results, encounter notes, upcoming appointments, etc.  Non-urgent messages can be sent to your provider as well.   To learn more about what you can do with MyChart, go to [email or web address].    Your next appointment:   9 month(s)  Provider:   Jennifer Crape, MD   Other Instructions  Cardiac Nuclear Scan A cardiac nuclear scan is a test that is done to check the flow of blood to your heart. It is done when you are resting and when you are exercising. The test looks for problems such as: Not enough blood reaching a portion of the heart. The heart muscle not working as it should. You may need this test if you have: Heart disease. Lab results that are not normal. Had heart  surgery or a balloon procedure to open up blocked arteries (angioplasty) or a small mesh tube (stent). Chest pain. Shortness of breath. Had a heart attack. In this test, a special dye (tracer) is put into your bloodstream. The tracer will travel to your heart. A camera will then take pictures of your heart to see how the tracer moves through your heart. This test is usually done at a hospital and takes 2-4 hours. Tell a doctor  about: Any allergies you have. All medicines you are taking, including vitamins, herbs, eye drops, creams, and over-the-counter medicines. Any bleeding problems you have. Any surgeries you have had. Any medical conditions you have. Whether you are pregnant or may be pregnant. Any history of asthma or long-term (chronic) lung disease. Any history of heart rhythm disorders or heart valve conditions. What are the risks? Your doctor will talk with you about risks. These may include: Serious chest pain and heart attack. This is only a risk if the stress portion of the test is done. Fast or uneven heartbeats (palpitations). A feeling of warmth in your chest. This feeling usually does not last long. Allergic reaction to the tracer. Shortness of breath or trouble breathing. What happens before the test? Ask your doctor about changing or stopping your normal medicines. Follow instructions from your doctor about what you cannot eat or drink. Remove your jewelry on the day of the test. Ask your doctor if you need to avoid nicotine or caffeine. What happens during the test? An IV tube will be inserted into one of your veins. Your doctor will give you a small amount of tracer through the IV tube. You will wait for 20-40 minutes while the tracer moves through your bloodstream. Your heart will be monitored with an electrocardiogram (ECG). You will lie down on an exam table. Pictures of your heart will be taken for about 15-20 minutes. You may also have a stress test. For this test, one of these things may be done: You will be asked to exercise on a treadmill or a stationary bike. You will be given medicines that will make your heart work harder. This is done if you are unable to exercise. When blood flow to your heart has peaked, a tracer will again be given through the IV tube. After 20-40 minutes, you will get back on the exam table. More pictures will be taken of your heart. Depending on the  tracer that is used, more pictures may need to be taken 3-4 hours later. Your IV tube will be removed when the test is over. The test may vary among doctors and hospitals. What happens after the test? Ask your doctor: Whether you can return to your normal schedule, including diet, activities, travel, and medicines. Whether you should drink more fluids. This will help to remove the tracer from your body. Ask your doctor, or the department that is doing the test: When will my results be ready? How will I get my results? What are my treatment options? What other tests do I need? What are my next steps? This information is not intended to replace advice given to you by your health care provider. Make sure you discuss any questions you have with your health care provider. Document Revised: 09/05/2021 Document Reviewed: 09/05/2021 Elsevier Patient Education  2023 [employer].

## 2024-05-27 NOTE — Progress Notes (Signed)
 " Cardiology Office Note:    Date:  05/27/2024   ID:  Tina Curry, DOB [birth year], MRN [id number]  PCP:  Street, Lonni HERO, MD  Cardiologist:  Jennifer JONELLE Crape, MD   Referring MD: Street, Lonni HERO, MD    ASSESSMENT:    1. Coronary artery disease involving native coronary artery of native heart without angina pectoris   2. Essential hypertension   3. Paroxysmal atrial fibrillation (HCC)   4. OSA (obstructive sleep apnea)    PLAN:    In order of problems listed above:  Coronary artery disease: Dyspnea on exertion: Secondary prevention stressed with the patient.  Importance of compliance with diet medication stressed and patient verbalized standing.  Patient was advised to begin evaluation with Lexiscan  sestamibi.  She is agreeable.  I discussed CT coronary angiography report 08/11/2022 and questions were answered to her satisfaction. Paroxysmal atrial fibrillation:I discussed with the patient atrial fibrillation, disease process. Management and therapy including rate and rhythm control, anticoagulation benefits and potential risks were discussed extensively with the patient. Patient had multiple questions which were answered to patient's satisfaction. Mixed dyslipidemia: She is fasting and we will get complete blood work.  She is agreeable.  Goal LDL less than 60. Essential hypertension.  Blood pressure stable and diet was emphasized.  Lifestyle modification urged salt intake issues were discussed Heart Block high-grade: Post permanent pacemaker: Followed by electrophysiology colleagues. Obesity: Weight reduction stressed diet emphasized and she promises to do better.  Risks of obesity explained. Patient will be seen in follow-up appointment in 9 months or earlier if the patient has any concerns.    Medication Adjustments/Labs and Tests Ordered: Current medicines are reviewed at length with the patient today.  Concerns regarding medicines are outlined above.  No orders of the  defined types were placed in this encounter.  Meds ordered this encounter  Medications   potassium chloride  SA (KLOR-CON  M) 20 MEQ tablet    Sig: Take 1 tablet (20 mEq total) by mouth 2 (two) times daily.     No chief complaint on file.    History of Present Illness:    Tina Curry is a 84 y.o. female.  Patient has past medical history of coronary artery disease nonobstructive in nature, essential hypertension, mixed dyslipidemia and permanent pacemaker for high degree AV block.  She has paroxysmal atrial fibrillation on anticoagulation with she denies any problems at this time and takes care of activities of daily living.  No chest pain orthopnea or PND.  She has some shortness of breath on exertion.  At the time of my evaluation, the patient is alert awake oriented and in no distress.  Past Medical History:  Diagnosis Date   2nd degree AV block 11/21/2023   Allergy to alpha-gal    Aortic valve sclerosis 05/12/2021   Chronic cough 11/20/2021   Coronary artery disease 03/07/2023   Dyslipidemia 12/26/2016   Episodic lightheadedness 08/26/2017   Essential hypertension 07/13/2009   Qualifier: Diagnosis of   By: Lang CMA, Jessica      IMO SNOMED Dx Update Oct 2024     Fever 11/23/2023   Goiter 12/15/2009   Qualifier: Diagnosis of   By: Jude MD, Harden [place]      IMO SNOMED Dx Update Oct 2024     Leg pain, bilateral 01/15/2018   Malaise and fatigue 11/03/2018   Meningitis    OSA (obstructive sleep apnea) 11/08/2011   PSG 10/12 mild, 9/h     Other and unspecified hyperlipidemia  Pacemaker 12/26/2023   Palpitations 12/26/2016   Paroxysmal atrial fibrillation (HCC) 03/07/2023   Peripheral vascular disease 12/26/2016   Persistent atrial fibrillation (HCC) 03/07/2023   Recurrent UTI 02/26/2024   Viral upper respiratory tract infection 12/26/2016    Past Surgical History:  Procedure Laterality Date   BACK SURGERY  1990 and 2001   bladdr tack  03/06/2004   CATARACT EXTRACTION  Left 2011   CATARACT EXTRACTION Right 2017   CHOLECYSTECTOMY  02/18/2009   EYE SURGERY     left   FOOT SURGERY Left 1992   left   PACEMAKER IMPLANT N/A 11/26/2023   Procedure: PACEMAKER IMPLANT;  Surgeon: Nancey Eulas BRAVO, MD;  Location: [facility];  Service: Cardiovascular;  Laterality: N/A;   TUBAL LIGATION  04/23/1972   VESICOVAGINAL FISTULA CLOSURE W/ TAH  04/23/1992   VITRECTOMY Left 2010   left eye    Current Medications: Active Medications[1]   Allergies:   Eliquis [apixaban], Losartan, Alpha-gal, Cephalexin, Clarithromycin, Iodine, Levofloxacin, Other, Povidone-iodine, Ramipril, Tequin [gatifloxacin], Atorvastatin, Bee venom, Iodinated contrast media, and Kiwi extract   Social History   Socioeconomic History   Marital status: Married    Spouse name: Not on file   Number of children: Not on file   Years of education: Not on file   Highest education level: Not on file  Occupational History   Occupation: housewife    Employer: RETIRED  Tobacco Use   Smoking status: Never    Passive exposure: Past   Smokeless tobacco: Former    Types: Snuff  Substance and Sexual Activity   Alcohol use: No   Drug use: No   Sexual activity: Not on file  Other Topics Concern   Not on file  Social History Narrative   Not on file   Social Drivers of Health   Tobacco Use: Medium Risk (05/27/2024)   Patient History    Smoking Tobacco Use: Never    Smokeless Tobacco Use: Former    Passive Exposure: Past  Programmer, Applications: Not on [employer] Insecurity: No Food Insecurity (11/21/2023)   Epic    Worried About Programme Researcher, Broadcasting/film/video in the Last Year: Never true    Ran Out of Food in the Last Year: Never true  Transportation Needs: No Transportation Needs (11/21/2023)   Epic    Lack of Transportation (Medical): No    Lack of Transportation (Non-Medical): No  Physical Activity: Not on file  Stress: Not on file  Social Connections: Socially Integrated (11/21/2023)    Social Connection and Isolation Panel    Frequency of Communication with Friends and Family: More than three times a week    Frequency of Social Gatherings with Friends and Family: More than three times a week    Attends Religious Services: More than 4 times per year    Active Member of Clubs or Organizations: Yes    Attends Banker Meetings: More than 4 times per year    Marital Status: Married  Depression (PHQ2-9): Low Risk (02/26/2024)   Depression (PHQ2-9)    PHQ-2 Score: 0  Alcohol Screen: Not on file  Housing: Low Risk (11/21/2023)   Epic    Unable to Pay for Housing in the Last Year: No    Number of Times Moved in the Last Year: 0    Homeless in the Last Year: No  Utilities: Not At Risk (11/21/2023)   Epic    Threatened with loss of utilities: No  Health Literacy:  Not on file     Family History: The patient's family history includes Heart attack in her father and mother; Other in her brother and sister; Stroke in her father.  ROS:   Please see the history of present illness.    All other systems reviewed and are negative.  EKGs/Labs/Other Studies Reviewed:    The following studies were reviewed today: .SABRA      Recent Labs: 09/12/2023: NT-Pro BNP 969 11/22/2023: BUN 14; Creatinine, Ser 1.03; Hemoglobin 11.0; Platelets 209; Potassium 3.5; Sodium 140; TSH 1.824  Recent Lipid Panel    Component Value Date/Time   CHOL 199 04/30/2023 0948   TRIG 181 (H) 04/30/2023 0948   HDL 55 04/30/2023 0948   CHOLHDL 3.6 04/30/2023 0948   LDLCALC 113 (H) 04/30/2023 0948    Physical Exam:    VS:  BP (!) 140/80   Pulse 80   Ht 5' 4 (1.626 m)   Wt 183 lb 3.2 oz (83.1 kg)   SpO2 97%   BMI 31.45 kg/m     Wt Readings from Last 3 Encounters:  05/27/24 183 lb 3.2 oz (83.1 kg)  04/20/24 184 lb 6.4 oz (83.6 kg)  02/26/24 181 lb 12.8 oz (82.5 kg)     GEN: Patient is in no acute distress HEENT: Normal NECK: No JVD; No carotid bruits LYMPHATICS: No  lymphadenopathy CARDIAC: Hear sounds regular, 2/6 systolic murmur at the apex. RESPIRATORY:  Clear to auscultation without rales, wheezing or rhonchi  ABDOMEN: Soft, non-tender, non-distended MUSCULOSKELETAL:  No edema; No deformity  SKIN: Warm and dry NEUROLOGIC:  Alert and oriented x 3 PSYCHIATRIC:  Normal affect   Signed, Jennifer JONELLE Crape, MD  05/27/2024 9:13 AM    [facility]     [1]  Current Meds  Medication Sig   acetaminophen  (TYLENOL ) 325 MG tablet Take 2 tablets (650 mg total) by mouth every 4 (four) hours as needed for headache or mild pain (pain score 1-3).   amLODipine  (NORVASC ) 5 MG tablet Take 1 tablet (5 mg total) by mouth daily.   Cholecalciferol (VITAMIN D3 PO) Take 1,000 Units by mouth in the morning.   cloNIDine  (CATAPRES ) 0.2 MG tablet Take 0.2 mg by mouth at bedtime.   Docusate Calcium (STOOL SOFTENER PO) Take 1 tablet by mouth daily.   EPINEPHrine  0.3 mg/0.3 mL IJ SOAJ injection Use as directed for life threatening allergic reactions.   Ferrous Sulfate (IRON) 325 (65 Fe) MG TABS Take 1 tablet by mouth daily.   furosemide  (LASIX ) 20 MG tablet Take 1 tablet (20 mg total) by mouth daily.   ipratropium (ATROVENT ) 0.06 % nasal spray CAN USE TWO SPRAYS IN EACH NOSTRIL EVERY SIX HOURS TO DRY UP RUNNY NOSE.   magnesium oxide (MAG-OX) 400 (240 Mg) MG tablet Take 400 mg by mouth at bedtime.   methenamine  (HIPREX ) 1 g tablet Take 1 tablet (1 g total) by mouth 2 (two) times daily with a meal. Take this tablet twice a day along with 500 mg of vit C each time   metoprolol  tartrate (LOPRESSOR ) 25 MG tablet Take 2 tablets (50 mg total) by mouth 2 (two) times daily.   montelukast (SINGULAIR) 10 MG tablet Take 10 mg by mouth at bedtime.   omeprazole (PRILOSEC) 40 MG capsule Take 40 mg by mouth in the morning.   potassium chloride  SA (KLOR-CON  M) 20 MEQ tablet Take 1 tablet (20 mEq total) by mouth 2 (two) times daily.   pravastatin  (PRAVACHOL ) 40 MG  tablet  Take 1 tablet (40 mg total) by mouth daily.   Probiotic Product (DAILY PROBIOTIC PO) Take 1 tablet by mouth at bedtime. Women's Probiotic   vitamin C (ASCORBIC ACID) 500 MG tablet Take 500 mg by mouth daily.   XARELTO  20 MG TABS tablet TAKE 1 TABLET BY MOUTH DAILY WITH SUPPER.   Zinc Acetate 50 MG CAPS Take 1 capsule by mouth daily.   "

## 2024-05-27 NOTE — Telephone Encounter (Signed)
 Spoke to patient as a REMINDER about her STRESS TEST ON 06/02/24 AT 11:00.

## 2024-05-28 ENCOUNTER — Ambulatory Visit: Payer: Self-pay | Admitting: Cardiology

## 2024-05-28 LAB — COMPREHENSIVE METABOLIC PANEL WITH GFR
ALT: 11 [IU]/L (ref 0–32)
AST: 18 [IU]/L (ref 0–40)
Albumin: 4.1 g/dL (ref 3.7–4.7)
Alkaline Phosphatase: 124 [IU]/L (ref 48–129)
BUN/Creatinine Ratio: 19 (ref 12–28)
BUN: 17 mg/dL (ref 8–27)
Bilirubin Total: 0.4 mg/dL (ref 0.0–1.2)
CO2: 22 mmol/L (ref 20–29)
Calcium: 9.4 mg/dL (ref 8.7–10.3)
Chloride: 102 mmol/L (ref 96–106)
Creatinine, Ser: 0.9 mg/dL (ref 0.57–1.00)
Globulin, Total: 2.4 g/dL (ref 1.5–4.5)
Glucose: 114 mg/dL — ABNORMAL HIGH (ref 70–99)
Potassium: 4.1 mmol/L (ref 3.5–5.2)
Sodium: 141 mmol/L (ref 134–144)
Total Protein: 6.5 g/dL (ref 6.0–8.5)
eGFR: 63 mL/min/{1.73_m2}

## 2024-05-28 LAB — LIPID PANEL
Chol/HDL Ratio: 3.8 ratio (ref 0.0–4.4)
Cholesterol, Total: 163 mg/dL (ref 100–199)
HDL: 43 mg/dL
LDL Chol Calc (NIH): 90 mg/dL (ref 0–99)
Triglycerides: 175 mg/dL — ABNORMAL HIGH (ref 0–149)
VLDL Cholesterol Cal: 30 mg/dL (ref 5–40)

## 2024-05-28 LAB — CBC
Hematocrit: 42.2 % (ref 34.0–46.6)
Hemoglobin: 13.5 g/dL (ref 11.1–15.9)
MCH: 27.3 pg (ref 26.6–33.0)
MCHC: 32 g/dL (ref 31.5–35.7)
MCV: 85 fL (ref 79–97)
Platelets: 231 10*3/uL (ref 150–450)
RBC: 4.94 x10E6/uL (ref 3.77–5.28)
RDW: 12.8 % (ref 11.7–15.4)
WBC: 5.3 10*3/uL (ref 3.4–10.8)

## 2024-05-28 LAB — TSH: TSH: 4.49 u[IU]/mL (ref 0.450–4.500)

## 2024-05-28 LAB — HEMOGLOBIN A1C
Est. average glucose Bld gHb Est-mCnc: 134 mg/dL
Hgb A1c MFr Bld: 6.3 % — ABNORMAL HIGH (ref 4.8–5.6)

## 2024-05-28 LAB — VITAMIN D 25 HYDROXY (VIT D DEFICIENCY, FRACTURES): Vit D, 25-Hydroxy: 43 ng/mL (ref 30.0–100.0)

## 2024-06-02 ENCOUNTER — Ambulatory Visit

## 2024-06-04 ENCOUNTER — Ambulatory Visit: Admitting: Internal Medicine

## 2024-07-09 ENCOUNTER — Encounter

## 2024-10-08 ENCOUNTER — Encounter

## 2025-01-07 ENCOUNTER — Encounter

## 2025-04-19 ENCOUNTER — Ambulatory Visit: Admitting: Allergy and Immunology
